# Patient Record
Sex: Male | Born: 1950 | Race: White | Hispanic: No | Smoking: Current some day smoker
Health system: Southern US, Community
[De-identification: ages and names within clinical notes are randomized; demographics above are authoritative.]

## PROBLEM LIST (undated history)

## (undated) DIAGNOSIS — I10 Essential (primary) hypertension: Secondary | ICD-10-CM

## (undated) DIAGNOSIS — R7303 Prediabetes: Secondary | ICD-10-CM

## (undated) DIAGNOSIS — M549 Dorsalgia, unspecified: Secondary | ICD-10-CM

## (undated) DIAGNOSIS — M2550 Pain in unspecified joint: Secondary | ICD-10-CM

## (undated) DIAGNOSIS — R0602 Shortness of breath: Secondary | ICD-10-CM

## (undated) DIAGNOSIS — R5383 Other fatigue: Secondary | ICD-10-CM

## (undated) DIAGNOSIS — R040 Epistaxis: Secondary | ICD-10-CM

## (undated) DIAGNOSIS — R9431 Abnormal electrocardiogram [ECG] [EKG]: Secondary | ICD-10-CM

## (undated) DIAGNOSIS — M1990 Unspecified osteoarthritis, unspecified site: Secondary | ICD-10-CM

## (undated) DIAGNOSIS — H3320 Serous retinal detachment, unspecified eye: Secondary | ICD-10-CM

## (undated) DIAGNOSIS — I517 Cardiomegaly: Secondary | ICD-10-CM

## (undated) HISTORY — DX: Dorsalgia, unspecified: M54.9

## (undated) HISTORY — DX: Epistaxis: R04.0

## (undated) HISTORY — DX: Essential (primary) hypertension: I10

## (undated) HISTORY — DX: Pain in unspecified joint: M25.50

## (undated) HISTORY — DX: Cardiomegaly: I51.7

## (undated) HISTORY — DX: Serous retinal detachment, unspecified eye: H33.20

## (undated) HISTORY — DX: Abnormal electrocardiogram (ECG) (EKG): R94.31

## (undated) HISTORY — DX: Other fatigue: R53.83

## (undated) HISTORY — DX: Unspecified osteoarthritis, unspecified site: M19.90

## (undated) HISTORY — DX: Shortness of breath: R06.02

## (undated) HISTORY — DX: Prediabetes: R73.03

---

## 2007-07-11 HISTORY — PX: CATARACT EXTRACTION: SUR2

## 2007-07-11 HISTORY — PX: EYE SURGERY: SHX253

## 2009-12-20 ENCOUNTER — Emergency Department (HOSPITAL_COMMUNITY): Admission: EM | Admit: 2009-12-20 | Discharge: 2009-12-20 | Payer: Self-pay | Admitting: Emergency Medicine

## 2009-12-21 ENCOUNTER — Ambulatory Visit: Payer: Self-pay | Admitting: Family Medicine

## 2009-12-21 DIAGNOSIS — R9431 Abnormal electrocardiogram [ECG] [EKG]: Secondary | ICD-10-CM

## 2009-12-21 DIAGNOSIS — I10 Essential (primary) hypertension: Secondary | ICD-10-CM

## 2009-12-21 DIAGNOSIS — R040 Epistaxis: Secondary | ICD-10-CM | POA: Insufficient documentation

## 2009-12-21 HISTORY — DX: Epistaxis: R04.0

## 2009-12-21 HISTORY — DX: Abnormal electrocardiogram (ECG) (EKG): R94.31

## 2009-12-21 HISTORY — DX: Essential (primary) hypertension: I10

## 2009-12-22 ENCOUNTER — Telehealth: Payer: Self-pay | Admitting: Family Medicine

## 2009-12-27 ENCOUNTER — Encounter: Payer: Self-pay | Admitting: Family Medicine

## 2009-12-27 ENCOUNTER — Ambulatory Visit (HOSPITAL_COMMUNITY): Admission: RE | Admit: 2009-12-27 | Discharge: 2009-12-27 | Payer: Self-pay | Admitting: Family Medicine

## 2009-12-27 ENCOUNTER — Ambulatory Visit: Payer: Self-pay | Admitting: Cardiology

## 2009-12-27 ENCOUNTER — Ambulatory Visit: Payer: Self-pay

## 2010-01-04 ENCOUNTER — Ambulatory Visit: Payer: Self-pay | Admitting: Family Medicine

## 2010-01-04 DIAGNOSIS — I517 Cardiomegaly: Secondary | ICD-10-CM | POA: Insufficient documentation

## 2010-01-04 HISTORY — DX: Cardiomegaly: I51.7

## 2010-01-06 ENCOUNTER — Encounter: Payer: Self-pay | Admitting: Family Medicine

## 2010-02-07 ENCOUNTER — Ambulatory Visit: Payer: Self-pay | Admitting: Family Medicine

## 2010-02-14 ENCOUNTER — Ambulatory Visit: Payer: Self-pay | Admitting: Family Medicine

## 2010-02-14 LAB — CONVERTED CEMR LAB
ALT: 29 units/L (ref 0–53)
AST: 33 units/L (ref 0–37)
Alkaline Phosphatase: 44 units/L (ref 39–117)
Basophils Relative: 0.5 % (ref 0.0–3.0)
Chloride: 105 meq/L (ref 96–112)
Eosinophils Relative: 1.3 % (ref 0.0–5.0)
HCT: 40.6 % (ref 39.0–52.0)
LDL Cholesterol: 109 mg/dL — ABNORMAL HIGH (ref 0–99)
Lymphs Abs: 2.1 10*3/uL (ref 0.7–4.0)
MCV: 90.5 fL (ref 78.0–100.0)
Monocytes Absolute: 0.7 10*3/uL (ref 0.1–1.0)
Monocytes Relative: 8.5 % (ref 3.0–12.0)
Neutrophils Relative %: 62.4 % (ref 43.0–77.0)
Nitrite: NEGATIVE
PSA: 1.05 ng/mL (ref 0.10–4.00)
Potassium: 4 meq/L (ref 3.5–5.1)
RBC: 4.49 M/uL (ref 4.22–5.81)
Total Bilirubin: 1 mg/dL (ref 0.3–1.2)
Total CHOL/HDL Ratio: 4
Urobilinogen, UA: 0.2
WBC Urine, dipstick: NEGATIVE
WBC: 7.7 10*3/uL (ref 4.5–10.5)

## 2010-02-18 ENCOUNTER — Ambulatory Visit: Payer: Self-pay | Admitting: Family Medicine

## 2010-08-06 ENCOUNTER — Encounter: Payer: Self-pay | Admitting: Family Medicine

## 2010-08-09 NOTE — Letter (Signed)
Summary: ENT-Dr. Narda Bonds  ENT-Dr. Narda Bonds   Imported By: Maryln Gottron 03/02/2010 15:18:03  _____________________________________________________________________  External Attachment:    Type:   Image     Comment:   External Document

## 2010-08-09 NOTE — Assessment & Plan Note (Signed)
Summary: cpx/njr   Vital Signs:  Patient profile:   60 year old male Height:      70.5 inches Weight:      250 pounds Temp:     98.3 degrees F oral Pulse rate:   68 / minute Pulse rhythm:   regular Resp:     16 per minute BP sitting:   130 / 88  (left arm) Cuff size:   regular  Vitals Entered By: Duard Brady LPN (February 18, 2010 11:23 AM) CC: cpx - doing well Is Patient Diabetic? No    History of Present Illness: Here for CPE.  No hx of colonoscopy. Last tetanus > 10 years ago. Exercise with walking since dx several weeks ago of severe hypertension.  Preventive Screening-Counseling & Management  Alcohol-Tobacco     Smoking Status: current  Clinical Review Panels:  Prevention   Last PSA:  1.05 (02/14/2010)  Lipid Management   Cholesterol:  169 (02/14/2010)   LDL (bad choesterol):  109 (02/14/2010)   HDL (good cholesterol):  38.30 (02/14/2010)  CBC   WBC:  7.7 (02/14/2010)   RBC:  4.49 (02/14/2010)   Hgb:  14.0 (02/14/2010)   Hct:  40.6 (02/14/2010)   Platelets:  304.0 (02/14/2010)   MCV  90.5 (02/14/2010)   MCHC  34.5 (02/14/2010)   RDW  13.4 (02/14/2010)   PMN:  62.4 (02/14/2010)   Lymphs:  27.3 (02/14/2010)   Monos:  8.5 (02/14/2010)   Eosinophils:  1.3 (02/14/2010)   Basophil:  0.5 (02/14/2010)  Complete Metabolic Panel   Glucose:  105 (02/14/2010)   Sodium:  143 (02/14/2010)   Potassium:  4.0 (02/14/2010)   Chloride:  105 (02/14/2010)   CO2:  29 (02/14/2010)   BUN:  23 (02/14/2010)   Creatinine:  1.1 (02/14/2010)   Albumin:  4.0 (02/14/2010)   Total Protein:  6.6 (02/14/2010)   Calcium:  9.1 (02/14/2010)   Total Bili:  1.0 (02/14/2010)   Alk Phos:  44 (02/14/2010)   SGPT (ALT):  29 (02/14/2010)   SGOT (AST):  33 (02/14/2010)   Allergies: 1)  Penicillin V Potassium (Penicillin V Potassium)  Past History:  Past Medical History: Last updated: 01/04/2010 Hay fever/ Allergies HBP LVH by ECHO 6/11  Past Surgical History: Last  updated: 12/21/2009 Cataract 2009  Family History: Last updated: 12/21/2009 Paternal grendmother, ovarian CA Mother, bladder CA, hypertension Father, hypertension, heart disease  36s Both grandfathers, heart disease, hypertension  Social History: Last updated: 12/21/2009 Occupation:  Systems developer Married Alcohol use-yes Regular exercise-yes Quit smoking  Risk Factors: Exercise: yes (12/21/2009)  Risk Factors: Smoking Status: current (02/18/2010)  Social History: Smoking Status:  current  Review of Systems  The patient denies anorexia, fever, weight gain, vision loss, decreased hearing, hoarseness, chest pain, syncope, dyspnea on exertion, peripheral edema, prolonged cough, headaches, hemoptysis, abdominal pain, melena, hematochezia, severe indigestion/heartburn, hematuria, incontinence, genital sores, muscle weakness, suspicious skin lesions, transient blindness, difficulty walking, depression, unusual weight change, abnormal bleeding, enlarged lymph nodes, and testicular masses.         some weight loss due to his efforts.  Physical Exam  General:  Well-developed,well-nourished,in no acute distress; alert,appropriate and cooperative throughout examination Head:  Normocephalic and atraumatic without obvious abnormalities. No apparent alopecia or balding. Eyes:  No corneal or conjunctival inflammation noted. EOMI. Perrla. Funduscopic exam benign, without hemorrhages, exudates or papilledema. Vision grossly normal. Ears:  External ear exam shows no significant lesions or deformities.  Otoscopic examination reveals clear canals, tympanic membranes are intact bilaterally  without bulging, retraction, inflammation or discharge. Hearing is grossly normal bilaterally. Mouth:  Oral mucosa and oropharynx without lesions or exudates.  Teeth in good repair. Neck:  No deformities, masses, or tenderness noted. Lungs:  Normal respiratory effort, chest expands symmetrically. Lungs are  clear to auscultation, no crackles or wheezes. Heart:  Normal rate and regular rhythm. S1 and S2 normal without gallop, murmur, click, rub or other extra sounds. Abdomen:  Bowel sounds positive,abdomen soft and non-tender without masses, organomegaly or hernias noted. Rectal:  No external abnormalities noted. Normal sphincter tone. No rectal masses or tenderness. Genitalia:  Testes bilaterally descended without nodularity, tenderness or masses. No scrotal masses or lesions. No penis lesions or urethral discharge. Prostate:  Prostate gland firm and smooth, no enlargement, nodularity, tenderness, mass, asymmetry or induration. Msk:  No deformity or scoliosis noted of thoracic or lumbar spine.   Extremities:  No clubbing, cyanosis, edema, or deformity noted with normal full range of motion of all joints.   Neurologic:  alert & oriented X3 and cranial nerves II-XII intact.   Skin:  Intact without suspicious lesions or rashes Cervical Nodes:  No lymphadenopathy noted Psych:  Oriented X3, good eye contact, not anxious appearing, and not depressed appearing.     Impression & Recommendations:  Problem # 1:  Preventive Health Care (ICD-V70.0) labs reviewed with pt.  Tdap given.  Discussed and recommended colonoscopy screening.  He is not sure at this time because of cost. He does agree to hemoccult cards and these are given.  Cont weight loss efforts. His BP has improved.  Complete Medication List: 1)  Metoprolol Tartrate 50 Mg Tabs (Metoprolol tartrate) .... Once daily 2)  Hydrochlorothiazide 25 Mg Tabs (Hydrochlorothiazide) .... Once daily 3)  Lisinopril 20 Mg Tabs (Lisinopril) .... One by mouth once daily  Other Orders: Tdap => 7yrs IM (04540) Admin 1st Vaccine (98119)  Patient Instructions: 1)  Please schedule a follow-up appointment in 6 months .  2)  It is important that you exercise reguarly at least 20 minutes 5 times a week. If you develop chest pain, have severe difficulty breathing,  or feel very tired, stop exercising immediately and seek medical attention.  3)  You need to lose weight. Consider a lower calorie diet and regular exercise.  4)  Check your  Blood Pressure regularly . If it is above:140/90   you should make an appointment.    Immunizations Administered:  Tetanus Vaccine:    Vaccine Type: Tdap    Site: right deltoid    Mfr: Gl[employer]oSmithK[employer]    Dose: 0.5 ml    Route: IM    Given by: Duard Brady LPN    Exp. Date: 01/07/2012    Lot #: [id number]    VIS given: 05/28/07 version given February 18, 2010.    Physician counseled: yes

## 2010-08-09 NOTE — Assessment & Plan Note (Signed)
Summary: 2 wks rov/mm   Vital Signs:  Patient profile:   60 year old male Temp:     98.0 degrees F oral BP sitting:   170 / 100  (left arm) Cuff size:   large  Vitals Entered By: Sid Falcon LPN (January 04, 2010 11:01 AM)  Serial Vital Signs/Assessments:  Time      Position  BP       Pulse  Resp  Temp     By                     162/94                         Evelena Peat MD  CC: 2 week F/U on new BP meds   History of Present Illness: Follow up hypertension. Refer to recent note.  Pt presented with severe hypertension, EKG changes with prob LVH and repolarization changes.  Also freq posterior nosebleeds.  Pt started on HCTZ and metoprolol prior to being seen here.  We added lisinopril last visit. ECHO revealed LVH changes but no signif  valvular problems or wall motion abnormalities.  Pt denies chest pain at this time.  compliant with meds with no side effects. No cough.  No recent nose bleeds.  holding ASA at this time.  Saw ENT and no site of nose bleed located. [place].  Has started some walking as has scaled back NSAID and ETOH use.  Allergies: 1)  Penicillin V Potassium (Penicillin V Potassium)  Past History:  Past Surgical History: Last updated: 12/21/2009 Cataract 2009  Family History: Last updated: 12/21/2009 Paternal grendmother, ovarian CA Mother, bladder CA, hypertension Father, hypertension, heart disease  42s Both grandfathers, heart disease, hypertension  Social History: Last updated: 12/21/2009 Occupation:  Systems developer Married Alcohol use-yes Regular exercise-yes Quit smoking  Risk Factors: Exercise: yes (12/21/2009)  Past Medical History: Hay fever/ Allergies HBP LVH by ECHO 6/11 PMH-FH-SH reviewed for relevance  Review of Systems  The patient denies weight loss, weight gain, chest pain, syncope, dyspnea on exertion, peripheral edema, prolonged cough, headaches, hemoptysis, abdominal  pain, melena, hematochezia, and severe indigestion/heartburn.    Physical Exam  General:  Well-developed,well-nourished,in no acute distress; alert,appropriate and cooperative throughout examination Nose:  External nasal examination shows no deformity or inflammation. Nasal mucosa are pink and moist without lesions or exudates. Neck:  No deformities, masses, or tenderness noted. Lungs:  Normal respiratory effort, chest expands symmetrically. Lungs are clear to auscultation, no crackles or wheezes. Heart:  Normal rate and regular rhythm. S1 and S2 normal without gallop, murmur, click, rub or other extra sounds. Extremities:  no edema.   Impression & Recommendations:  Problem # 1:  ESSENTIAL HYPERTENSION (ICD-401.9) improved but not to goal.  Titrate lisinopril to 20 mg once daily and reasess in 3 weeks and check BMP then. His updated medication list for this problem includes:    Metoprolol Tartrate 50 Mg Tabs (Metoprolol tartrate) ..... Once daily    Hydrochlorothiazide 25 Mg Tabs (Hydrochlorothiazide) ..... Once daily    Lisinopril 20 Mg Tabs (Lisinopril) ..... 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  Complete Medication List: 1)  Metoprolol Tartrate 50 Mg Tabs (Metoprolol tartrate) .... Once daily 2)  Hydrochlorothiazide 25 Mg Tabs (Hydrochlorothiazide) .Marland KitchenMarland KitchenMarland Kitchen  Once daily 3)  Lisinopril 20 Mg Tabs (Lisinopril) .... One by mouth once daily   Patient Instructions: 1)  Increase lisinopril to 20 mg once daily  2)  It is important that you exercise reguarly at least 20 minutes 5 times a week. If you develop chest pain, have severe difficulty breathing, or feel very tired, stop exercising immediately and seek medical attention.  3)  You need to lose weight. Consider a lower calorie diet and regular exercise.    4)  Consider getting back on Aspirin 81 mg daily when OKed by Dr Ezzard Standing. 5)  Please schedule a follow-up appointment in 1 month.  Prescriptions: LISINOPRIL 20 MG TABS (LISINOPRIL) one by mouth once daily  #30 x 11   Entered and Authorized by:   Evelena Peat MD   Signed by:   Evelena Peat MD on 01/04/2010   Method used:   Electronically to        [facility] [address] [phone]* (retail)       [address]       [place], [place]  [address]       Ph: [phone] or [phone]       Fax: [phone]   RxID:   [id number]

## 2010-08-09 NOTE — Progress Notes (Signed)
Summary: BP 230/118  Phone Note Call from Patient   Caller: Patient Call For: Evelena Peat MD Summary of Call: Pt. called to let Dr. Caryl Never know he has had 3 more nose bleeds, 2 were short lived, and one was the same as before.  He still feels fine...no dizziness, sob, etc........ [phone] Initial call taken by: Lynann Beaver CMA,  December 22, 2009 9:39 AM  Follow-up for Phone Call        Pt called back, and his nose has just started bleeding again. Asking if he should come to the office? Follow-up by: Lynann Beaver CMA,  December 22, 2009 11:30 AM  Additional Follow-up for Phone Call Additional follow up Details #1::        make sure he has STOPPED aspirin.  If nose bleed not stoppping with pressure I think we need to get him into ENT since this was a posterior bleed which tend to be much more difficult to manage. Additional Follow-up by: Evelena Peat MD,  December 22, 2009 11:45 AM    Additional Follow-up for Phone Call Additional follow up Details #2::    Yes, has stopped the ASA.  BP this am at nursing home with his mother, trying to decide about hospitalizing her & stress with that, 238/118.  Has had the one nosebleed today & wants tp gp ahead with ENT because he feels the blood towards back & draining into throat more the nose.  HCTZ & Metoprolol for BP.   Follow-up by: Rudy Jew, RN,  December 22, 2009 12:21 PM  Additional Follow-up for Phone Call Additional follow up Details #3:: Details for Additional Follow-up Action Taken: At this point, I would add lisinopril 10 mg by mouth once daily and proceed with ENT referral. Make sure pt has f/u in 2 weeks. Additional Follow-up by: Evelena Peat MD,  December 22, 2009 5:31 PM  New/Updated Medications: LISINOPRIL 10 MG TABS (LISINOPRIL) One daily Prescriptions: LISINOPRIL 10 MG TABS (LISINOPRIL) One daily  #30 x 0   Entered by:   Rudy Jew, RN   Authorized by:   Evelena Peat MD   Signed by:   Rudy Jew,  RN on 12/22/2009   Method used:   Electronically to        [facility] [address] [phone]* (retail)       [address]       [place], [place]  [address]       Ph: [phone] or [phone]       Fax: [phone]   RxID:   [id number]

## 2010-08-09 NOTE — Assessment & Plan Note (Signed)
Summary: BRAND NEW PT/TO EST/SEVERE NOSE BLEEDS/HIGH BP/CJR   Vital Signs:  Patient profile:   60 year old male Height:      70.50 inches Weight:      256 pounds BMI:     36.34 Temp:     98.6 degrees F oral Pulse rate:   72 / minute Pulse rhythm:   regular Resp:     12 per minute BP sitting:   162 / 98  (left arm) Cuff size:   large  Vitals Entered By: Sid Falcon LPN (December 21, 2009 11:20 AM)  Nutrition Counseling: Patient's BMI is greater than 25 and therefore counseled on weight management options.  Serial Vital Signs/Assessments:  Time      Position  BP       Pulse  Resp  Temp     By 11:38 AM            158/92                         Sid Falcon LPN  CC: New to establish,  nose bleeds, HBP   History of Present Illness: New pt to establish care.  Patient relates hx of high blood pressure off and on for years but never treated with meds until recently.  Recent trip to [place] and had signif L nose bleed.  Went to ER in [place] and had nose packed and BP noted to be up but pt not placed on any meds.  BP up in ER in 200/100 range and EKG done which showed some repolarization changes and possible LVH changes.   Pt returned home and over past weekend recurrent L nose bleed and pt had Bp 230/100.  Nosebleed was brief.  No recurrent packing.  Pt reportedly given Clonidine in ER (here in [place]) and started on HCTZ and Metoprolol.  Bp has improved since starting those meds.  No further bleeds since then.  Denies headaches, dizziness, chest pains, dypnea.  Taking 81 mg ASA daily.  Nonsmoker.  Strong FH of hypertension.  Drinks 1-2 alcoholic drinks per day.   Preventive Screening-Counseling & Management  Caffeine-Diet-Exercise     Does Patient Exercise: yes  Allergies (verified): 1)  Penicillin V Potassium (Penicillin V Potassium)  Past History:  Family History: Last updated: 12/21/2009 Paternal grendmother, ovarian CA Mother, bladder CA,  hypertension Father, hypertension, heart disease  51s Both grandfathers, heart disease, hypertension  Social History: Last updated: 12/21/2009 Occupation:  Systems developer Married Alcohol use-yes Regular exercise-yes Quit smoking  Risk Factors: Exercise: yes (12/21/2009)  Past Medical History: Hay fever/ Allergies HBP  Past Surgical History: Cataract 2009  Family History: Paternal grendmother, ovarian CA Mother, bladder CA, hypertension Father, hypertension, heart disease  28s Both grandfathers, heart disease, hypertension  Social History: Occupation:  Systems developer Married Alcohol use-yes Regular exercise-yes Quit smoking Occupation:  employed Does Patient Exercise:  yes  Review of Systems  The patient denies anorexia, fever, weight loss, weight gain, vision loss, chest pain, syncope, dyspnea on exertion, peripheral edema, prolonged cough, headaches, hemoptysis, abdominal pain, severe indigestion/heartburn, and muscle weakness.    Physical Exam  General:  pt is alert, cooperative and moderately obese. Eyes:  No corneal or conjunctival inflammation noted. EOMI. Perrla. Funduscopic exam benign, without hemorrhages, exudates or papilledema. Vision grossly normal. Ears:  External ear exam shows no significant lesions or deformities.  Otoscopic examination reveals clear canals, tympanic membranes are intact bilaterally without bulging, retraction, inflammation or discharge. Hearing  is grossly normal bilaterally. Nose:  no clotting or active bleeding. Mouth:  Oral mucosa and oropharynx without lesions or exudates.  Teeth in good repair. Neck:  No deformities, masses, or tenderness noted. Lungs:  Normal respiratory effort, chest expands symmetrically. Lungs are clear to auscultation, no crackles or wheezes. Heart:  Normal rate and regular rhythm. S1 and S2 normal without gallop, murmur, click, rub or other extra sounds. Extremities:  no edema. Neurologic:  alert &  oriented X3 and cranial nerves II-XII intact.   Psych:  normally interactive, good eye contact, not anxious appearing, and not depressed appearing.     Impression & Recommendations:  Problem # 1:  ESSENTIAL HYPERTENSION (ICD-401.9) Assessment New improved since starting med just 2 days ago but not to goal.  REassess in 2 weeks. His updated medication list for this problem includes:    Metoprolol Tartrate 50 Mg Tabs (Metoprolol tartrate) ..... Once daily    Hydrochlorothiazide 25 Mg Tabs (Hydrochlorothiazide) ..... Once daily  Problem # 2:  EPISTAXIS, RECURRENT (ICD-784.7) no bleeding at this time.  Hold ASA for one week.  Prob post nose bleed. Consider ENT referral if recurs.  Problem # 3:  ABNORMAL ELECTROCARDIOGRAM (ICD-794.31) ?LVH by EKG with repolarization changes.  Will check Echo. Orders: Echo Referral (Echo)  Complete Medication List: 1)  Metoprolol Tartrate 50 Mg Tabs (Metoprolol tartrate) .... Once daily 2)  Hydrochlorothiazide 25 Mg Tabs (Hydrochlorothiazide) .... Once daily  Patient Instructions: 1)  Hold aspirin for now. 2)  Schedule follow up in 2 weeks. 3)  Call for any recurrent nose bleeds. Prescriptions: HYDROCHLOROTHIAZIDE 25 MG TABS (HYDROCHLOROTHIAZIDE) once daily  #30 x 5   Entered and Authorized by:   Evelena Peat MD   Signed by:   Evelena Peat MD on 12/21/2009   Method used:   Electronically to        [facility] [address] [phone]* (retail)       [address]       [place], [place]  [address]       Ph: [phone] or [phone]       Fax: [phone]   RxID:   [id number] METOPROLOL TARTRATE 50 MG TABS (METOPROLOL TARTRATE) once daily  #30 x 5   Entered and Authorized by:   Evelena Peat MD   Signed by:   Evelena Peat MD on 12/21/2009   Method used:   Electronically to        [facility] [address] [phone]* (retail)       [address]       [place], [place]  [address]       Ph: [phone]  or [phone]       Fax: [phone]   RxID:   [id number]

## 2010-08-09 NOTE — Assessment & Plan Note (Signed)
Summary: Follow up/cb   Vital Signs:  Patient profile:   60 year old male Temp:     98.0 degrees F oral BP sitting:   120 / 84  (left arm) Cuff size:   large  Vitals Entered By: Sid Falcon LPN (February 07, 2010 11:06 AM) CC: one month follow-up   History of Present Illness: Patient here for followup hypertension and left ventricular hypertrophy. Overall feels much better. Sleeping is improved. Denies any dizziness or chest pains. Tolerating medications without side effects. Compliant with all medications.  No further nose bleeds since onset several years ago.  Nonsmoker.  Not exercising regularly.  Allergies: 1)  Penicillin V Potassium (Penicillin V Potassium)  Past History:  Past Medical History: Last updated: 01/04/2010 Hay fever/ Allergies HBP LVH by ECHO 6/11 PMH reviewed for relevance  Review of Systems  The patient denies chest pain, syncope, dyspnea on exertion, peripheral edema, prolonged cough, and headaches.    Physical Exam  General:  Well-developed,well-nourished,in no acute distress; alert,appropriate and cooperative throughout examination Neck:  No deformities, masses, or tenderness noted. Lungs:  Normal respiratory effort, chest expands symmetrically. Lungs are clear to auscultation, no crackles or wheezes. Heart:  Normal rate and regular rhythm. S1 and S2 normal without gallop, murmur, click, rub or other extra sounds. Extremities:  no edema.   Impression & Recommendations:  Problem # 1:  ESSENTIAL HYPERTENSION (ICD-401.9) Assessment Improved cont current meds and work on weight loss and establishing more regular exercise.  Check labs in 4 months including lipids. His updated medication list for this problem includes:    Metoprolol Tartrate 50 Mg Tabs (Metoprolol tartrate) ..... Once daily    Hydrochlorothiazide 25 Mg Tabs (Hydrochlorothiazide) ..... Once daily    Lisinopril 20 Mg Tabs (Lisinopril) ..... One by mouth once daily  Complete  Medication List: 1)  Metoprolol Tartrate 50 Mg Tabs (Metoprolol tartrate) .... Once daily 2)  Hydrochlorothiazide 25 Mg Tabs (Hydrochlorothiazide) .... Once daily 3)  Lisinopril 20 Mg Tabs (Lisinopril) .... One by mouth once daily  Patient Instructions: 1)  Please schedule a follow-up appointment in 4 months .  2)  BMP prior to visit, ICD-9: 401.9 3)  Hepatic Panel prior to visit ICD-9: 272.4 4)  Lipid panel prior to visit ICD-9 : 272.4 5)  It is important that you exercise reguarly at least 20 minutes 5 times a week. If you develop chest pain, have severe difficulty breathing, or feel very tired, stop exercising immediately and seek medical attention.  6)  You need to lose weight. Consider a lower calorie diet and regular exercise.  7)  Check your  Blood Pressure regularly . If it is above:140/90   you should make an appointment.

## 2010-08-22 ENCOUNTER — Encounter: Payer: Self-pay | Admitting: Family Medicine

## 2010-08-22 ENCOUNTER — Ambulatory Visit (INDEPENDENT_AMBULATORY_CARE_PROVIDER_SITE_OTHER): Payer: 59 | Admitting: Family Medicine

## 2010-08-22 DIAGNOSIS — R7309 Other abnormal glucose: Secondary | ICD-10-CM

## 2010-08-22 DIAGNOSIS — I10 Essential (primary) hypertension: Secondary | ICD-10-CM

## 2010-08-22 DIAGNOSIS — R5381 Other malaise: Secondary | ICD-10-CM

## 2010-08-22 DIAGNOSIS — R7303 Prediabetes: Secondary | ICD-10-CM

## 2010-08-22 DIAGNOSIS — R5383 Other fatigue: Secondary | ICD-10-CM

## 2010-08-22 DIAGNOSIS — I517 Cardiomegaly: Secondary | ICD-10-CM

## 2010-08-22 NOTE — Progress Notes (Signed)
  Subjective:    Patient ID: Samuel [name], male    DOB: Nov 16, 1950, 60 y.o.   MRN: [id number]  HPI  Patient seen for followup of hypertension and left ventricular hypertrophy. He presented last year with severe nosebleeds but none since then. Medications are reviewed. He remains on lisinopril, hydrochlorothiazide, and metoprolol. Blood pressures have been well controlled by home readings. Walks about 1 mile per day. Minimal weight loss since last year. Former smoker. Continues to drink about 2 occasionally more alcoholic beverages per day.     patient has concerns of possible hypogonadism. Low stamina and frequent fatigue. Occasional low libido.    patient has history of prediabetes by prior lab work at complete physical in August. He would like to have this reassessed. No symptoms of hyperglycemia. Modest weight loss since physical.   Review of Systems  patient denies any chest pains, dizziness, dyspnea with exertion, peripheral edema, headaches, palpitations , prolonged cough or any change in urine or stool habits.      Objective:   Physical Exam     Patient is alert and in no distress  oropharynx is clear Neck no mass  Chest good auscultation Heart regular rhythm and rate with no murmur Extremities no edema Neuro exam is nonfocal. Cranial nerves II through XII intact.    Assessment & Plan:   #1 hypertension adequately controlled  #2 obesity  #3 history of left ventricular hypertrophy  #4 history of  Pre- diabetes #5 fatigue , rule out hypogonadism

## 2010-08-29 ENCOUNTER — Other Ambulatory Visit: Payer: 59

## 2010-08-29 ENCOUNTER — Other Ambulatory Visit: Payer: Self-pay | Admitting: Family Medicine

## 2010-08-29 ENCOUNTER — Encounter (INDEPENDENT_AMBULATORY_CARE_PROVIDER_SITE_OTHER): Payer: Self-pay | Admitting: *Deleted

## 2010-08-29 DIAGNOSIS — Z0000 Encounter for general adult medical examination without abnormal findings: Secondary | ICD-10-CM

## 2010-08-31 NOTE — Progress Notes (Signed)
Quick Note:  Attempt to call tp - VM on cell# - left msg to call if questions - gave Dr. Mar Daring' instructions r/t testosterone level. ______

## 2011-01-02 DIAGNOSIS — D229 Melanocytic nevi, unspecified: Secondary | ICD-10-CM

## 2011-01-02 HISTORY — DX: Melanocytic nevi, unspecified: D22.9

## 2011-01-07 ENCOUNTER — Other Ambulatory Visit: Payer: Self-pay | Admitting: Family Medicine

## 2011-02-20 ENCOUNTER — Encounter: Payer: Self-pay | Admitting: Podiatry

## 2011-02-20 ENCOUNTER — Encounter: Payer: Self-pay | Admitting: Family Medicine

## 2011-02-20 ENCOUNTER — Ambulatory Visit (INDEPENDENT_AMBULATORY_CARE_PROVIDER_SITE_OTHER): Payer: 59 | Admitting: Family Medicine

## 2011-02-20 DIAGNOSIS — E785 Hyperlipidemia, unspecified: Secondary | ICD-10-CM

## 2011-02-20 DIAGNOSIS — I517 Cardiomegaly: Secondary | ICD-10-CM

## 2011-02-20 DIAGNOSIS — I10 Essential (primary) hypertension: Secondary | ICD-10-CM

## 2011-02-20 DIAGNOSIS — R7303 Prediabetes: Secondary | ICD-10-CM

## 2011-02-20 DIAGNOSIS — R7309 Other abnormal glucose: Secondary | ICD-10-CM

## 2011-02-20 LAB — LIPID PANEL
Cholesterol: 170 mg/dL (ref 0–200)
VLDL: 18.8 mg/dL (ref 0.0–40.0)

## 2011-02-20 LAB — BASIC METABOLIC PANEL
BUN: 15 mg/dL (ref 6–23)
CO2: 28 mEq/L (ref 19–32)
Chloride: 99 mEq/L (ref 96–112)
Glucose, Bld: 117 mg/dL — ABNORMAL HIGH (ref 70–99)
Potassium: 3.9 mEq/L (ref 3.5–5.1)
Sodium: 137 mEq/L (ref 135–145)

## 2011-02-20 NOTE — Progress Notes (Signed)
  Subjective:    Patient ID: Samuel [name], male    DOB: 07-07-1951, 60 y.o.   MRN: [id number]  HPI Medical followup. History of hypertension, left ventricular hypertrophy and prediabetes by labs last year. Excellent job with weight loss. Approximately 256 pounds one year ago now 230 pounds. Walking 1 hour most days of week. Overall feels much better. No dizziness. Denies any chest pains or dyspnea. Medications reviewed. Compliant with all. No side effects. No symptoms of hyperglycemia. Patient is nonsmoker. Did smoke previously and quit 1990.  Frequent epistaxis at dx of hypertension and none since controlled.  Past Medical History  Diagnosis Date  . ESSENTIAL HYPERTENSION 12/21/2009  . VENTRICULAR HYPERTROPHY, LEFT 01/04/2010  . EPISTAXIS, RECURRENT 12/21/2009  . ABNORMAL ELECTROCARDIOGRAM 12/21/2009   Past Surgical History  Procedure Date  . Eye surgery 2009    catarac    reports that he quit smoking about 22 years ago. He does not have any smokeless tobacco history on file. He reports that he drinks alcohol. His drug history not on file. family history includes Cancer in his mother and paternal grandmother; Heart disease in his father, maternal grandfather, and paternal grandfather; and Hypertension in his father, maternal grandfather, mother, and paternal grandfather. Allergies  Allergen Reactions  . Penicillins     REACTION: hives      Review of Systems  Constitutional: Negative for appetite change and fatigue.  Eyes: Negative for visual disturbance.  Respiratory: Negative for cough, chest tightness and shortness of breath.   Cardiovascular: Negative for chest pain, palpitations and leg swelling.  Neurological: Negative for dizziness, syncope, weakness, light-headedness and headaches.  Psychiatric/Behavioral: Negative for dysphoric mood.       Objective:   Physical Exam  Constitutional: He is oriented to person, place, and time. He appears well-developed and well-nourished.    HENT:  Mouth/Throat: Oropharynx is clear and moist.  Eyes: Pupils are equal, round, and reactive to light.  Neck: Normal range of motion. Neck supple. No thyromegaly present.  Cardiovascular: Normal rate, regular rhythm and normal heart sounds.   No murmur heard. Pulmonary/Chest: Effort normal and breath sounds normal. No respiratory distress. He has no wheezes. He has no rales.  Musculoskeletal: He exhibits no edema.  Lymphadenopathy:    He has no cervical adenopathy.  Neurological: He is alert and oriented to person, place, and time.  Psychiatric: He has a normal mood and affect. His behavior is normal.          Assessment & Plan:  #1 hypertension greatly improved. Continue current medications. Check basic metabolic panel #2 history of prediabetes by previous lab work. Hopefully improved with weight loss. Recheck fasting blood sugar today #3 history of left ventricular hypertrophy no dyspnea or other concerning symptoms. #4 hyperlipidemia. Recheck lipids

## 2011-02-21 NOTE — Progress Notes (Signed)
Quick Note:  Pt informed, copy mailed to pt home upon request ______

## 2011-05-07 ENCOUNTER — Other Ambulatory Visit: Payer: Self-pay | Admitting: Family Medicine

## 2011-05-21 ENCOUNTER — Other Ambulatory Visit: Payer: Self-pay | Admitting: Family Medicine

## 2011-05-22 ENCOUNTER — Other Ambulatory Visit: Payer: Self-pay | Admitting: *Deleted

## 2011-05-22 MED ORDER — HYDROCHLOROTHIAZIDE 25 MG PO TABS: 25.0000 mg | ORAL_TABLET | Freq: Every day | ORAL | Status: DC

## 2011-05-22 MED ORDER — METOPROLOL TARTRATE 50 MG PO TABS: 50.0000 mg | ORAL_TABLET | Freq: Every day | ORAL | Status: DC

## 2011-08-23 ENCOUNTER — Ambulatory Visit: Payer: 59 | Admitting: Family Medicine

## 2012-04-28 ENCOUNTER — Other Ambulatory Visit: Payer: Self-pay | Admitting: Family Medicine

## 2012-05-30 ENCOUNTER — Other Ambulatory Visit: Payer: Self-pay | Admitting: Family Medicine

## 2012-06-29 ENCOUNTER — Other Ambulatory Visit: Payer: Self-pay | Admitting: Family Medicine

## 2012-09-29 ENCOUNTER — Other Ambulatory Visit: Payer: Self-pay | Admitting: Family Medicine

## 2012-10-26 ENCOUNTER — Other Ambulatory Visit: Payer: Self-pay | Admitting: Family Medicine

## 2012-10-28 ENCOUNTER — Encounter: Payer: Self-pay | Admitting: *Deleted

## 2012-10-31 ENCOUNTER — Other Ambulatory Visit: Payer: Self-pay | Admitting: Family Medicine

## 2012-11-03 ENCOUNTER — Other Ambulatory Visit: Payer: Self-pay | Admitting: Family Medicine

## 2012-11-11 ENCOUNTER — Other Ambulatory Visit: Payer: Self-pay | Admitting: Family Medicine

## 2012-11-18 ENCOUNTER — Ambulatory Visit (INDEPENDENT_AMBULATORY_CARE_PROVIDER_SITE_OTHER): Payer: BC Managed Care – PPO | Admitting: Family Medicine

## 2012-11-18 ENCOUNTER — Encounter: Payer: Self-pay | Admitting: Family Medicine

## 2012-11-18 VITALS — BP 110/70 | Temp 99.4°F | Wt 246.0 lb

## 2012-11-18 DIAGNOSIS — Z125 Encounter for screening for malignant neoplasm of prostate: Secondary | ICD-10-CM

## 2012-11-18 DIAGNOSIS — E785 Hyperlipidemia, unspecified: Secondary | ICD-10-CM

## 2012-11-18 DIAGNOSIS — I10 Essential (primary) hypertension: Secondary | ICD-10-CM

## 2012-11-18 NOTE — Progress Notes (Signed)
  Subjective:    Patient ID: Samuel [name], male    DOB: January 28, 1951, 62 y.o.   MRN: [id number]  HPI  Followup hypertension Patient takes 3 drug regimen of lisinopril, HCTZ, and metoprolol. When first started several years ago he had some LVH. No chest pains. No dyspnea. Compliant with medications. No consistent exercise. He denies any medication side effects.  Past Medical History  Diagnosis Date  . ESSENTIAL HYPERTENSION 12/21/2009  . VENTRICULAR HYPERTROPHY, LEFT 01/04/2010  . EPISTAXIS, RECURRENT 12/21/2009  . ABNORMAL ELECTROCARDIOGRAM 12/21/2009   Past Surgical History  Procedure Laterality Date  . Eye surgery  2009    catarac    reports that he quit smoking about 24 years ago. He does not have any smokeless tobacco history on file. He reports that  drinks alcohol. His drug history is not on file. family history includes Cancer in his mother and paternal grandmother; Heart disease in his father, maternal grandfather, and paternal grandfather; and Hypertension in his father, maternal grandfather, mother, and paternal grandfather. Allergies  Allergen Reactions  . Penicillins     REACTION: hives     Review of Systems  Constitutional: Negative for fatigue.  Eyes: Negative for visual disturbance.  Respiratory: Negative for cough, chest tightness and shortness of breath.   Cardiovascular: Negative for chest pain, palpitations and leg swelling.  Neurological: Negative for dizziness, syncope, weakness, light-headedness and headaches.       Objective:   Physical Exam  Constitutional: He appears well-developed and well-nourished.  Cardiovascular: Normal rate and regular rhythm.   Pulmonary/Chest: Effort normal and breath sounds normal. No respiratory distress. He has no wheezes. He has no rales.  Musculoskeletal: He exhibits no edema.          Assessment & Plan:  Hypertension. Well controlled. Future labs ordered with basic metabolic panel and lipid panel. He is encouraged  to consider complete physical at some point this year

## 2012-11-22 ENCOUNTER — Other Ambulatory Visit (INDEPENDENT_AMBULATORY_CARE_PROVIDER_SITE_OTHER): Payer: BC Managed Care – PPO

## 2012-11-22 DIAGNOSIS — Z125 Encounter for screening for malignant neoplasm of prostate: Secondary | ICD-10-CM

## 2012-11-22 DIAGNOSIS — E785 Hyperlipidemia, unspecified: Secondary | ICD-10-CM

## 2012-11-22 DIAGNOSIS — I10 Essential (primary) hypertension: Secondary | ICD-10-CM

## 2012-11-22 LAB — PSA: PSA: 1.44 ng/mL (ref 0.10–4.00)

## 2012-11-22 LAB — LIPID PANEL
Cholesterol: 177 mg/dL (ref 0–200)
HDL: 41.9 mg/dL (ref >39.00)
LDL Cholesterol: 117 mg/dL — ABNORMAL HIGH (ref 0–99)
VLDL: 17.8 mg/dL (ref 0.0–40.0)

## 2012-11-22 LAB — HEPATIC FUNCTION PANEL
ALT: 34 U/L (ref 0–53)
Bilirubin, Direct: 0.2 mg/dL (ref 0.0–0.3)
Total Bilirubin: 1.2 mg/dL (ref 0.3–1.2)
Total Protein: 6.8 g/dL (ref 6.0–8.3)

## 2012-11-22 LAB — BASIC METABOLIC PANEL
BUN: 15 mg/dL (ref 6–23)
Chloride: 104 mEq/L (ref 96–112)
GFR: 79.22 mL/min (ref >60.00)
Potassium: 4.3 mEq/L (ref 3.5–5.1)
Sodium: 139 mEq/L (ref 135–145)

## 2012-11-25 NOTE — Progress Notes (Signed)
Quick Note:  Pt informed on personally identified VM ______ 

## 2012-12-08 ENCOUNTER — Other Ambulatory Visit: Payer: Self-pay | Admitting: Family Medicine

## 2013-01-06 ENCOUNTER — Other Ambulatory Visit: Payer: Self-pay | Admitting: Family Medicine

## 2013-01-27 ENCOUNTER — Other Ambulatory Visit: Payer: Self-pay | Admitting: Family Medicine

## 2013-02-08 ENCOUNTER — Other Ambulatory Visit: Payer: Self-pay | Admitting: Family Medicine

## 2013-06-10 ENCOUNTER — Other Ambulatory Visit: Payer: Self-pay | Admitting: Family Medicine

## 2013-07-14 ENCOUNTER — Telehealth: Payer: Self-pay | Admitting: Family Medicine

## 2013-07-14 NOTE — Telephone Encounter (Signed)
Patient Information:  Caller Name: [place]  Phone: [phone]  Patient: Samuel [name]  Gender: Male  DOB: September 12, 1950  Age: 63 Years  PCP: Carolann Littler [facility])  Office Follow Up:  Does the office need to follow up with this patient?: No  Instructions For The Office: N/A   Symptoms  Reason For Call & Symptoms: Pt has low back pain. Onset 2 days ago.  He has had to lift their 30lb family dog and carry him down the stairs due to a procedure. He does have along hx of "throwing his back out" every couple of months with extra activity. This am he had a severe pain in his low back, right hand side after he moved a certain way but with Motrin 600mg  it went to a mild discomfort..  Reviewed Health History In EMR: Yes  Reviewed Medications In EMR: Yes  Reviewed Allergies In EMR: Yes  Reviewed Surgeries / Procedures: Yes  Date of Onset of Symptoms: 07/12/2013  Treatments Tried: Motrin 600mg   Treatments Tried Worked: No  Guideline(s) Used:  Back Pain  Disposition Per Guideline:   Home Care  Reason For Disposition Reached:   Back pain  Advice Given:  Reassurance:  With treatment, the pain most often goes away in 1-2 weeks.  You can treat most back pain at home.  Cold or Heat:  Heat Pack: If pain lasts over 2 days, apply heat to the sore area. Use a heat pack, heating pad, or warm wet washcloth. Do this for 10 minutes, then as needed. For widespread stiffness, take a hot bath or hot shower instead. Move the sore area under the warm water.  Activity  Keep doing your day-to-day activities if it is not too painful. Staying active is better than resting.  Avoid anything that makes your pain worse. Avoid heavy lifting, twisting, and too much exercise until your back heals.  You do not need to stay in bed.  Sleep:  Sleep on your side with a pillow between your knees. If you sleep on your back, put a pillow under your knees.  Avoid sleeping on your stomach.  Pain Medicines:  For  pain relief, take acetaminophen, ibuprofen, or naproxen.  Prevention:  The only way to prevent future backaches is to keep your back muscles in excellent physical condition.  Call Back If:  You have more questions  You become worse.  Patient Will Follow Care Advice:  YES

## 2013-07-15 ENCOUNTER — Telehealth: Payer: Self-pay | Admitting: Family Medicine

## 2013-07-15 NOTE — Telephone Encounter (Signed)
Pt scheduled for thurs at 10:45 am

## 2013-07-15 NOTE — Telephone Encounter (Signed)
Let's see if we can get him in tomorrow.

## 2013-07-15 NOTE — Telephone Encounter (Signed)
Can you please the patient and see if he can come in on Thursday or Friday. Thank you

## 2013-07-15 NOTE — Telephone Encounter (Signed)
Patient Information:  Caller Name: [place]  Phone: [phone]  Patient: Samuel [name]  Gender: Male  DOB: 1950-12-17  Age: 63 Years  PCP: Carolann Littler [facility])  Office Follow Up:  Does the office need to follow up with this patient?: Yes  Instructions For The Office: Call if Dr. Elease Hashimoto can see.   Symptoms  Reason For Call & Symptoms: Lower back pain , waist high and SX started 07/11/12 and was really bad 07/14/12 and got diaphoretic.   Pt called 07/14/13 and note in EPIC.  This AM hurting with standing up and walking. He took Ibuprofen 200 mg x 2 and uses ice and it subsides.  He has strained his back before.  He has not carried dog downstairs in the past 2 days  Reviewed Health History In EMR: Yes  Reviewed Medications In EMR: Yes  Reviewed Allergies In EMR: Yes  Reviewed Surgeries / Procedures: Yes  Date of Onset of Symptoms: 07/14/2013  Treatments Tried: Motrin  Treatments Tried Worked: No  Guideline(s) Used:  Back Pain  Disposition Per Guideline:   Go to Office Now.  Offered pt an appt at [facility] but he wants to see Dr. Elease Hashimoto if he can be worked in.   Reason For Disposition Reached:   Severe back pain  Advice Given:  No lifting or straining.  Continue Ibuprofen   Patient Will Follow Care Advice:  YES

## 2013-07-17 ENCOUNTER — Ambulatory Visit: Payer: BC Managed Care – PPO | Admitting: Family Medicine

## 2013-07-27 ENCOUNTER — Other Ambulatory Visit: Payer: Self-pay | Admitting: Family Medicine

## 2013-08-17 ENCOUNTER — Other Ambulatory Visit: Payer: Self-pay | Admitting: Family Medicine

## 2013-12-02 ENCOUNTER — Other Ambulatory Visit: Payer: Self-pay | Admitting: Family Medicine

## 2013-12-27 ENCOUNTER — Other Ambulatory Visit: Payer: Self-pay | Admitting: Family Medicine

## 2014-01-23 ENCOUNTER — Other Ambulatory Visit: Payer: Self-pay | Admitting: Family Medicine

## 2014-01-24 ENCOUNTER — Other Ambulatory Visit: Payer: Self-pay | Admitting: Family Medicine

## 2014-02-24 ENCOUNTER — Other Ambulatory Visit: Payer: Self-pay | Admitting: Family Medicine

## 2014-03-22 ENCOUNTER — Other Ambulatory Visit: Payer: Self-pay | Admitting: Family Medicine

## 2014-04-01 ENCOUNTER — Encounter: Payer: Self-pay | Admitting: Family Medicine

## 2014-04-01 ENCOUNTER — Ambulatory Visit (INDEPENDENT_AMBULATORY_CARE_PROVIDER_SITE_OTHER): Payer: BC Managed Care – PPO | Admitting: Family Medicine

## 2014-04-01 VITALS — BP 120/70 | HR 58 | Wt 247.0 lb

## 2014-04-01 DIAGNOSIS — Z23 Encounter for immunization: Secondary | ICD-10-CM

## 2014-04-01 DIAGNOSIS — I10 Essential (primary) hypertension: Secondary | ICD-10-CM

## 2014-04-01 DIAGNOSIS — Z125 Encounter for screening for malignant neoplasm of prostate: Secondary | ICD-10-CM

## 2014-04-01 LAB — PSA: PSA: 1.74 ng/mL (ref 0.10–4.00)

## 2014-04-01 LAB — BASIC METABOLIC PANEL
BUN: 17 mg/dL (ref 6–23)
CO2: 29 meq/L (ref 19–32)
Calcium: 9.4 mg/dL (ref 8.4–10.5)
Chloride: 100 mEq/L (ref 96–112)
Creatinine, Ser: 1.2 mg/dL (ref 0.4–1.5)
GFR: 64.75 mL/min (ref >60.00)
Glucose, Bld: 110 mg/dL — ABNORMAL HIGH (ref 70–99)
POTASSIUM: 4.3 meq/L (ref 3.5–5.1)
Sodium: 135 mEq/L (ref 135–145)

## 2014-04-01 MED ORDER — METOPROLOL TARTRATE 50 MG PO TABS: ORAL_TABLET | ORAL | Status: DC

## 2014-04-01 MED ORDER — HYDROCHLOROTHIAZIDE 25 MG PO TABS: 25.0000 mg | ORAL_TABLET | Freq: Every day | ORAL | Status: DC

## 2014-04-01 MED ORDER — LISINOPRIL 20 MG PO TABS: ORAL_TABLET | ORAL | Status: DC

## 2014-04-01 NOTE — Progress Notes (Signed)
Pre visit review using our clinic review tool, if applicable. No additional management support is needed unless otherwise documented below in the visit note. 

## 2014-04-01 NOTE — Addendum Note (Signed)
Addended by: Colleen Can on: 04/01/2014 09:13 AM   Modules accepted: Orders

## 2014-04-01 NOTE — Progress Notes (Signed)
   Subjective:    Patient ID: Samuel [name], male    DOB: September 13, 1950, 63 y.o.   MRN: [id number]  HPI  Patient seen for medical followup. Hypertension treated with 3 drug regimen of lisinopril, HCTZ, and metoprolol. Blood pressure well controlled. No headaches. No dizziness. No chest pains. He had history of minimally elevated blood sugar just over 100. No symptoms of polyuria or polydipsia. Compliant with medications and no side effects. He has not had flu vaccine yet.  Past Medical History  Diagnosis Date  . ESSENTIAL HYPERTENSION 12/21/2009  . VENTRICULAR HYPERTROPHY, LEFT 01/04/2010  . EPISTAXIS, RECURRENT 12/21/2009  . ABNORMAL ELECTROCARDIOGRAM 12/21/2009   Past Surgical History  Procedure Laterality Date  . Eye surgery  2009    catarac    reports that he quit smoking about 25 years ago. He does not have any smokeless tobacco history on file. He reports that he drinks alcohol. His drug history is not on file. family history includes Cancer in his mother and paternal grandmother; Heart disease in his father, maternal grandfather, and paternal grandfather; Hypertension in his father, maternal grandfather, mother, and paternal grandfather. Allergies  Allergen Reactions  . Penicillins     REACTION: hives     Review of Systems  Constitutional: Negative for fatigue.  Eyes: Negative for visual disturbance.  Respiratory: Negative for cough, chest tightness and shortness of breath.   Cardiovascular: Negative for chest pain, palpitations and leg swelling.  Endocrine: Negative for polydipsia and polyuria.  Neurological: Negative for dizziness, syncope, weakness, light-headedness and headaches.       Objective:   Physical Exam  Constitutional: He is oriented to person, place, and time. He appears well-developed and well-nourished.  HENT:  Right Ear: External ear normal.  Left Ear: External ear normal.  Mouth/Throat: Oropharynx is clear and moist.  Eyes: Pupils are equal, round, and  reactive to light.  Neck: Neck supple. No thyromegaly present.  Cardiovascular: Normal rate and regular rhythm.   Pulmonary/Chest: Effort normal and breath sounds normal. No respiratory distress. He has no wheezes. He has no rales.  Musculoskeletal: He exhibits no edema.  Neurological: He is alert and oriented to person, place, and time.          Assessment & Plan:  Hypertension. Well controlled. He does have history of mild LVH several years ago but has had very well-controlled blood pressure now for several years. Check basic metabolic panel. Flu vaccine given. Consider complete physical this year

## 2014-09-12 ENCOUNTER — Other Ambulatory Visit: Payer: Self-pay | Admitting: Family Medicine

## 2015-04-03 ENCOUNTER — Other Ambulatory Visit: Payer: Self-pay | Admitting: Family Medicine

## 2015-04-04 ENCOUNTER — Other Ambulatory Visit: Payer: Self-pay | Admitting: Family Medicine

## 2015-04-16 ENCOUNTER — Other Ambulatory Visit: Payer: Self-pay | Admitting: Family Medicine

## 2015-04-28 ENCOUNTER — Other Ambulatory Visit: Payer: Self-pay | Admitting: Family Medicine

## 2015-05-16 ENCOUNTER — Other Ambulatory Visit: Payer: Self-pay | Admitting: Family Medicine

## 2015-05-25 ENCOUNTER — Other Ambulatory Visit: Payer: Self-pay | Admitting: Family Medicine

## 2015-09-24 ENCOUNTER — Other Ambulatory Visit: Payer: Self-pay | Admitting: Family Medicine

## 2015-10-04 ENCOUNTER — Encounter: Payer: Self-pay | Admitting: Family Medicine

## 2015-10-04 ENCOUNTER — Ambulatory Visit (INDEPENDENT_AMBULATORY_CARE_PROVIDER_SITE_OTHER): Payer: Managed Care, Other (non HMO) | Admitting: Family Medicine

## 2015-10-04 VITALS — BP 122/60 | HR 76 | Temp 100.0°F | Ht 71.0 in | Wt 247.9 lb

## 2015-10-04 DIAGNOSIS — R509 Fever, unspecified: Secondary | ICD-10-CM

## 2015-10-04 DIAGNOSIS — J111 Influenza due to unidentified influenza virus with other respiratory manifestations: Secondary | ICD-10-CM | POA: Diagnosis not present

## 2015-10-04 LAB — POCT INFLUENZA A/B: Influenza A, POC: POSITIVE — AB

## 2015-10-04 MED ORDER — OSELTAMIVIR PHOSPHATE 75 MG PO CAPS: 75.0000 mg | ORAL_CAPSULE | Freq: Two times a day (BID) | ORAL | Status: DC

## 2015-10-04 NOTE — Progress Notes (Signed)
Pre visit review using our clinic review tool, if applicable. No additional management support is needed unless otherwise documented below in the visit note. 

## 2015-10-04 NOTE — Patient Instructions (Signed)

## 2015-10-04 NOTE — Progress Notes (Signed)
   Subjective:    Patient ID: Samuel [name], male    DOB: 07-30-1950, 65 y.o.   MRN: [id number]  HPI  Patient seen for acute visit with onset late Saturday of cough, body aches, headaches, increased malaise. Minimal nasal congestion. No nausea or vomiting. Minimal sore throat. Cough has been dry. Patient had flu vaccine earlier this year. Keeping down fluids well.  Past Medical History  Diagnosis Date  . ESSENTIAL HYPERTENSION 12/21/2009  . VENTRICULAR HYPERTROPHY, LEFT 01/04/2010  . EPISTAXIS, RECURRENT 12/21/2009  . ABNORMAL ELECTROCARDIOGRAM 12/21/2009   Past Surgical History  Procedure Laterality Date  . Eye surgery  2009    catarac    reports that he quit smoking about 27 years ago. He does not have any smokeless tobacco history on file. He reports that he drinks alcohol. His drug history is not on file. family history includes Cancer in his mother and paternal grandmother; Heart disease in his father, maternal grandfather, and paternal grandfather; Hypertension in his father, maternal grandfather, mother, and paternal grandfather. Allergies  Allergen Reactions  . Penicillins     REACTION: hives      Review of Systems  Constitutional: Positive for fever, chills and fatigue.  HENT: Negative for sore throat.   Respiratory: Positive for cough.   Neurological: Positive for headaches.       Objective:   Physical Exam  Constitutional: He appears well-developed and well-nourished.  HENT:  Right Ear: External ear normal.  Left Ear: External ear normal.  Mouth/Throat: Oropharynx is clear and moist.  Neck: Neck supple.  Cardiovascular: Normal rate and regular rhythm.   Pulmonary/Chest: Effort normal and breath sounds normal. No respiratory distress. He has no wheezes. He has no rales.  Lymphadenopathy:    He has no cervical adenopathy.          Assessment & Plan:   Influenza. Screening test positive. Start Tamiflu 75 mg twice a day for 5 days. Continue ibuprofen as  necessary. Push fluids. Follow-up immediately for any worsening symptoms

## 2015-10-05 ENCOUNTER — Telehealth: Payer: Self-pay | Admitting: Family Medicine

## 2015-10-05 NOTE — Telephone Encounter (Signed)
Patient Name: Samuel [name]  DOB: [birth year]    Initial Comment Caller states diagnosed with flu yesterday and given tamiflu. Fever back up to 101-102. Still coughing and some phlegm.    Nurse Assessment  Nurse: Leilani Merl, RN, Heather Date/Time (Eastern Time): 10/05/2015 2:31:35 PM  Confirm and document reason for call. If symptomatic, describe symptoms. You must click the next button to save text entered. ---Caller states diagnosed with flu yesterday and given tamiflu. Fever back up to 101-102. Still coughing and some phlegm. He started with the symptoms on Saturday  Has the patient traveled out of the country within the last 30 days? ---Not Applicable  Does the patient have any new or worsening symptoms? ---Yes  Will a triage be completed? ---Yes  Related visit to physician within the last 2 weeks? ---Yes  Does the PT have any chronic conditions? (i.e. diabetes, asthma, etc.) ---Yes  List chronic conditions. ---HTN  Is this a behavioral health or substance abuse call? ---No     Guidelines    Guideline Title Affirmed Question Affirmed Notes  Influenza Follow-up Call [1] Influenza (diagnosed by HCP) AND [id number] no complications (all triage questions negative)    Final Disposition User   Crystal [place], RN, Water quality scientist    Disagree/Comply: Comply

## 2015-10-05 NOTE — Telephone Encounter (Signed)
Home care advice given - FYI

## 2015-10-06 ENCOUNTER — Telehealth: Payer: Self-pay | Admitting: Family Medicine

## 2015-10-06 NOTE — Telephone Encounter (Signed)
Can we do this without him being a patient here?

## 2015-10-06 NOTE — Telephone Encounter (Signed)
Spoke with patient.  He feels somewhat better.  Still some cough but no fever.

## 2015-10-06 NOTE — Telephone Encounter (Signed)
NO.  i have already talked to pt.  We cannot prescribe to anyone we have no medical relationship with. Father understands/

## 2015-10-06 NOTE — Telephone Encounter (Signed)
Pt states Dr Elease Hashimoto prescribed him tami flu yesterday. Now his son has the flu. Pt's son goes to [place] and they are not in today.  Pt would like to know if Dr Elease Hashimoto will prescribe his   son tami flu as well.       [facility] Nong  09/20/90  [facility]

## 2015-11-10 ENCOUNTER — Other Ambulatory Visit: Payer: Self-pay | Admitting: Family Medicine

## 2015-11-29 LAB — HEPATIC FUNCTION PANEL
ALT: 77 U/L — AB (ref 10–40)
AST: 55 U/L — AB (ref 14–40)
Alkaline Phosphatase: 50 U/L (ref 25–125)

## 2015-11-29 LAB — CBC AND DIFFERENTIAL
HCT: 44 % (ref 41–53)
HEMOGLOBIN: 15.2 g/dL (ref 13.5–17.5)
Neutrophils Absolute: 3534 /uL
PLATELETS: 299 10*3/uL (ref 150–399)
WBC: 6.2 10*3/mL

## 2015-11-29 LAB — BASIC METABOLIC PANEL
BUN: 20 mg/dL (ref 4–21)
Creatinine: 1.2 mg/dL (ref 0.6–1.3)
GLUCOSE: 115 mg/dL
Potassium: 4.4 mmol/L (ref 3.4–5.3)
Sodium: 142 mmol/L (ref 137–147)

## 2015-11-29 LAB — POCT ERYTHROCYTE SEDIMENTATION RATE, NON-AUTOMATED: Sed Rate: 4 mm

## 2015-12-07 ENCOUNTER — Other Ambulatory Visit: Payer: Self-pay | Admitting: Family Medicine

## 2016-09-05 ENCOUNTER — Other Ambulatory Visit: Payer: Self-pay | Admitting: Family Medicine

## 2016-09-05 NOTE — Telephone Encounter (Signed)
Patient needs an office visit for more refills

## 2016-10-31 ENCOUNTER — Other Ambulatory Visit: Payer: Self-pay | Admitting: Family Medicine

## 2016-11-24 ENCOUNTER — Other Ambulatory Visit: Payer: Self-pay | Admitting: Family Medicine

## 2016-12-24 ENCOUNTER — Other Ambulatory Visit: Payer: Self-pay | Admitting: Family Medicine

## 2017-01-09 ENCOUNTER — Ambulatory Visit (INDEPENDENT_AMBULATORY_CARE_PROVIDER_SITE_OTHER): Payer: Managed Care, Other (non HMO) | Admitting: Family Medicine

## 2017-01-09 ENCOUNTER — Encounter: Payer: Self-pay | Admitting: Family Medicine

## 2017-01-09 VITALS — BP 130/80 | HR 51 | Temp 97.5°F | Ht 70.5 in | Wt 261.6 lb

## 2017-01-09 DIAGNOSIS — Z0000 Encounter for general adult medical examination without abnormal findings: Secondary | ICD-10-CM | POA: Diagnosis not present

## 2017-01-09 DIAGNOSIS — Z23 Encounter for immunization: Secondary | ICD-10-CM | POA: Diagnosis not present

## 2017-01-09 LAB — LIPID PANEL
Cholesterol: 162 mg/dL (ref 0–200)
HDL: 42.7 mg/dL (ref >39.00)
LDL CALC: 97 mg/dL (ref 0–99)
NONHDL: 119.52
Total CHOL/HDL Ratio: 4
Triglycerides: 111 mg/dL (ref 0.0–149.0)
VLDL: 22.2 mg/dL (ref 0.0–40.0)

## 2017-01-09 LAB — CBC WITH DIFFERENTIAL/PLATELET
Basophils Absolute: 0.1 10*3/uL (ref 0.0–0.1)
Basophils Relative: 1.1 % (ref 0.0–3.0)
EOS ABS: 0.1 10*3/uL (ref 0.0–0.7)
Eosinophils Relative: 1.2 % (ref 0.0–5.0)
HCT: 45.2 % (ref 39.0–52.0)
HEMOGLOBIN: 15.7 g/dL (ref 13.0–17.0)
LYMPHS PCT: 22.2 % (ref 12.0–46.0)
Lymphs Abs: 1.8 10*3/uL (ref 0.7–4.0)
MCHC: 34.7 g/dL (ref 30.0–36.0)
MCV: 92.8 fl (ref 78.0–100.0)
MONO ABS: 0.8 10*3/uL (ref 0.1–1.0)
Monocytes Relative: 9.6 % (ref 3.0–12.0)
Neutro Abs: 5.5 10*3/uL (ref 1.4–7.7)
Neutrophils Relative %: 65.9 % (ref 43.0–77.0)
Platelets: 338 10*3/uL (ref 150.0–400.0)
RBC: 4.87 Mil/uL (ref 4.22–5.81)
RDW: 13.1 % (ref 11.5–15.5)
WBC: 8.3 10*3/uL (ref 4.0–10.5)

## 2017-01-09 LAB — HEPATIC FUNCTION PANEL
ALBUMIN: 4.2 g/dL (ref 3.5–5.2)
ALT: 31 U/L (ref 0–53)
AST: 25 U/L (ref 0–37)
Alkaline Phosphatase: 47 U/L (ref 39–117)
Bilirubin, Direct: 0.2 mg/dL (ref 0.0–0.3)
TOTAL PROTEIN: 6.9 g/dL (ref 6.0–8.3)
Total Bilirubin: 0.8 mg/dL (ref 0.2–1.2)

## 2017-01-09 LAB — BASIC METABOLIC PANEL
BUN: 23 mg/dL (ref 6–23)
CO2: 27 mEq/L (ref 19–32)
CREATININE: 1.25 mg/dL (ref 0.40–1.50)
Calcium: 9.4 mg/dL (ref 8.4–10.5)
Chloride: 102 mEq/L (ref 96–112)
GFR: 61.81 mL/min (ref >60.00)
GLUCOSE: 122 mg/dL — AB (ref 70–99)
POTASSIUM: 4.1 meq/L (ref 3.5–5.1)
Sodium: 138 mEq/L (ref 135–145)

## 2017-01-09 LAB — TSH: TSH: 1.89 u[IU]/mL (ref 0.35–4.50)

## 2017-01-09 LAB — PSA: PSA: 1.99 ng/mL (ref 0.10–4.00)

## 2017-01-09 LAB — HEPATITIS C ANTIBODY: HCV AB: NEGATIVE

## 2017-01-09 MED ORDER — METOPROLOL TARTRATE 50 MG PO TABS: 50.0000 mg | ORAL_TABLET | Freq: Every day | ORAL | 3 refills | Status: DC

## 2017-01-09 MED ORDER — LISINOPRIL 20 MG PO TABS
20.0000 mg | ORAL_TABLET | Freq: Every day | ORAL | 3 refills | Status: DC
Start: 2017-01-09 — End: 2017-12-23

## 2017-01-09 MED ORDER — HYDROCHLOROTHIAZIDE 25 MG PO TABS: 25.0000 mg | ORAL_TABLET | Freq: Every day | ORAL | 3 refills | Status: DC

## 2017-01-09 NOTE — Addendum Note (Signed)
Addended by: Westley Hummer B on: 01/09/2017 08:42 AM   Modules accepted: Orders

## 2017-01-09 NOTE — Patient Instructions (Signed)
Check on coverage for Cologuard and let us know if interested. Remember to get Shingrix booster in 2 to 6 months.

## 2017-01-09 NOTE — Progress Notes (Signed)
Subjective:     Patient ID: Samuel [name], male   DOB: 06/06/1951, 66 y.o.   MRN: [id number]  HPI  Patient here for physical exam. He has history of hypertension and has been controlled fairly well for several years now on 3 drug regimen. No history of screening colonoscopy. No prior history of shingles vaccine. We are not aware of any prior hepatitis C screening. He is low risk.  No specific complaints today other than had recent foot pain. Not sure if this was injury related. Some question of gout. He does not recall any erythema or warmth. No family history of gout.  Nonsmoker. Patient declines colonoscopy screening. He has not ruled out other methods of screening such as Hemoccult cards or DNA base stool testing  Past Medical History:  Diagnosis Date  . ABNORMAL ELECTROCARDIOGRAM 12/21/2009  . EPISTAXIS, RECURRENT 12/21/2009  . ESSENTIAL HYPERTENSION 12/21/2009  . VENTRICULAR HYPERTROPHY, LEFT 01/04/2010   Past Surgical History:  Procedure Laterality Date  . EYE SURGERY  2009   catarac    reports that he quit smoking about 28 years ago. He has a 18.00 pack-year smoking history. He has never used smokeless tobacco. He reports that he drinks alcohol. His drug history is not on file. family history includes Cancer in his mother and paternal grandmother; Heart disease in his father, maternal grandfather, and paternal grandfather; Hypertension in his father, maternal grandfather, mother, and paternal grandfather. Allergies  Allergen Reactions  . Penicillins     REACTION: hives    Review of Systems  Constitutional: Negative for activity change, appetite change, fatigue and fever.  HENT: Negative for congestion, ear pain and trouble swallowing.   Eyes: Negative for pain and visual disturbance.  Respiratory: Negative for cough, shortness of breath and wheezing.   Cardiovascular: Negative for chest pain and palpitations.  Gastrointestinal: Negative for abdominal distention, abdominal pain,  blood in stool, constipation, diarrhea, nausea, rectal pain and vomiting.  Genitourinary: Negative for dysuria, hematuria and testicular pain.  Musculoskeletal: Negative for arthralgias and joint swelling.  Skin: Negative for rash.  Neurological: Negative for dizziness, syncope and headaches.  Hematological: Negative for adenopathy.  Psychiatric/Behavioral: Negative for confusion and dysphoric mood.       Objective:   Physical Exam  Constitutional: He is oriented to person, place, and time. He appears well-developed and well-nourished. No distress.  HENT:  Head: Normocephalic and atraumatic.  Right Ear: External ear normal.  Left Ear: External ear normal.  Mouth/Throat: Oropharynx is clear and moist.  Eyes: Conjunctivae and EOM are normal. Pupils are equal, round, and reactive to light.  Neck: Normal range of motion. Neck supple. No thyromegaly present.  Cardiovascular: Normal rate, regular rhythm and normal heart sounds.   No murmur heard. Pulmonary/Chest: No respiratory distress. He has no wheezes. He has no rales.  Abdominal: Soft. Bowel sounds are normal. He exhibits no distension and no mass. There is no tenderness. There is no rebound and no guarding.  Genitourinary: Rectum normal and prostate normal.  Musculoskeletal: He exhibits no edema.  Lymphadenopathy:    He has no cervical adenopathy.  Neurological: He is alert and oriented to person, place, and time. He displays normal reflexes. No cranial nerve deficit.  Skin: No rash noted.  Psychiatric: He has a normal mood and affect.       Assessment:     Physical exam. Patient has not had previous colonoscopy or colon cancer screening in any manner. He declines colonoscopy but is willing to consider Cologuard.  Also, no history of hepatitis C screening or shingles vaccine    Plan:     -Obtain screening lab work-including hepatitis C antibody -The natural history of prostate cancer and ongoing controversy regarding  screening and potential treatment outcomes of prostate cancer has been discussed with the patient. The meaning of a false positive PSA and a false negative PSA has been discussed. He indicates understanding of the limitations of this screening test and wishes to proceed with screening PSA testing. -Patient requesting new shingles vaccine. He is reminded to get booster in 2-6 months -He was given information on Cologuard-and will check on insurance coverage first -Medications refilled for 1 year  Eulas Post MD [facility]

## 2017-07-11 ENCOUNTER — Ambulatory Visit: Payer: Self-pay | Admitting: Family Medicine

## 2017-07-17 ENCOUNTER — Encounter: Payer: Self-pay | Admitting: Family Medicine

## 2017-07-17 ENCOUNTER — Ambulatory Visit: Payer: 59 | Admitting: Family Medicine

## 2017-07-17 VITALS — BP 108/72 | HR 50 | Temp 98.3°F

## 2017-07-17 DIAGNOSIS — R7303 Prediabetes: Secondary | ICD-10-CM | POA: Diagnosis not present

## 2017-07-17 DIAGNOSIS — R7309 Other abnormal glucose: Secondary | ICD-10-CM

## 2017-07-17 DIAGNOSIS — Z23 Encounter for immunization: Secondary | ICD-10-CM | POA: Diagnosis not present

## 2017-07-17 LAB — POCT GLYCOSYLATED HEMOGLOBIN (HGB A1C): Hemoglobin A1C: 5.8

## 2017-07-17 NOTE — Progress Notes (Signed)
Subjective:     Patient ID: Samuel [name], male   DOB: 08/14/1950, 67 y.o.   MRN: [id number]  HPI Patient here to follow-up elevated blood sugar.  He has history of obesity and hypertension. medicatons reviewed. He remains on metoprolol, lisinopril, HCTZ. No headaches. No dizziness. No chest pains. No polyuria or polydipsia. Not monitoring blood sugars at home. Inconsistent with exercise. Compliant with medications.  We had recommended based on most recent fasting glucose 122 coming back for A1c and is here today for that.  No polyuria or polydipsia.  Past Medical History:  Diagnosis Date  . ABNORMAL ELECTROCARDIOGRAM 12/21/2009  . EPISTAXIS, RECURRENT 12/21/2009  . ESSENTIAL HYPERTENSION 12/21/2009  . VENTRICULAR HYPERTROPHY, LEFT 01/04/2010   Past Surgical History:  Procedure Laterality Date  . EYE SURGERY  2009   catarac    reports that he quit smoking about 28 years ago. He has a 18.00 pack-year smoking history. he has never used smokeless tobacco. He reports that he drinks alcohol. His drug history is not on file. family history includes Cancer in his mother and paternal grandmother; Heart disease in his father, maternal grandfather, and paternal grandfather; Hypertension in his father, maternal grandfather, mother, and paternal grandfather. Allergies  Allergen Reactions  . Penicillins     REACTION: hives     Review of Systems  Constitutional: Negative for fatigue.  Eyes: Negative for visual disturbance.  Respiratory: Negative for cough, chest tightness and shortness of breath.   Cardiovascular: Negative for chest pain, palpitations and leg swelling.  Endocrine: Negative for polydipsia and polyuria.  Neurological: Negative for dizziness, syncope, weakness, light-headedness and headaches.       Objective:   Physical Exam  Constitutional: He is oriented to person, place, and time. He appears well-developed and well-nourished.  HENT:  Right Ear: External ear normal.  Left Ear:  External ear normal.  Mouth/Throat: Oropharynx is clear and moist.  Eyes: Pupils are equal, round, and reactive to light.  Neck: Neck supple. No thyromegaly present.  Cardiovascular: Normal rate and regular rhythm.  Pulmonary/Chest: Effort normal and breath sounds normal. No respiratory distress. He has no wheezes. He has no rales.  Musculoskeletal: He exhibits no edema.  Neurological: He is alert and oriented to person, place, and time.       Assessment:     #1 prediabetes. A1c today 5.8%  #2 hypertension stable and at goal    Plan:     -we discussed prevention of type 2 diabetes through diet and exercise. Have recommended low glycemic diet and more consistent exercise with recommended combination of resistance and aerobic -Plan repeat A1c in 6 months at his physical -Patient also needs second shingles vaccine today  Eulas Post MD [facility]

## 2017-07-17 NOTE — Patient Instructions (Signed)
Hyperglycemia Hyperglycemia occurs when the level of sugar (glucose) in the blood is too high. Glucose is a type of sugar that provides the body's main source of energy. Certain hormones (insulin and glucagon) control the level of glucose in the blood. Insulin lowers blood glucose, and glucagon increases blood glucose. Hyperglycemia can result from having too little insulin in the bloodstream, or from the body not responding normally to insulin. Hyperglycemia occurs most often in people who have diabetes (diabetes mellitus), but it can happen in people who do not have diabetes. It can develop quickly, and it can be life-threatening if it causes you to become severely dehydrated (diabetic ketoacidosis or hyperglycemic hyperosmolar state). Severe hyperglycemia is a medical emergency. What are the causes? If you have diabetes, hyperglycemia may be caused by:  Diabetes medicine.  Medicines that increase blood glucose or affect your diabetes control.  Not eating enough, or not eating often enough.  Changes in physical activity level.  Being sick or having an infection.  If you have prediabetes or undiagnosed diabetes:  Hyperglycemia may be caused by those conditions.  If you do not have diabetes, hyperglycemia may be caused by:  Certain medicines, including steroid medicines, beta-blockers, epinephrine, and thiazide diuretics.  Stress.  Serious illness.  Surgery.  Diseases of the pancreas.  Infection.  What increases the risk? Hyperglycemia is more likely to develop in people who have risk factors for diabetes, such as:  Having a family member with diabetes.  Having a gene for type 1 diabetes that is passed from parent to child (inherited).  Living in an area with cold weather conditions.  Exposure to certain viruses.  Certain conditions in which the body's disease-fighting (immune) system attacks itself (autoimmune disorders).  Being overweight or obese.  Having an  inactive (sedentary) lifestyle.  Having been diagnosed with insulin resistance.  Having a history of prediabetes, gestational diabetes, or polycystic ovarian syndrome (PCOS).  Being of American-Indian, African-American, Hispanic/Latino, or Asian/Pacific Islander descent.  What are the signs or symptoms? Hyperglycemia may not cause any symptoms. If you do have symptoms, they may include early warning signs, such as:  Increased thirst.  Hunger.  Feeling very tired.  Needing to urinate more often than usual.  Blurry vision.  Other symptoms may develop if hyperglycemia gets worse, such as:  Dry mouth.  Loss of appetite.  Fruity-smelling breath.  Weakness.  Unexpected or rapid weight gain or weight loss.  Tingling or numbness in the hands or feet.  Headache.  Skin that does not quickly return to normal after being lightly pinched and released (poor skin turgor).  Abdominal pain.  Cuts or bruises that are slow to heal.  How is this diagnosed? Hyperglycemia is diagnosed with a blood test to measure your blood glucose level. This blood test is usually done while you are having symptoms. Your health care provider may also do a physical exam and review your medical history. You may have more tests to determine the cause of your hyperglycemia, such as:  A fasting blood glucose (FBG) test. You will not be allowed to eat (you will fast) for at least 8 hours before a blood sample is taken.  An A1c (hemoglobin A1c) blood test. This provides information about blood glucose control over the previous 2-3 months.  An oral glucose tolerance test (OGTT). This measures your blood glucose at two times: ? After fasting. This is your baseline blood glucose level. ? Two hours after drinking a beverage that contains glucose.  How is   this treated? Treatment depends on the cause of your hyperglycemia. Treatment may include:  Taking medicine to regulate your blood glucose levels. If you  take insulin or other diabetes medicines, your medicine or dosage may be adjusted.  Lifestyle changes, such as exercising more, eating healthier foods, or losing weight.  Treating an illness or infection, if this caused your hyperglycemia.  Checking your blood glucose more often.  Stopping or reducing steroid medicines, if these caused your hyperglycemia.  If your hyperglycemia becomes severe and it results in hyperglycemic hyperosmolar state, you must be hospitalized and given IV fluids. Follow these instructions at home: General instructions  Take over-the-counter and prescription medicines only as told by your health care provider.  Do not use any products that contain nicotine or tobacco, such as cigarettes and e-cigarettes. If you need help quitting, ask your health care provider.  Limit alcohol intake to no more than 1 drink per day for nonpregnant women and 2 drinks per day for men. One drink equals 12 oz of beer, 5 oz of wine, or 1 oz of hard liquor.  Learn to manage stress. If you need help with this, ask your health care provider.  Keep all follow-up visits as told by your health care provider. This is important. Eating and drinking  Maintain a healthy weight.  Exercise regularly, as directed by your health care provider.  Stay hydrated, especially when you exercise, get sick, or spend time in hot temperatures.  Eat healthy foods, such as: ? Lean proteins. ? Complex carbohydrates. ? Fresh fruits and vegetables. ? Low-fat dairy products. ? Healthy fats.  Drink enough fluid to keep your urine clear or pale yellow. If you have diabetes:   Make sure you know the symptoms of hyperglycemia.  Follow your diabetes management plan, as told by your health care provider. Make sure you: ? Take your insulin and medicines as directed. ? Follow your exercise plan. ? Follow your meal plan. Eat on time, and do not skip meals. ? Check your blood glucose as often as directed.  Make sure to check your blood glucose before and after exercise. If you exercise longer or in a different way than usual, check your blood glucose more often. ? Follow your sick day plan whenever you cannot eat or drink normally. Make this plan in advance with your health care provider.  Share your diabetes management plan with people in your workplace, school, and household.  Check your urine for ketones when you are ill and as told by your health care provider.  Carry a medical alert card or wear medical alert jewelry. Contact a health care provider if:  Your blood glucose is at or above 240 mg/dL (13.3 mmol/L) for 2 days in a row.  You have problems keeping your blood glucose in your target range.  You have frequent episodes of hyperglycemia. Get help right away if:  You have difficulty breathing.  You have a change in how you think, feel, or act (mental status).  You have nausea or vomiting that does not go away. These symptoms may represent a serious problem that is an emergency. Do not wait to see if the symptoms will go away. Get medical help right away. Call your local emergency services (911 in the [place] Do not drive yourself to the hospital. Summary  Hyperglycemia occurs when the level of sugar (glucose) in the blood is too high.  Hyperglycemia is diagnosed with a blood test to measure your blood glucose level. This blood   test is usually done while you are having symptoms. Your health care provider may also do a physical exam and review your medical history.  If you have diabetes, follow your diabetes management plan as told by your health care provider.  Contact your health care provider if you have problems keeping your blood glucose in your target range. This information is not intended to replace advice given to you by your health care provider. Make sure you discuss any questions you have with your health care provider. Document Released: 12/20/2000 Document Revised:  03/13/2016 Document Reviewed: 03/13/2016 Elsevier Interactive Patient Education  2018 [employer]  

## 2017-12-20 ENCOUNTER — Telehealth: Payer: Self-pay | Admitting: Family Medicine

## 2017-12-20 NOTE — Telephone Encounter (Signed)
Copied from [place] [phone]. Topic: Quick Communication - See Telephone Encounter >> Dec 20, 2017  9:21 AM Boyd Kerbs wrote: CRM for notification. See Telephone encounter for: 12/20/17.  Pt. Is saying he would like to see if Dr. Elease Hashimoto would put in order for labs.  His right foot behind big toe are swollen and hurts   He did not want to set appt unless Dr. Elease Hashimoto requested.  He said it may be gout,  he had the same thing last year.

## 2017-12-21 NOTE — Telephone Encounter (Signed)
Left message on machine for patient to return our call.  CRM created 

## 2017-12-21 NOTE — Telephone Encounter (Signed)
Noted  

## 2017-12-21 NOTE — Telephone Encounter (Signed)
Message given from Dr. Elease Hashimoto. Pt stated that he feels it may not be gout. He stated that he elevated and rested it. Denies redness. Area involved right foot behnd the great toe. Pt states if it does not improve, he will call and make an appointment.

## 2017-12-21 NOTE — Telephone Encounter (Signed)
If requesting uric acid would NOT get at this time.  Ironically, uric acid can actually be falsely low during an acute attack (ie uric acid levels are nondiagnostic during acute gout). Recommend follow-up to further assess. We can address further diagnostics at that point and also treatment options

## 2017-12-23 ENCOUNTER — Other Ambulatory Visit: Payer: Self-pay | Admitting: Family Medicine

## 2018-02-08 ENCOUNTER — Other Ambulatory Visit: Payer: Self-pay | Admitting: Family Medicine

## 2018-02-25 ENCOUNTER — Encounter: Payer: Self-pay | Admitting: Podiatry

## 2018-02-25 ENCOUNTER — Ambulatory Visit (INDEPENDENT_AMBULATORY_CARE_PROVIDER_SITE_OTHER): Payer: Medicare Other | Admitting: Podiatry

## 2018-02-25 ENCOUNTER — Ambulatory Visit (INDEPENDENT_AMBULATORY_CARE_PROVIDER_SITE_OTHER): Payer: Medicare Other

## 2018-02-25 DIAGNOSIS — S99921A Unspecified injury of right foot, initial encounter: Secondary | ICD-10-CM

## 2018-02-25 DIAGNOSIS — M779 Enthesopathy, unspecified: Secondary | ICD-10-CM | POA: Diagnosis not present

## 2018-02-25 DIAGNOSIS — M1000 Idiopathic gout, unspecified site: Secondary | ICD-10-CM

## 2018-02-25 MED ORDER — TRIAMCINOLONE ACETONIDE 10 MG/ML IJ SUSP
10.0000 mg | Freq: Once | INTRAMUSCULAR | Status: AC
Start: 2018-02-25 — End: 2018-02-25
  Administered 2018-02-25: 10 mg

## 2018-02-25 MED ORDER — METHYLPREDNISOLONE 4 MG PO TBPK: ORAL_TABLET | ORAL | 0 refills | Status: DC

## 2018-02-25 NOTE — Patient Instructions (Signed)

## 2018-02-26 DIAGNOSIS — M1000 Idiopathic gout, unspecified site: Secondary | ICD-10-CM | POA: Diagnosis not present

## 2018-02-27 NOTE — Progress Notes (Signed)
Subjective:   Patient ID: Samuel [name], male   DOB: 67 y.o.   MRN: [id number]   HPI Patient presents with significant inflammation of the second MPJ right foot with fluid buildup within the joint and inability to walk on this with no apparent injury.  States that he does not remember anything that may have specifically occurred.  Patient does not smoke likes to be active   Review of Systems  All other systems reviewed and are negative.       Objective:  Physical Exam  Constitutional: He appears well-developed and well-nourished.  Cardiovascular: Intact distal pulses.  Pulmonary/Chest: Effort normal.  Musculoskeletal: Normal range of motion.  Neurological: He is alert.  Skin: Skin is warm.  Nursing note and vitals reviewed.   Neurovascular status intact muscle strength found to be adequate range of motion within normal limits with patient found to have severe fluid buildup second MPJ right with pain around the joint and no history of injury to this area.  Patient has a relatively normal appearance of the second toe with mild elevation     Assessment:  Appears to be some form of inflammatory condition either local or systemic possibility of flexor plate injury     Plan:  H&P condition reviewed at great length and I did note there is fluid around the joint I did do a proximal block and then aspirated this getting out a small amount of blood indicating either trauma or possibility of a systemic inflammation and I did send this off for evaluation and ruling out inflammatory conditions.  I then injected the joint with quarter cc Dexasone Kenalog advised on rigid bottom shoes and reappoint 1 week to review and I also sent for blood work to rule out any systemic gout or other pathology  X-ray was negative for signs of fracture or arthritic condition

## 2018-02-28 LAB — ANA, IFA COMPREHENSIVE PANEL
Anti Nuclear Antibody(ANA): NEGATIVE
ENA SM Ab Ser-aCnc: <1 AI
SCLERODERMA (SCL-70) (ENA) ANTIBODY, IGG: NEGATIVE AI
SM/RNP: NEGATIVE AI
SSA (RO) (ENA) ANTIBODY, IGG: NEGATIVE AI
SSB (La) (ENA) Antibody, IgG: <1 AI
ds DNA Ab: <1 IU/mL

## 2018-02-28 LAB — RHEUMATOID FACTOR: Rhuematoid fact SerPl-aCnc: <14 IU/mL (ref <14)

## 2018-02-28 LAB — URIC ACID: Uric Acid, Serum: 8 mg/dL (ref 4.0–8.0)

## 2018-02-28 LAB — SEDIMENTATION RATE: SED RATE: 9 mm/h (ref 0–20)

## 2018-02-28 LAB — C-REACTIVE PROTEIN: CRP: 59.2 mg/L — ABNORMAL HIGH (ref <8.0)

## 2018-03-04 ENCOUNTER — Encounter: Payer: Self-pay | Admitting: Podiatry

## 2018-03-04 ENCOUNTER — Ambulatory Visit (INDEPENDENT_AMBULATORY_CARE_PROVIDER_SITE_OTHER): Payer: Medicare Other | Admitting: Podiatry

## 2018-03-04 DIAGNOSIS — M1000 Idiopathic gout, unspecified site: Secondary | ICD-10-CM | POA: Diagnosis not present

## 2018-03-04 DIAGNOSIS — M779 Enthesopathy, unspecified: Secondary | ICD-10-CM | POA: Diagnosis not present

## 2018-03-04 NOTE — Progress Notes (Signed)
Subjective:   Patient ID: Samuel [name], male   DOB: 67 y.o.   MRN: [id number]   HPI Patient presents stating his foot seems to be improved with pain still if he does too much on it   ROS      Objective:  Physical Exam  Neurovascular status intact with inflammation of the second MPJ right which seems to be quite a bit improved with mild discomfort with deep palpation     Assessment:  Overall improved with patient having had a significant inflammatory complex right     Plan:  Spent a great deal of time reviewing this with patient and at this point I have recommended utilizing padding therapy rigid bottom shoes and if symptoms should recur we will treat again and we will probably place him on allopurinol.  I did discuss the elevation of the C-reactive protein and his uric acid being at a high normal level and there is a strong possibility he may be dealing with gout and I have asked that his family physician repeat blood work when he sees him for his next physical.  He will be seen by me as needed

## 2018-03-21 ENCOUNTER — Other Ambulatory Visit: Payer: Self-pay | Admitting: Family Medicine

## 2018-05-06 ENCOUNTER — Other Ambulatory Visit: Payer: Self-pay | Admitting: Family Medicine

## 2018-06-17 ENCOUNTER — Other Ambulatory Visit: Payer: Self-pay | Admitting: Family Medicine

## 2018-06-18 ENCOUNTER — Other Ambulatory Visit: Payer: Self-pay | Admitting: Family Medicine

## 2018-07-03 ENCOUNTER — Other Ambulatory Visit: Payer: Self-pay | Admitting: Family Medicine

## 2018-08-03 ENCOUNTER — Other Ambulatory Visit: Payer: Self-pay | Admitting: Family Medicine

## 2018-08-07 ENCOUNTER — Ambulatory Visit: Payer: 59 | Admitting: Family Medicine

## 2018-08-14 ENCOUNTER — Other Ambulatory Visit: Payer: Self-pay

## 2018-08-14 ENCOUNTER — Ambulatory Visit (INDEPENDENT_AMBULATORY_CARE_PROVIDER_SITE_OTHER): Payer: Medicare Other | Admitting: Family Medicine

## 2018-08-14 ENCOUNTER — Encounter: Payer: Self-pay | Admitting: Family Medicine

## 2018-08-14 VITALS — BP 132/86 | HR 65 | Temp 98.3°F | Ht 70.0 in | Wt 270.2 lb

## 2018-08-14 DIAGNOSIS — Z23 Encounter for immunization: Secondary | ICD-10-CM

## 2018-08-14 DIAGNOSIS — R7303 Prediabetes: Secondary | ICD-10-CM

## 2018-08-14 DIAGNOSIS — N529 Male erectile dysfunction, unspecified: Secondary | ICD-10-CM | POA: Diagnosis not present

## 2018-08-14 DIAGNOSIS — Z125 Encounter for screening for malignant neoplasm of prostate: Secondary | ICD-10-CM | POA: Diagnosis not present

## 2018-08-14 DIAGNOSIS — Z0000 Encounter for general adult medical examination without abnormal findings: Secondary | ICD-10-CM

## 2018-08-14 DIAGNOSIS — I10 Essential (primary) hypertension: Secondary | ICD-10-CM

## 2018-08-14 LAB — BASIC METABOLIC PANEL
BUN: 15 mg/dL (ref 6–23)
CHLORIDE: 103 meq/L (ref 96–112)
CO2: 28 mEq/L (ref 19–32)
Calcium: 9.7 mg/dL (ref 8.4–10.5)
Creatinine, Ser: 1.04 mg/dL (ref 0.40–1.50)
GFR: 71.54 mL/min (ref >60.00)
Glucose, Bld: 108 mg/dL — ABNORMAL HIGH (ref 70–99)
Potassium: 4.4 mEq/L (ref 3.5–5.1)
Sodium: 142 mEq/L (ref 135–145)

## 2018-08-14 LAB — HEMOGLOBIN A1C: Hgb A1c MFr Bld: 6 % (ref 4.6–6.5)

## 2018-08-14 LAB — PSA, MEDICARE: PSA: 1.77 ng/ml (ref 0.10–4.00)

## 2018-08-14 MED ORDER — SILDENAFIL CITRATE 100 MG PO TABS: 50.0000 mg | ORAL_TABLET | Freq: Every day | ORAL | 6 refills | Status: DC | PRN

## 2018-08-14 NOTE — Patient Instructions (Signed)
Try to get back to more regular exercise  Try to lose some weight.

## 2018-08-14 NOTE — Progress Notes (Signed)
Subjective:     Patient ID: Samuel [name], male   DOB: August 18, 1950, 68 y.o.   MRN: [id number]  HPI Patient is seen for Welcome to Medicare visit and for medical follow-up.  He has history of hypertension which is currently stable on 3 drug regimen of lisinopril, metoprolol, and HCTZ.  He has severe elevated blood pressure when first diagnosed several years ago.  At that point he had EKG which suggested some left ventricular hypertrophy.  He has gained some weight and is not currently exercising and knows he needs to lose some weight.  He has past history of prediabetes range blood sugars.  Questionable history of gout.  Last tetanus 2011.  Declines flu vaccines.  Has had previous shingles vaccine.  Needs Prevnar 13.  No history of colonoscopy.  Medications reviewed.  Compliant with medications and denies any side effects.  He does complain of some erectile dysfunction and would like to look at medication options.  No nitroglycerin use.  No history of peripheral vascular disease.  Non-smoker.  Past Medical History:  Diagnosis Date  . ABNORMAL ELECTROCARDIOGRAM 12/21/2009  . EPISTAXIS, RECURRENT 12/21/2009  . ESSENTIAL HYPERTENSION 12/21/2009  . VENTRICULAR HYPERTROPHY, LEFT 01/04/2010   Past Surgical History:  Procedure Laterality Date  . EYE SURGERY  2009   catarac    reports that he has been smoking cigars. He has a 18.00 pack-year smoking history. He has never used smokeless tobacco. He reports current alcohol use. No history on file for drug. family history includes Cancer in his mother and paternal grandmother; Heart disease in his father, maternal grandfather, and paternal grandfather; Hypertension in his father, maternal grandfather, mother, and paternal grandfather. Allergies  Allergen Reactions  . Penicillins     REACTION: hives   1.  Risk factors based on Past Medical , Social, and Family history reviewed and as indicated above with no changes 2.  Limitations in physical activities  None.  No recent falls.  No regular exercise 3.  Depression/mood No active depression or anxiety issues.  PHQ 2 equals 0 4.  Hearing No defiits 5.  ADLs independent in all. 6.  Cognitive function (orientation to time and place, language, writing, speech,memory) no short or long term memory issues.  Language and judgement intact. 7.  Home Safety no issues 8.  Height, weight, and visual acuity.all stable.  Prior history of cataract surgery about 10 years ago. 9.  Counseling discussed need for weight loss.  Restrict overall calories and try to establish more consistent exercise 10. Recommendation of preventive services.  Flu vaccine recommended but declined.  Prevnar 13 given.  Recommend Pneumovax in 1 year.  Patient requesting Cologuard.  He declines colonoscopy.  Cologuard was ordered 11. Labs based on risk factors-basic metabolic panel, hemoglobin A1c, PSA 12. Care Plan recommend he consider other advanced directives such as medical power of attorney 13. Other Providers-none 14. Written schedule of screening/prevention services given to patient. 15.  Advanced directives: Patient has living will.  No medical Power of Attorney.   Review of Systems  Constitutional: Negative for activity change, appetite change, fatigue and fever.  HENT: Positive for congestion. Negative for ear pain and trouble swallowing.        Has frequent nighttime and early morning congestion but usually improves after getting out of bed  Eyes: Negative for pain and visual disturbance.  Respiratory: Negative for cough, shortness of breath and wheezing.   Cardiovascular: Negative for chest pain and palpitations.  Gastrointestinal: Negative for abdominal distention,  abdominal pain, blood in stool, constipation, diarrhea, nausea, rectal pain and vomiting.  Endocrine: Negative for polydipsia and polyuria.  Genitourinary: Negative for dysuria, hematuria and testicular pain.  Musculoskeletal: Positive for arthralgias. Negative  for joint swelling.  Skin: Negative for rash.  Neurological: Negative for dizziness, syncope and headaches.  Hematological: Negative for adenopathy.  Psychiatric/Behavioral: Negative for confusion and dysphoric mood.       Objective:   Physical Exam Constitutional:      General: He is not in acute distress.    Appearance: He is well-developed.  HENT:     Head: Normocephalic and atraumatic.     Right Ear: External ear normal.     Left Ear: External ear normal.     Mouth/Throat:     Pharynx: Oropharynx is clear. No oropharyngeal exudate or posterior oropharyngeal erythema.  Eyes:     Conjunctiva/sclera: Conjunctivae normal.     Pupils: Pupils are equal, round, and reactive to light.  Neck:     Musculoskeletal: Normal range of motion and neck supple.     Thyroid: No thyromegaly.  Cardiovascular:     Rate and Rhythm: Normal rate and regular rhythm.     Heart sounds: Normal heart sounds. No murmur.  Pulmonary:     Effort: No respiratory distress.     Breath sounds: No wheezing or rales.  Abdominal:     General: Bowel sounds are normal. There is no distension.     Palpations: Abdomen is soft. There is no mass.     Tenderness: There is no abdominal tenderness. There is no guarding or rebound.  Lymphadenopathy:     Cervical: No cervical adenopathy.  Skin:    Findings: No rash.     Comments: Has some scattered seborrheic keratoses.  No concerning lesions noted.  Neurological:     Mental Status: He is alert and oriented to person, place, and time.     Cranial Nerves: No cranial nerve deficit.     Deep Tendon Reflexes: Reflexes normal.  Psychiatric:        Mood and Affect: Mood normal.        Thought Content: Thought content normal.        Assessment:     #1 Welcome to Medicare visit.  We discussed several preventative issues as below  #2 hypertension stable  #3 erectile dysfunction.  Never treated with medications previously  #4 Obesity.  #5 history of  prediabetes      Plan:     -Obtain labs including PSA, basic metabolic panel, and hemoglobin A1c  -Prevnar 13 given and recommend Pneumovax in 1 year  -Cologuard ordered.  He declines colonoscopy  -Flu vaccine recommended and patient declines  -Viagra 100 mg 1/2 to 1 tablet daily as needed for erectile dysfunction  -Suggested that he complete advanced directives with medical power of attorney  -Strongly advised weight loss through reduction in calories and increased exercise  -EKG shows sinus rhythm with no acute changes  Eulas Post MD [facility]

## 2018-08-15 ENCOUNTER — Other Ambulatory Visit: Payer: Self-pay | Admitting: Family Medicine

## 2018-08-18 ENCOUNTER — Other Ambulatory Visit: Payer: Self-pay | Admitting: Family Medicine

## 2018-08-19 ENCOUNTER — Telehealth: Payer: Self-pay | Admitting: Family Medicine

## 2018-08-19 ENCOUNTER — Other Ambulatory Visit: Payer: Self-pay | Admitting: Family Medicine

## 2018-08-19 NOTE — Telephone Encounter (Signed)
Copied from [place] [id number]. Topic: Quick Communication - Rx Refill/Question >> Aug 19, 2018  9:58 AM Yvette Rack wrote: Medication: metoprolol tartrate (LOPRESSOR) 50 MG tablet  Has the patient contacted their pharmacy? yes   Preferred Pharmacy (with phone number or street name): [facility] [place], [place] [phone] (Phone) [phone] (Fax)  Agent: Please be advised that RX refills may take up to 3 business days. We ask that you follow-up with your pharmacy.

## 2018-08-19 NOTE — Telephone Encounter (Signed)
Pt. Given lab results and instructions.Verbalizes understanding. 

## 2018-08-23 ENCOUNTER — Other Ambulatory Visit: Payer: Self-pay | Admitting: Family Medicine

## 2018-09-18 DIAGNOSIS — Z1212 Encounter for screening for malignant neoplasm of rectum: Secondary | ICD-10-CM | POA: Diagnosis not present

## 2018-09-18 DIAGNOSIS — Z1211 Encounter for screening for malignant neoplasm of colon: Secondary | ICD-10-CM | POA: Diagnosis not present

## 2018-09-22 LAB — COLOGUARD

## 2018-09-24 ENCOUNTER — Other Ambulatory Visit: Payer: Self-pay | Admitting: Family Medicine

## 2018-09-25 DIAGNOSIS — H33051 Total retinal detachment, right eye: Secondary | ICD-10-CM | POA: Diagnosis not present

## 2018-09-25 DIAGNOSIS — H33001 Unspecified retinal detachment with retinal break, right eye: Secondary | ICD-10-CM | POA: Diagnosis not present

## 2018-09-25 DIAGNOSIS — Z961 Presence of intraocular lens: Secondary | ICD-10-CM | POA: Diagnosis not present

## 2018-09-26 ENCOUNTER — Other Ambulatory Visit: Payer: Self-pay

## 2018-09-26 ENCOUNTER — Encounter (HOSPITAL_COMMUNITY)
Admission: RE | Disposition: A | Discharge status: Home / Self Care | Payer: Self-pay | Source: Ambulatory Visit | Attending: Ophthalmology

## 2018-09-26 ENCOUNTER — Ambulatory Visit (HOSPITAL_COMMUNITY)
Admission: RE | Admit: 2018-09-26 | Discharge: 2018-09-26 | Disposition: A | Discharge status: Home / Self Care | Payer: Medicare Other | Source: Ambulatory Visit | Attending: Ophthalmology | Admitting: Ophthalmology

## 2018-09-26 ENCOUNTER — Ambulatory Visit (HOSPITAL_COMMUNITY): Payer: Medicare Other | Admitting: Certified Registered Nurse Anesthetist

## 2018-09-26 ENCOUNTER — Encounter (INDEPENDENT_AMBULATORY_CARE_PROVIDER_SITE_OTHER): Payer: Self-pay | Admitting: Ophthalmology

## 2018-09-26 ENCOUNTER — Ambulatory Visit (INDEPENDENT_AMBULATORY_CARE_PROVIDER_SITE_OTHER): Payer: Medicare Other | Admitting: Ophthalmology

## 2018-09-26 DIAGNOSIS — F17290 Nicotine dependence, other tobacco product, uncomplicated: Secondary | ICD-10-CM | POA: Diagnosis not present

## 2018-09-26 DIAGNOSIS — H3581 Retinal edema: Secondary | ICD-10-CM | POA: Diagnosis not present

## 2018-09-26 DIAGNOSIS — H35372 Puckering of macula, left eye: Secondary | ICD-10-CM

## 2018-09-26 DIAGNOSIS — H3321 Serous retinal detachment, right eye: Secondary | ICD-10-CM | POA: Diagnosis not present

## 2018-09-26 DIAGNOSIS — H43393 Other vitreous opacities, bilateral: Secondary | ICD-10-CM | POA: Insufficient documentation

## 2018-09-26 DIAGNOSIS — Z961 Presence of intraocular lens: Secondary | ICD-10-CM | POA: Diagnosis not present

## 2018-09-26 DIAGNOSIS — I10 Essential (primary) hypertension: Secondary | ICD-10-CM | POA: Insufficient documentation

## 2018-09-26 DIAGNOSIS — Z8249 Family history of ischemic heart disease and other diseases of the circulatory system: Secondary | ICD-10-CM | POA: Insufficient documentation

## 2018-09-26 DIAGNOSIS — H338 Other retinal detachments: Secondary | ICD-10-CM | POA: Diagnosis not present

## 2018-09-26 DIAGNOSIS — Z880 Allergy status to penicillin: Secondary | ICD-10-CM | POA: Diagnosis not present

## 2018-09-26 DIAGNOSIS — H33001 Unspecified retinal detachment with retinal break, right eye: Secondary | ICD-10-CM | POA: Insufficient documentation

## 2018-09-26 HISTORY — PX: PHOTOCOAGULATION WITH LASER: SHX6027

## 2018-09-26 HISTORY — PX: SCLERAL BUCKLE: SHX5340

## 2018-09-26 HISTORY — PX: GAS/FLUID EXCHANGE: SHX5334

## 2018-09-26 HISTORY — PX: VITRECTOMY 25 GAUGE WITH SCLERAL BUCKLE: SHX6183

## 2018-09-26 LAB — BASIC METABOLIC PANEL
Anion gap: 8 (ref 5–15)
BUN: 16 mg/dL (ref 8–23)
CO2: 24 mmol/L (ref 22–32)
Calcium: 9.7 mg/dL (ref 8.9–10.3)
Chloride: 106 mmol/L (ref 98–111)
Creatinine, Ser: 1.13 mg/dL (ref 0.61–1.24)
GFR calc Af Amer: >60 mL/min (ref >60)
GFR calc non Af Amer: >60 mL/min (ref >60)
Glucose, Bld: 108 mg/dL — ABNORMAL HIGH (ref 70–99)
Potassium: 4.3 mmol/L (ref 3.5–5.1)
SODIUM: 138 mmol/L (ref 135–145)

## 2018-09-26 LAB — CBC
HCT: 50.9 % (ref 39.0–52.0)
Hemoglobin: 16.2 g/dL (ref 13.0–17.0)
MCH: 30.6 pg (ref 26.0–34.0)
MCHC: 31.8 g/dL (ref 30.0–36.0)
MCV: 96 fL (ref 80.0–100.0)
Platelets: 274 10*3/uL (ref 150–400)
RBC: 5.3 MIL/uL (ref 4.22–5.81)
RDW: 12.6 % (ref 11.5–15.5)
WBC: 8.9 10*3/uL (ref 4.0–10.5)
nRBC: 0 % (ref 0.0–0.2)

## 2018-09-26 SURGERY — VITRECTOMY, USING 25-GAUGE INSTRUMENTS, WITH SCLERAL BUCKLING
Anesthesia: General | Site: Eye | Laterality: Right

## 2018-09-26 MED ORDER — ACETAZOLAMIDE SODIUM 500 MG IJ SOLR
INTRAMUSCULAR | Status: AC
  Filled 2018-09-26: qty 500

## 2018-09-26 MED ORDER — DORZOLAMIDE HCL-TIMOLOL MAL 2-0.5 % OP SOLN
OPHTHALMIC | Status: DC | PRN
  Administered 2018-09-26: 1 [drp] via OPHTHALMIC

## 2018-09-26 MED ORDER — EPINEPHRINE PF 1 MG/ML IJ SOLN
INTRAOCULAR | Status: DC | PRN
  Administered 2018-09-26: 500 mL

## 2018-09-26 MED ORDER — EPINEPHRINE PF 1 MG/ML IJ SOLN
INTRAMUSCULAR | Status: AC
  Filled 2018-09-26: qty 1

## 2018-09-26 MED ORDER — GATIFLOXACIN 0.5 % OP SOLN
OPHTHALMIC | Status: AC
  Filled 2018-09-26: qty 2.5

## 2018-09-26 MED ORDER — EPHEDRINE SULFATE 50 MG/ML IJ SOLN
INTRAMUSCULAR | Status: DC | PRN
  Administered 2018-09-26 (×4): 10 mg via INTRAVENOUS
  Administered 2018-09-26: 5 mg via INTRAVENOUS

## 2018-09-26 MED ORDER — INDOCYANINE GREEN 25 MG IV SOLR
INTRAVENOUS | Status: AC
  Filled 2018-09-26: qty 25

## 2018-09-26 MED ORDER — ATROPINE SULFATE 1 % OP SOLN
OPHTHALMIC | Status: DC | PRN
  Administered 2018-09-26: 1 [drp] via OPHTHALMIC

## 2018-09-26 MED ORDER — BSS IO SOLN
INTRAOCULAR | Status: AC
  Filled 2018-09-26: qty 15

## 2018-09-26 MED ORDER — FENTANYL CITRATE (PF) 100 MCG/2ML IJ SOLN: 25.0000 ug | INTRAMUSCULAR | Status: DC | PRN

## 2018-09-26 MED ORDER — SODIUM HYALURONATE 10 MG/ML IO SOLN
INTRAOCULAR | Status: AC
  Filled 2018-09-26: qty 1.7

## 2018-09-26 MED ORDER — MIDAZOLAM HCL 2 MG/2ML IJ SOLN
INTRAMUSCULAR | Status: DC | PRN
  Administered 2018-09-26: 2 mg via INTRAVENOUS

## 2018-09-26 MED ORDER — PHENYLEPHRINE HCL 10 MG/ML IJ SOLN
INTRAMUSCULAR | Status: DC | PRN
  Administered 2018-09-26: 120 ug via INTRAVENOUS
  Administered 2018-09-26: 80 ug via INTRAVENOUS
  Administered 2018-09-26: 120 ug via INTRAVENOUS

## 2018-09-26 MED ORDER — BACITRACIN-POLYMYXIN B 500-10000 UNIT/GM OP OINT
TOPICAL_OINTMENT | OPHTHALMIC | Status: AC
  Filled 2018-09-26: qty 3.5

## 2018-09-26 MED ORDER — SODIUM CHLORIDE (PF) 0.9 % IJ SOLN
INTRAMUSCULAR | Status: AC
  Filled 2018-09-26: qty 10

## 2018-09-26 MED ORDER — SUCCINYLCHOLINE CHLORIDE 200 MG/10ML IV SOSY
PREFILLED_SYRINGE | INTRAVENOUS | Status: AC
  Filled 2018-09-26: qty 10

## 2018-09-26 MED ORDER — TRIAMCINOLONE ACETONIDE 40 MG/ML IJ SUSP
INTRAMUSCULAR | Status: DC | PRN
  Administered 2018-09-26: 40 mg via INTRAMUSCULAR

## 2018-09-26 MED ORDER — PROMETHAZINE HCL 25 MG/ML IJ SOLN: 6.2500 mg | INTRAMUSCULAR | Status: DC | PRN

## 2018-09-26 MED ORDER — BRIMONIDINE TARTRATE 0.2 % OP SOLN
OPHTHALMIC | Status: DC | PRN
  Administered 2018-09-26: 1 [drp] via OPHTHALMIC

## 2018-09-26 MED ORDER — DEXAMETHASONE SODIUM PHOSPHATE 10 MG/ML IJ SOLN
INTRAMUSCULAR | Status: DC | PRN
  Administered 2018-09-26: 5 mg via INTRAVENOUS

## 2018-09-26 MED ORDER — SUGAMMADEX SODIUM 200 MG/2ML IV SOLN
INTRAVENOUS | Status: DC | PRN
  Administered 2018-09-26: 245 mg via INTRAVENOUS

## 2018-09-26 MED ORDER — SODIUM CHLORIDE 0.9 % IV SOLN
INTRAVENOUS | Status: DC | PRN
  Administered 2018-09-26: 25 ug/min via INTRAVENOUS

## 2018-09-26 MED ORDER — POLYMYXIN B SULFATE 500000 UNITS IJ SOLR
INTRAMUSCULAR | Status: AC
  Filled 2018-09-26: qty 500000

## 2018-09-26 MED ORDER — HYDROCODONE-ACETAMINOPHEN 5-325 MG PO TABS: 1.0000 | ORAL_TABLET | ORAL | 0 refills | Status: AC | PRN

## 2018-09-26 MED ORDER — SUCCINYLCHOLINE CHLORIDE 20 MG/ML IJ SOLN
INTRAMUSCULAR | Status: DC | PRN
  Administered 2018-09-26: 120 mg via INTRAVENOUS

## 2018-09-26 MED ORDER — ROCURONIUM BROMIDE 50 MG/5ML IV SOSY
PREFILLED_SYRINGE | INTRAVENOUS | Status: DC | PRN
  Administered 2018-09-26: 50 mg via INTRAVENOUS

## 2018-09-26 MED ORDER — SODIUM CHLORIDE 0.9 % IV SOLN
INTRAVENOUS | Status: DC
  Administered 2018-09-26: 10:00:00 via INTRAVENOUS

## 2018-09-26 MED ORDER — TROPICAMIDE 1 % OP SOLN
1.0000 [drp] | OPHTHALMIC | Status: AC | PRN
  Administered 2018-09-26 (×3): 1 [drp] via OPHTHALMIC
  Filled 2018-09-26 (×2): qty 15

## 2018-09-26 MED ORDER — 0.9 % SODIUM CHLORIDE (POUR BTL) OPTIME: TOPICAL | Status: DC | PRN

## 2018-09-26 MED ORDER — FENTANYL CITRATE (PF) 250 MCG/5ML IJ SOLN
INTRAMUSCULAR | Status: DC | PRN
  Administered 2018-09-26 (×2): 50 ug via INTRAVENOUS
  Administered 2018-09-26: 25 ug via INTRAVENOUS

## 2018-09-26 MED ORDER — BUPIVACAINE HCL (PF) 0.75 % IJ SOLN
INTRAMUSCULAR | Status: AC
  Filled 2018-09-26: qty 10

## 2018-09-26 MED ORDER — PROPOFOL 10 MG/ML IV BOLUS
INTRAVENOUS | Status: DC | PRN
  Administered 2018-09-26: 150 mg via INTRAVENOUS

## 2018-09-26 MED ORDER — CARBACHOL 0.01 % IO SOLN
INTRAOCULAR | Status: AC
  Filled 2018-09-26: qty 1.5

## 2018-09-26 MED ORDER — MIDAZOLAM HCL 2 MG/2ML IJ SOLN
INTRAMUSCULAR | Status: AC
  Filled 2018-09-26: qty 2

## 2018-09-26 MED ORDER — DEXAMETHASONE SODIUM PHOSPHATE 10 MG/ML IJ SOLN
INTRAMUSCULAR | Status: AC
  Filled 2018-09-26: qty 1

## 2018-09-26 MED ORDER — ATROPINE SULFATE 1 % OP SOLN
1.0000 [drp] | OPHTHALMIC | Status: AC | PRN
  Administered 2018-09-26 (×3): 1 [drp] via OPHTHALMIC
  Filled 2018-09-26: qty 2

## 2018-09-26 MED ORDER — LIDOCAINE HCL (CARDIAC) PF 100 MG/5ML IV SOSY
PREFILLED_SYRINGE | INTRAVENOUS | Status: DC | PRN
  Administered 2018-09-26: 100 mg via INTRATRACHEAL

## 2018-09-26 MED ORDER — STERILE WATER FOR INJECTION IJ SOLN
INTRAMUSCULAR | Status: AC
  Filled 2018-09-26: qty 10

## 2018-09-26 MED ORDER — NA CHONDROIT SULF-NA HYALURON 40-30 MG/ML IO SOLN
INTRAOCULAR | Status: DC | PRN
  Administered 2018-09-26: 0.5 mL via INTRAOCULAR

## 2018-09-26 MED ORDER — CEFTAZIDIME 1 G IJ SOLR
INTRAMUSCULAR | Status: AC
  Filled 2018-09-26: qty 1

## 2018-09-26 MED ORDER — PREDNISOLONE ACETATE 1 % OP SUSP
OPHTHALMIC | Status: AC
  Filled 2018-09-26: qty 5

## 2018-09-26 MED ORDER — LIDOCAINE HCL (PF) 1 % IJ SOLN
INTRAMUSCULAR | Status: AC
  Filled 2018-09-26: qty 5

## 2018-09-26 MED ORDER — ATROPINE SULFATE 1 % OP SOLN
OPHTHALMIC | Status: AC
  Filled 2018-09-26: qty 5

## 2018-09-26 MED ORDER — STERILE WATER FOR INJECTION IJ SOLN
INTRAMUSCULAR | Status: DC | PRN
  Administered 2018-09-26: 20 mL

## 2018-09-26 MED ORDER — FENTANYL CITRATE (PF) 250 MCG/5ML IJ SOLN
INTRAMUSCULAR | Status: AC
  Filled 2018-09-26: qty 5

## 2018-09-26 MED ORDER — NA CHONDROIT SULF-NA HYALURON 40-30 MG/ML IO SOLN
INTRAOCULAR | Status: AC
  Filled 2018-09-26: qty 1

## 2018-09-26 MED ORDER — LIDOCAINE HCL (PF) 2 % IJ SOLN
INTRAMUSCULAR | Status: AC
  Filled 2018-09-26: qty 10

## 2018-09-26 MED ORDER — DORZOLAMIDE HCL-TIMOLOL MAL 2-0.5 % OP SOLN
OPHTHALMIC | Status: AC
  Filled 2018-09-26: qty 10

## 2018-09-26 MED ORDER — PHENYLEPHRINE 40 MCG/ML (10ML) SYRINGE FOR IV PUSH (FOR BLOOD PRESSURE SUPPORT)
PREFILLED_SYRINGE | INTRAVENOUS | Status: AC
  Filled 2018-09-26: qty 10

## 2018-09-26 MED ORDER — BSS PLUS IO SOLN
INTRAOCULAR | Status: AC
  Filled 2018-09-26: qty 500

## 2018-09-26 MED ORDER — LIDOCAINE HCL 1 % IJ SOLN
INTRAMUSCULAR | Status: DC | PRN
  Administered 2018-09-26: 5 mL

## 2018-09-26 MED ORDER — ONDANSETRON HCL 4 MG/2ML IJ SOLN
INTRAMUSCULAR | Status: DC | PRN
  Administered 2018-09-26: 4 mg via INTRAVENOUS

## 2018-09-26 MED ORDER — LIDOCAINE HCL (PF) 4 % IJ SOLN
INTRAMUSCULAR | Status: AC
  Filled 2018-09-26: qty 5

## 2018-09-26 MED ORDER — EPHEDRINE 5 MG/ML INJ
INTRAVENOUS | Status: AC
  Filled 2018-09-26: qty 10

## 2018-09-26 MED ORDER — BSS IO SOLN
INTRAOCULAR | Status: AC
  Filled 2018-09-26: qty 500

## 2018-09-26 MED ORDER — GATIFLOXACIN 0.5 % OP SOLN
OPHTHALMIC | Status: DC | PRN
  Administered 2018-09-26: 1 [drp] via OPHTHALMIC

## 2018-09-26 MED ORDER — PREDNISOLONE ACETATE 1 % OP SUSP
OPHTHALMIC | Status: DC | PRN
  Administered 2018-09-26: 1 [drp] via OPHTHALMIC

## 2018-09-26 MED ORDER — MEPERIDINE HCL 50 MG/ML IJ SOLN: 6.2500 mg | INTRAMUSCULAR | Status: DC | PRN

## 2018-09-26 MED ORDER — ROCURONIUM BROMIDE 50 MG/5ML IV SOSY
PREFILLED_SYRINGE | INTRAVENOUS | Status: AC
  Filled 2018-09-26: qty 5

## 2018-09-26 MED ORDER — BACITRACIN-POLYMYXIN B 500-10000 UNIT/GM OP OINT
TOPICAL_OINTMENT | OPHTHALMIC | Status: DC | PRN
  Administered 2018-09-26: 1 via OPHTHALMIC

## 2018-09-26 MED ORDER — TRIAMCINOLONE ACETONIDE 40 MG/ML IJ SUSP
INTRAMUSCULAR | Status: AC
  Filled 2018-09-26: qty 5

## 2018-09-26 MED ORDER — TETRACAINE HCL 0.5 % OP SOLN
2.0000 [drp] | OPHTHALMIC | Status: AC
  Administered 2018-09-26: 2 [drp] via OPHTHALMIC
  Filled 2018-09-26: qty 2
  Filled 2018-09-26: qty 4
  Filled 2018-09-26: qty 2

## 2018-09-26 MED ORDER — PHENYLEPHRINE HCL 10 % OP SOLN
1.0000 [drp] | OPHTHALMIC | Status: AC | PRN
  Administered 2018-09-26 (×3): 1 [drp] via OPHTHALMIC
  Filled 2018-09-26: qty 5

## 2018-09-26 MED ORDER — ONDANSETRON HCL 4 MG/2ML IJ SOLN
INTRAMUSCULAR | Status: AC
  Filled 2018-09-26: qty 2

## 2018-09-26 MED ORDER — LIDOCAINE HCL 2 % IJ SOLN: INTRAMUSCULAR | Status: DC | PRN

## 2018-09-26 MED ORDER — LACTATED RINGERS IV SOLN
INTRAVENOUS | Status: DC | PRN
  Administered 2018-09-26 (×2): via INTRAVENOUS

## 2018-09-26 MED ORDER — BRIMONIDINE TARTRATE 0.2 % OP SOLN
OPHTHALMIC | Status: AC
  Filled 2018-09-26: qty 5

## 2018-09-26 MED ORDER — BUPIVACAINE HCL (PF) 0.75 % IJ SOLN
INTRAMUSCULAR | Status: DC | PRN
  Administered 2018-09-26: 5 mL

## 2018-09-26 SURGICAL SUPPLY — 71 items
APPLICATOR COTTON TIP 6 STRL (MISCELLANEOUS) ×3 IMPLANT
APPLICATOR COTTON TIP 6IN STRL (MISCELLANEOUS) ×6
BAND SCLERAL BUCKLING TYPE 41 (Ophthalmic Related) ×2 IMPLANT
BANDAGE EYE OVAL (MISCELLANEOUS) IMPLANT
BETADINE 5% OPHTHALMIC (OPHTHALMIC) IMPLANT
BLADE EYE CATARACT 19 1.4 BEAV (BLADE) IMPLANT
BLADE MVR KNIFE 19G (BLADE) IMPLANT
CABLE BIPOLOR RESECTION CORD (MISCELLANEOUS) ×2 IMPLANT
CANNULA DUAL BORE 23G (CANNULA) IMPLANT
CANNULA DUALBORE 25G (CANNULA) ×2 IMPLANT
CANNULA FLEX TIP 25G (CANNULA) IMPLANT
CANNULA VLV SOFT TIP 25GA (OPHTHALMIC) IMPLANT
COVER SURGICAL LIGHT HANDLE (MISCELLANEOUS) IMPLANT
COVER WAND RF STERILE (DRAPES) IMPLANT
DRAPE MICROSCOPE LEICA 46X105 (MISCELLANEOUS) IMPLANT
DRAPE OPHTHALMIC 77X100 STRL (CUSTOM PROCEDURE TRAY) ×2 IMPLANT
ERASER HMR WETFIELD 23G BP (MISCELLANEOUS) IMPLANT
FILTER BLUE MILLIPORE (MISCELLANEOUS) IMPLANT
FILTER STRAW FLUID ASPIR (MISCELLANEOUS) IMPLANT
FORCEPS GRIESHABER ILM 25G A (INSTRUMENTS) IMPLANT
GAS AUTO FILL CONSTEL (OPHTHALMIC) ×2
GAS AUTO FILL CONSTELLATION (OPHTHALMIC) ×1 IMPLANT
GLOVE BIO SURGEON STRL SZ7.5 (GLOVE) ×4 IMPLANT
GLOVE BIOGEL M 7.0 STRL (GLOVE) IMPLANT
GOWN STRL REUS W/ TWL LRG LVL3 (GOWN DISPOSABLE) IMPLANT
GOWN STRL REUS W/ TWL XL LVL3 (GOWN DISPOSABLE) IMPLANT
GOWN STRL REUS W/TWL LRG LVL3 (GOWN DISPOSABLE)
GOWN STRL REUS W/TWL XL LVL3 (GOWN DISPOSABLE)
ILLUMINATOR ENDO 25GA (MISCELLANEOUS) ×2 IMPLANT
KIT BASIN OR (CUSTOM PROCEDURE TRAY) IMPLANT
KIT PERFLUORON PROCEDURE 5ML (MISCELLANEOUS) ×2 IMPLANT
KNIFE CRESCENT 1.75 EDGEAHEAD (BLADE) IMPLANT
KNIFE GRIESHABER SHARP 2.5MM (MISCELLANEOUS) IMPLANT
LENS BIOM SUPER VIEW SET DISP (OPHTHALMIC RELATED) IMPLANT
LENS VITRECTOMY FLAT OCLR DISP (MISCELLANEOUS) IMPLANT
MICROPICK 25G (MISCELLANEOUS)
NEEDLE 18GX1X1/2 (RX/OR ONLY) (NEEDLE) IMPLANT
NEEDLE 25GX 5/8IN NON SAFETY (NEEDLE) ×4 IMPLANT
NEEDLE HYPO 30X.5 LL (NEEDLE) IMPLANT
NS IRRIG 1000ML POUR BTL (IV SOLUTION) IMPLANT
OPHTHALMIC BETADINE 5% (OPHTHALMIC)
PACK VITRECTOMY CUSTOM (CUSTOM PROCEDURE TRAY) IMPLANT
PAD ARMBOARD 7.5X6 YLW CONV (MISCELLANEOUS) IMPLANT
PAK PIK VITRECTOMY CVS 25GA (OPHTHALMIC) IMPLANT
PIC ILLUMINATED 25G (OPHTHALMIC)
PICK MICROPICK 25G (MISCELLANEOUS) IMPLANT
PIK ILLUMINATED 25G (OPHTHALMIC) IMPLANT
PROBE DIATHERMY DSP 27GA (MISCELLANEOUS) IMPLANT
PROBE ENDO DIATHERMY 25G (MISCELLANEOUS) ×2 IMPLANT
PROBE LASER ILLUM FLEX CVD 25G (OPHTHALMIC) IMPLANT
REPL STRA BRUSH NEEDLE (NEEDLE) IMPLANT
RESERVOIR BACK FLUSH (MISCELLANEOUS) IMPLANT
SCRAPER DIAMOND 25GA (OPHTHALMIC RELATED) IMPLANT
SHIELD EYE LENSE ONLY DISP (GAUZE/BANDAGES/DRESSINGS) ×2 IMPLANT
STRIP CLOSURE SKIN 1/2X4 (GAUZE/BANDAGES/DRESSINGS) ×2 IMPLANT
SUT ETHILON 5.0 S-24 (SUTURE) IMPLANT
SUT ETHILON 9 0 TG140 8 (SUTURE) IMPLANT
SUT SILK 2 0 (SUTURE) ×2
SUT SILK 2 0 TIES 17X18 (SUTURE)
SUT SILK 2-0 18XBRD TIE 12 (SUTURE) ×2 IMPLANT
SUT SILK 2-0 18XBRD TIE BLK (SUTURE) IMPLANT
SUT VICRYL 7 0 TG140 8 (SUTURE) ×2 IMPLANT
SYR 10ML LL (SYRINGE) IMPLANT
SYR 20CC LL (SYRINGE) IMPLANT
SYR 5ML LL (SYRINGE) IMPLANT
SYR BULB 3OZ (MISCELLANEOUS) IMPLANT
SYR TB 1ML LUER SLIP (SYRINGE) IMPLANT
TOWEL NATURAL 6PK STERILE (DISPOSABLE) IMPLANT
TRAY FOLEY CATH 14FR (SET/KITS/TRAYS/PACK) IMPLANT
TUBING HIGH PRESS EXTEN 6IN (TUBING) IMPLANT
WATER STERILE IRR 1000ML POUR (IV SOLUTION) IMPLANT

## 2018-09-26 NOTE — Transfer of Care (Signed)
Immediate Anesthesia Transfer of Care Note  Patient: Samuel [name]  Procedure(s) Performed: VITRECTOMY 25 GAUGE (Right Eye) Scleral Buckle (Right Eye) Gas/Fluid Exchange (Right Eye) Photocoagulation With Laser (Right Eye)  Patient Location: PACU  Anesthesia Type:General  Level of Consciousness: drowsy and patient cooperative  Airway & Oxygen Therapy: Patient Spontanous Breathing and Patient connected to face mask oxygen  Post-op Assessment: Report given to RN and Post -op Vital signs reviewed and stable  Post vital signs: Reviewed and stable  Last Vitals:  Vitals Value Taken Time  BP 145/72 09/26/2018  3:00 PM  Temp 36.3 C 09/26/2018  3:00 PM  Pulse 88 09/26/2018  3:02 PM  Resp 19 09/26/2018  3:02 PM  SpO2 94 % 09/26/2018  3:02 PM  Vitals shown include unvalidated device data.  Last Pain:  Vitals:   09/26/18 1019  TempSrc: Oral  PainSc: 0-No pain         Complications: No apparent anesthesia complications

## 2018-09-26 NOTE — Progress Notes (Addendum)
[facility] Note  09/26/2018     CHIEF COMPLAINT Patient presents for Retina Evaluation   HISTORY OF PRESENT ILLNESS: Samuel [name] is a 68 y.o. male who presents to the clinic today for:   HPI    Retina Evaluation    In right eye.  This started days ago.  Duration of days.  Associated Symptoms Floaters.  Context:  distance vision.  I, the attending physician,  performed the HPI with the patient and updated documentation appropriately.          Comments    NP retina eval (Referred by Dr. Marica Otter for RD OD).  Patient states he has a shade or curtain like film over his right eye.  He states he can see through it but vision is not nearly as good as it has been in the past OD.  Patient complains of an increase in floaters OD since yesterday.  Patient denies flashes of light.  Patient had cataract surgery 10+ years ago, performed by Dr. Dolores Lory.  Patient normally wears a single contact lens in his right eye for reading.       Last edited by Bernarda Caffey, MD on 09/26/2018  8:20 AM. (History)    pt states he saw Dr. Marica Otter yesterday, he states yesterday when he looked down things looked wavy like he was looking through water, he states he has had bad floaters for a while, he states over the past several weeks he has noticed his vision has not been as good and he's noticed a few flashes of light, pt states Dr. Dolores Lory did his cataract sx about 10 years ago  Referring physician: Marica Otter, OD [address], [place] [address]  HISTORICAL INFORMATION:   Selected notes from the MEDICAL RECORD NUMBER Referred by Dr. Marica Otter for concern of RD OD LEE: 3.18.19 Ammie Ferrier)  Ocular Hx- PMH-    CURRENT MEDICATIONS: No current outpatient medications on file. (Ophthalmic Drugs)   No current facility-administered medications for this visit.  (Ophthalmic Drugs)   Current Outpatient Medications (Other)  Medication Sig  . clobetasol  (OLUX) 0.05 % topical foam   . hydrochlorothiazide (HYDRODIURIL) 25 MG tablet TAKE 1 TABLET BY MOUTH DAILY  . HYDROcodone-acetaminophen (NORCO/VICODIN) 5-325 MG tablet Take 1 tablet by mouth every 4 (four) hours as needed for moderate pain.  Marland Kitchen lisinopril (PRINIVIL,ZESTRIL) 20 MG tablet TAKE 1 TABLET BY MOUTH DAILY  . metoprolol tartrate (LOPRESSOR) 50 MG tablet TAKE 1 TABLET BY MOUTH EVERY DAY  . sildenafil (VIAGRA) 100 MG tablet Take 0.5-1 tablets (50-100 mg total) by mouth daily as needed for erectile dysfunction.   No current facility-administered medications for this visit.  (Other)   Facility-Administered Medications Ordered in Other Visits (Other)  Medication Route  . 0.9 %  sodium chloride infusion Intravenous  . fentaNYL (SUBLIMAZE) injection 25-50 mcg Intravenous  . meperidine (DEMEROL) injection 6.25-12.5 mg Intravenous  . promethazine (PHENERGAN) injection 6.25-12.5 mg Intravenous      REVIEW OF SYSTEMS: ROS    Positive for: Eyes   Negative for: Constitutional, Gastrointestinal, Neurological, Skin, Genitourinary, Musculoskeletal, HENT, Endocrine, Cardiovascular, Respiratory, Psychiatric, Allergic/Imm, Heme/Lymph   Last edited by Doneen Poisson on 09/26/2018  7:54 AM. (History)       ALLERGIES Allergies  Allergen Reactions  . Penicillins     REACTION: hives    PAST MEDICAL HISTORY Past Medical History:  Diagnosis Date  . ABNORMAL ELECTROCARDIOGRAM 12/21/2009  .  EPISTAXIS, RECURRENT 12/21/2009  . ESSENTIAL HYPERTENSION 12/21/2009  . VENTRICULAR HYPERTROPHY, LEFT 01/04/2010   Past Surgical History:  Procedure Laterality Date  . EYE SURGERY  2009   catarac    FAMILY HISTORY Family History  Problem Relation Age of Onset  . Cancer Mother        bladder CA  . Hypertension Mother   . Hypertension Father   . Heart disease Father   . Heart disease Maternal Grandfather   . Hypertension Maternal Grandfather   . Cancer Paternal Grandmother        ovarian CA  .  Heart disease Paternal Grandfather   . Hypertension Paternal Grandfather     SOCIAL HISTORY Social History   Tobacco Use  . Smoking status: Light Tobacco Smoker    Packs/day: 1.00    Years: 18.00    Pack years: 18.00    Types: Cigars    Last attempt to quit: 08/22/1988    Years since quitting: 30.1  . Smokeless tobacco: Never Used  Substance Use Topics  . Alcohol use: Yes  . Drug use: Not on file         OPHTHALMIC EXAM:  Base Eye Exam    Visual Acuity ( Snellen - Linear)      Right Left   Dist [place] 20/70 -1 20/25 -3   Dist ph [place] 20/40 -2 NI       Tonometry (Tonopen, 8:03 AM)      Right Left   Pressure 16 18       Pupils      Pupils Dark Light Shape React APD   Right PERRL 3 2 Round Minimal 0   Left PERRL 3 2 Round Minimal 0       Visual Fields      Left Right    Full Full       Neuro/Psych    Oriented x3:  Yes   Mood/Affect:  Normal       Dilation    Both eyes:  1.0% Mydriacyl, 2.5% Phenylephrine @ 8:03 AM        Slit Lamp and Fundus Exam    Slit Lamp Exam      Right Left   Lids/Lashes Dermatochalasis - upper lid Dermatochalasis - upper lid   Conjunctiva/Sclera White and quiet White and quiet   Cornea mild Arcus, 1+ Punctate epithelial erosions mild Arcus, 1+ Punctate epithelial erosions   Anterior Chamber Deep and quiet Deep and quiet   Iris Round and dilated Round and dilated   Lens 3 piece IOL, open PC, ?sulcus 3 piece IOL, open PC, ?sulcus   Vitreous Vitreous syneresis, +tobacco dusting Vitreous syneresis, vitreous condensations       Fundus Exam      Right Left   Disc Pink and Sharp Pink and Sharp   C/D Ratio 0.5 0.5   Macula +SRF inferior macula, +corrugations Flat, Good foveal reflex, Retinal pigment epithelial mottling, trace Epiretinal membrane   Vessels Vascular attenuation, Tortuousity Vascular attenuation, Tortuous   Periphery bullous inferior detachment from 0230-0930, ?holes in IT quadrant Attached, peripheral cystoid  degeneration, no RT/RD on 360 scleral depression        Refraction    Wearing Rx      Sphere   Right None   Left None       Manifest Refraction      Sphere Cylinder Dist VA   Right -0.75 Sphere 20/40-2   Left -0.50 Sphere 20/20  IMAGING AND PROCEDURES  Imaging and Procedures for _0 @  Anesthesia Airway          Procedure Name: Intubation Date/Time: 09/26/2018 12:19 PM Performed by: Kathryne Hitch, CRNA Pre-anesthesia Checklist: Patient identified, Emergency Drugs available, Suction available, Patient being monitored and Timeout performed Patient Re-evaluated:Patient Re-evaluated prior to induction Oxygen Delivery Method: Circle system utilized Preoxygenation: Pre-oxygenation with 100% oxygen Induction Type: IV induction and Rapid sequence Laryngoscope Size: Glidescope Sabra Heck 2 Grade 3 view) Grade View: Grade I Tube type: Oral Tube size: 7.5 mm Number of attempts: 2 Airway Equipment and Method: Stylet Placement Confirmation: ETT inserted through vocal cords under direct vision,  positive ETCO2 and breath sounds checked- equal and bilateral Secured at: 22 cm Tube secured with: Tape Dental Injury: Teeth and Oropharynx as per pre-operative assessment                Basic metabolic panel     Component Value Flag Ref Range Units Status   Sodium 138    135 - 145 mmol/L Final   Potassium 4.3    3.5 - 5.1 mmol/L Final   Chloride 106    98 - 111 mmol/L Final   CO2 24    22 - 32 mmol/L Final   Glucose, Bld 108    70 - 99 mg/dL Final   BUN 16    8 - 23 mg/dL Final   Creatinine, Ser 1.13    0.61 - 1.24 mg/dL Final   Calcium 9.7    8.9 - 10.3 mg/dL Final   GFR calc non Af Amer >60    >60 mL/min Final   GFR calc Af Amer >60    >60 mL/min Final   Anion gap 8    5 - 15  Final   Comment:   Performed at [facility], [address] [place], [place] [address]        CBC     Component Value Flag Ref Range Units Status   WBC 8.9    4.0 -  10.5 K/uL Final   RBC 5.30    4.22 - 5.81 MIL/uL Final   Hemoglobin 16.2    13.0 - 17.0 g/dL Final   HCT 50.9    39.0 - 52.0 % Final   MCV 96.0    80.0 - 100.0 fL Final   MCH 30.6    26.0 - 34.0 pg Final   MCHC 31.8    30.0 - 36.0 g/dL Final   RDW 12.6    11.5 - 15.5 % Final   Platelets 274    150 - 400 K/uL Final   nRBC 0.0    0.0 - 0.2 % Final   Comment:   Performed at [facility], [place] [address], [place], [place] [address]        OCT, Retina - OU - Both Eyes       Right Eye Quality was good. Central Foveal Thickness: 643. Progression has no prior data. Findings include abnormal foveal contour, subretinal fluid, intraretinal fluid.   Left Eye Quality was good. Central Foveal Thickness: 311. Progression has no prior data. Findings include normal foveal contour, no SRF, no IRF (Trace ERM).   Notes *Images captured and stored on drive  Diagnosis / Impression:  OD: fovea involving inferior retinal detachment OS: NFP, no IRF/SRF   Clinical management:  See below  Abbreviations: NFP - Normal foveal profile. CME - cystoid macular edema. PED - pigment epithelial detachment. IRF -  intraretinal fluid. SRF - subretinal fluid. EZ - ellipsoid zone. ERM - epiretinal membrane. ORA - outer retinal atrophy. ORT - outer retinal tubulation. SRHM - subretinal hyper-reflective material                 ASSESSMENT/PLAN:    ICD-10-CM   1. 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 Retinal edema H35.81 OCT, Retina - OU - Both Eyes  3. Epiretinal membrane (ERM) of left eye H35.372   4. Pseudophakia of both eyes Z96.1     1,2. Rhegmatogenous retinal detachment, RIGHT EYE  - bullous inferior mac off detachment, [id number], fovea off  - onset of foveal involvement Wednesday, 03.18.20 by history  - The incidence, risk factors, and natural history of retinal detachment was discussed with patient.  Potential treatment options including delimiting laser, pneumatic retinopexy, scleral  buckle, and vitrectomy, cryotherapy and laser, and the use of air, gas, and oil discussed with patient.  The risks of blindness, loss of vision, infection, hemorrhage, cataract progression or lens displacement were discussed with patient.  - recommend emergent SBP + 25g PPV/EL/Gas OD under general anesthesia  - pt wishes to proceed with surgery  - RBA of procedure discussed, questions answered  - informed consent obtained and signed  - case scheduled for 03.19.20, 12 noon -- MC OR 8  - f/u POD1  3. Epiretinal membrane, OS   - The natural history, anatomy, potential for loss of vision, and treatment options including vitrectomy techniques and the complications of endophthalmitis, retinal detachment, vitreous hemorrhage, cataract progression and permanent vision loss discussed with the patient.  - mild ERM  - asymptomatic, no metamorphopsia  - no indication for surgery at this time  - monitor for now  4. Pseudophakia OU  - s/p CE/IOL - Dr. Dolores Lory (2010)  - ?sulcus IOLs -- stable  - monitor   Ophthalmic Meds Ordered this visit:  No orders of the defined types were placed in this encounter.      Return in about 1 day (around 09/27/2018) for POV.  There are no Patient Instructions on file for this visit.   Explained the diagnoses, plan, and follow up with the patient and they expressed understanding.  Patient expressed understanding of the importance of proper follow up care.   This document serves as a record of services personally performed by Gardiner Sleeper, MD, PhD. It was created on their behalf by Ernest Mallick, OA, an ophthalmic assistant. The creation of this record is the provider's dictation and/or activities during the visit.    Electronically signed by: Ernest Mallick, OA  03.19.2020 11:06 PM    Gardiner Sleeper, M.D., Ph.D. Diseases & Surgery of the Retina and Vitreous [facility]  I have reviewed the above documentation for accuracy and  completeness, and I agree with the above. Gardiner Sleeper, M.D., Ph.D. 09/26/18 11:06 PM   Abbreviations: M myopia (nearsighted); A astigmatism; H hyperopia (farsighted); P presbyopia; Mrx spectacle prescription;  CTL contact lenses; OD right eye; OS left eye; OU both eyes  XT exotropia; ET esotropia; PEK punctate epithelial keratitis; PEE punctate epithelial erosions; DES dry eye syndrome; MGD meibomian gland dysfunction; ATs artificial tears; PFAT's preservative free artificial tears; [place] nuclear sclerotic cataract; PSC posterior subcapsular cataract; ERM epi-retinal membrane; PVD posterior vitreous detachment; RD retinal detachment; DM diabetes mellitus; DR diabetic retinopathy; NPDR non-proliferative diabetic retinopathy; PDR proliferative diabetic retinopathy; CSME clinically significant macular edema; DME diabetic macular edema; dbh dot blot hemorrhages; CWS cotton  wool spot; POAG primary open angle glaucoma; C/D cup-to-disc ratio; HVF humphrey visual field; GVF goldmann visual field; OCT optical coherence tomography; IOP intraocular pressure; BRVO Branch retinal vein occlusion; CRVO central retinal vein occlusion; CRAO central retinal artery occlusion; BRAO branch retinal artery occlusion; RT retinal tear; SB scleral buckle; PPV pars plana vitrectomy; VH Vitreous hemorrhage; PRP panretinal laser photocoagulation; IVK intravitreal kenalog; VMT vitreomacular traction; MH Macular hole;  NVD neovascularization of the disc; NVE neovascularization elsewhere; AREDS age related eye disease study; ARMD age related macular degeneration; POAG primary open angle glaucoma; EBMD epithelial/anterior basement membrane dystrophy; ACIOL anterior chamber intraocular lens; IOL intraocular lens; PCIOL posterior chamber intraocular lens; Phaco/IOL phacoemulsification with intraocular lens placement; [place] photorefractive keratectomy; LASIK laser assisted in situ keratomileusis; HTN hypertension; DM diabetes mellitus; COPD  chronic obstructive pulmonary disease

## 2018-09-26 NOTE — H&P (Signed)
Samuel [name] is an 68 y.o. male.    Chief Complaint: retinal detachment, right eye  HPI: 1 day history of decreased central vision OD. Weeks long history of floaters OU. On dilated exam, found to have fovea and macula involving retinal detachment, right eye. After discussion of treatment options, pt elects to proceed with urgent surgical repair of retinal detachment, right eye  Past Medical History:  Diagnosis Date  . ABNORMAL ELECTROCARDIOGRAM 12/21/2009  . EPISTAXIS, RECURRENT 12/21/2009  . ESSENTIAL HYPERTENSION 12/21/2009  . VENTRICULAR HYPERTROPHY, LEFT 01/04/2010    Past Surgical History:  Procedure Laterality Date  . EYE SURGERY  2009   catarac    Family History  Problem Relation Age of Onset  . Cancer Mother        bladder CA  . Hypertension Mother   . Hypertension Father   . Heart disease Father   . Heart disease Maternal Grandfather   . Hypertension Maternal Grandfather   . Cancer Paternal Grandmother        ovarian CA  . Heart disease Paternal Grandfather   . Hypertension Paternal Grandfather    Social History:  reports that he has been smoking cigars. He has a 18.00 pack-year smoking history. He has never used smokeless tobacco. He reports current alcohol use. No history on file for drug.  Allergies:  Allergies  Allergen Reactions  . Penicillins     REACTION: hives    No medications prior to admission.    Review of systems otherwise negative  There were no vitals taken for this visit.  Physical exam: Mental status: oriented x3. Eyes: See eye exam associated with this date of surgery Ears, Nose, Throat: within normal limits Neck: Within Normal limits General: within normal limits Chest: Within normal limits Breast: deferred Heart: Within normal limits Abdomen: Within normal limits GU: deferred Extremities: within normal limits Skin: within normal limits  Assessment/Plan 1. Retinal detachment, right eye  Plan: To [facility] for SBP + 25g  PPV w/ endolaser and gas, right eye, under general anesthesia - case scheduled for 12 noon today, OR 8  Gardiner Sleeper, M.D., Ph.D. Vitreoretinal Surgeon [facility]

## 2018-09-26 NOTE — Anesthesia Procedure Notes (Signed)
Procedure Name: Intubation Date/Time: 09/26/2018 12:19 PM Performed by: Kathryne Hitch, CRNA Pre-anesthesia Checklist: Patient identified, Emergency Drugs available, Suction available, Patient being monitored and Timeout performed Patient Re-evaluated:Patient Re-evaluated prior to induction Oxygen Delivery Method: Circle system utilized Preoxygenation: Pre-oxygenation with 100% oxygen Induction Type: IV induction and Rapid sequence Laryngoscope Size: Glidescope Sabra Heck 2 Grade 3 view) Grade View: Grade I Tube type: Oral Tube size: 7.5 mm Number of attempts: 2 Airway Equipment and Method: Stylet Placement Confirmation: ETT inserted through vocal cords under direct vision,  positive ETCO2 and breath sounds checked- equal and bilateral Secured at: 22 cm Tube secured with: Tape Dental Injury: Teeth and Oropharynx as per pre-operative assessment

## 2018-09-26 NOTE — Op Note (Signed)
Date of procedure: 3.19.2020   Surgeon: Bernarda Caffey, MD, PhD   Assistant: Ernest Mallick, Ophthalmic Assistant   Pre-operative Diagnosis: Macula involving Rhegmatogenous Retinal Detachment, Right Eye   Post-operative diagnosis: Macula involving Rhegmatogenous Retinal Detachment, Right Eye   Anesthesia: GETA   Procedures: 1)     Scleral Buckle, Right Eye 2)     25 gauge pars plana vitrectomy, Right Eye CPT [id number] 3)     Perfluorocarbon injection 4)     Fluid-air exchange, Right Eye 5)     Endolaser, Right Eye 5)     Injection of 66% Z9D3 gas   Complications: none Estimated blood loss: minimal Specimens: none   Brief history:   The patient has a history of decreased vision in the affected right eye, and on examination, was noted to have a macula-involving retinal detachment, affecting activities of daily living.  The risks, benefits, and alternatives were explained to the patient, including pain, bleeding, infection, loss of vision, double vision, droopy eyelids, and need for more surgeries.  Informed consent was obtained from the patient and placed in the chart.     Procedure:             The patient was brought to the preoperative holding area where the correct eye was confirmed and marked.  The patient was then brought to the operating room where general endotracheal anesthesia was induced. A secondary time-out was performed to identify the correct patient, eye, procedures, and any allergies. The right eye was prepped and draped in the usual sterile ophthalmic fashion followed by placement of a lid speculum.             A 360 conjunctival peritomy was created using Westcott scissors and 0.12 forceps. Each of the four quadrants between the rectus muscles was dissected using Stevens scissors to detach Tenon's attachments from the globe. Each of the four rectus muscles was isolated on a muscle hook and slung using 2-0 Silk suture in the usual standard fashion. Each of the four quadrants  between the rectus muscles was inspected. The superotemporal quadrant had some areas of mild scleral thinning. A #57 silicone band was then brought onto the field and was threaded under each rectus muscle. The band was then loosely secured using a #70 Watzke sleeve in the inferotemporal quadrant. The band was then sutured to the sclera in each quadrant using 5-0 nylon sutures passed partial thickness through the sclera in a horizontal mattress fashion. The scleral buckle was then tightened to the appropriate height with two locking needle drivers. Attention was then turned to the vitrectomy portion of the procedure.              A 25 gauge trocar was placed in the inferotemporal quadrant in a beveled fashion. A 4 mm infusion cannula was placed through this trocar, and the infusion cannula was confirmed in the vitreous cavity with no incarceration of retina or choroid prior to turning it on. Two additional 25 gauge trocars were placed in the superonasal and superotemporal quadrants (2 and 10 oclock, respectively) in a similar beveled fashion. At this time, a standard three-port pars plana vitrectomy was performed using the light pipe, the cutter, and the BIOM viewing system. A thorough anterior, core and peripheral vitreous dissection was performed. A posterior vitreous detachment was confirmed over the optic nerve. There was a bullous inferior retinal detachment extending from 0300 to 0930 w/ macular involvement. The fovea was detached. There was a very peripheral retinal hole within the  detached retina at 0900.             Traction was removed from all retinal breaks. The breaks were trimmed using the cutter to smooth the edges. Perfluoron was injected to push the subretinal fluid anterior to the scleral buckle.  A complete fluid-air exchange was performed with a soft tip extrusion cannula over the 0900 break, then posteriorly to remove the perfluoron. After completion of the fluid-air exchange, the retina was  flat over the macula and over the scleral buckle. Under air, endolaser was applied to the breaks and over and posterior to the scleral buckle 360.             At this time, the buckle height was confirmed and the buckle was finalized by trimming the band ends. The superotemporal trocar was removed and sutured with 7-0 vicryl in an interrupted fashion.  A complete air to 14% C3F8 gas exchange was performed through the infusion cannula and vented through the superonasal trocar using the extrusion cannula. The superonasal trocar and infusion cannula and associated trocar were then removed and sutured with 7-0 vicryl in an interrupted fashion. Kefzol + polymixin irrigation was then used over the buckle. A subtenon's block containing 0.75% marcaine and 2% lidocaine was administered.              The conjunctiva was closed with 7-0 vicryl sutures. The eye's intraocular pressure was confirmed to be at a physiologic level by digital palpation. Subconjunctival injections of Antibiotic and kenalog were administered. The lid speculum and drapes were removed. Drops of an antibiotic, antihypertensives, and steroid were given. Copious antibiotic ointment was instilled into the eye. The eye was patched and shielded. The patient tolerated the procedure well without any intraoperative or immediate postoperative complications. The patient was taken to the recovery room in good condition. The patient was instructed to maintain a strict face-down position and will be seen by Dr. Coralyn Pear tomorrow morning in clinic.

## 2018-09-26 NOTE — Anesthesia Preprocedure Evaluation (Addendum)
Anesthesia Evaluation  Patient identified by MRN, date of birth, ID band Patient awake    Reviewed: Allergy & Precautions, NPO status , Patient's Chart, lab work & pertinent test results, reviewed documented beta blocker date and time   Airway Mallampati: II  TM Distance: >3 FB Neck ROM: Full    Dental  (+) Dental Advisory Given, Teeth Intact   Pulmonary neg pulmonary ROS, Current Smoker,    Pulmonary exam normal breath sounds clear to auscultation       Cardiovascular hypertension, Pt. on medications and Pt. on home beta blockers Normal cardiovascular exam Rhythm:Regular Rate:Normal     Neuro/Psych negative neurological ROS  negative psych ROS   GI/Hepatic negative GI ROS, Neg liver ROS,   Endo/Other  negative endocrine ROS  Renal/GU negative Renal ROS     Musculoskeletal negative musculoskeletal ROS (+)   Abdominal (+) + obese,   Peds  Hematology negative hematology ROS (+)   Anesthesia Other Findings   Reproductive/Obstetrics                            Anesthesia Physical Anesthesia Plan  ASA: III  Anesthesia Plan: General   Post-op Pain Management:    Induction: Intravenous  PONV Risk Score and Plan: Treatment may vary due to age or medical condition, Ondansetron and Dexamethasone  Airway Management Planned: Oral ETT  Additional Equipment: None  Intra-op Plan:   Post-operative Plan: Extubation in OR  Informed Consent: I have reviewed the patients History and Physical, chart, labs and discussed the procedure including the risks, benefits and alternatives for the proposed anesthesia with the patient or authorized representative who has indicated his/her understanding and acceptance.     Dental advisory given  Plan Discussed with: CRNA  Anesthesia Plan Comments:         Anesthesia Quick Evaluation

## 2018-09-26 NOTE — Brief Op Note (Signed)
09/26/2018  3:16 PM  PATIENT:  Samuel [name]  68 y.o. male  PRE-OPERATIVE DIAGNOSIS:  right retinal detachment  POST-OPERATIVE DIAGNOSIS:  right retinal detachment  PROCEDURE:  Procedure(s): VITRECTOMY 25 GAUGE (Right) Scleral Buckle (Right) Gas/Fluid Exchange (Right) Photocoagulation With Laser (Right)  SURGEON:  Surgeon(s) and Role:    Bernarda Caffey, MD - Primary  ASSISTANTS: Ernest Mallick, Ophthalmic Assistant  ANESTHESIA:   local and general  EBL:  20 mL   BLOOD ADMINISTERED:none  DRAINS: none   LOCAL MEDICATIONS USED:  BUPIVICAINE , LIDOCAINE  and Amount: 10 ml  SPECIMEN:  No Specimen  DISPOSITION OF SPECIMEN:  N/A  COUNTS:  YES  TOURNIQUET:  * No tourniquets in log *  DICTATION: .Note written in EPIC  PLAN OF CARE: Discharge to home after PACU  PATIENT DISPOSITION:  PACU - hemodynamically stable.   Delay start of Pharmacological VTE agent (>24hrs) due to surgical blood loss or risk of bleeding: not applicable

## 2018-09-26 NOTE — Discharge Instructions (Signed)
POSTOPERATIVE INSTRUCTIONS  Your doctor has performed vitreoretinal surgery on you at [facility]. [place] eye patched and shielded until seen by Dr. Coralyn Pear 9 AM tomorrow in clinic - Do not use drops until return - [place] - Sleep with belly down or on left side, avoid laying flat on back.    - No strenuous bending, stooping or lifting.  - You may not drive until further notice.  - If your doctor used a gas bubble in your eye during the procedure he will advise you on postoperative positioning. If you have a gas bubble you will be wearing a green bracelet that was applied in the operating room. The green bracelet should stay on as long as the gas bubble is in your eye. While the gas bubble is present you should not fly in an airplane. If you require general anesthesia while the gas bubble is present you must notify your anesthesiologist that an intraocular gas bubble is present so he can take the appropriate precautions.  - Tylenol or any other over-the-counter pain reliever can be used according to your doctor. If more pain medicine is required, your doctor will have a prescription for you.  - You may read, go up and down stairs, and watch television.     Bernarda Caffey, M.D., Ph.D.

## 2018-09-27 ENCOUNTER — Encounter (INDEPENDENT_AMBULATORY_CARE_PROVIDER_SITE_OTHER): Payer: Self-pay | Admitting: Ophthalmology

## 2018-09-27 ENCOUNTER — Ambulatory Visit (INDEPENDENT_AMBULATORY_CARE_PROVIDER_SITE_OTHER): Payer: Medicare Other | Admitting: Ophthalmology

## 2018-09-27 DIAGNOSIS — H3581 Retinal edema: Secondary | ICD-10-CM

## 2018-09-27 DIAGNOSIS — H35372 Puckering of macula, left eye: Secondary | ICD-10-CM

## 2018-09-27 DIAGNOSIS — Z961 Presence of intraocular lens: Secondary | ICD-10-CM

## 2018-09-27 DIAGNOSIS — H3321 Serous retinal detachment, right eye: Secondary | ICD-10-CM

## 2018-09-27 NOTE — Progress Notes (Signed)
[facility] Note  09/27/2018     CHIEF COMPLAINT Patient presents for Post-op Follow-up   HISTORY OF PRESENT ILLNESS: Samuel [name] is a 68 y.o. male who presents to the clinic today for:   HPI    Post-op Follow-up    In right eye.  Discomfort includes Negative for pain, itching, foreign body sensation, tearing, discharge, floaters and none.  Vision is stable.  I, the attending physician,  performed the HPI with the patient and updated documentation appropriately.          Comments    1 day PO s/p Rd od. Patient states no pain, little sensitive.         Last edited by Bernarda Caffey, MD on 09/27/2018  9:14 AM. (History)    pt states he saw Dr. Marica Otter yesterday, he states yesterday when he looked down things looked wavy like he was looking through water, he states he has had bad floaters for a while, he states over the past several weeks he has noticed his vision has not been as good and he's noticed a few flashes of light, pt states Dr. Dolores Lory did his cataract sx about 10 years ago  Referring physician: Eulas Post, MD [place], [place] [address]  HISTORICAL INFORMATION:   Selected notes from the [facility] Referred by Dr. Marica Otter for concern of RD OD LEE: 3.18.19 Ammie Ferrier)  Ocular Hx- PMH-    CURRENT MEDICATIONS: No current outpatient medications on file. (Ophthalmic Drugs)   No current facility-administered medications for this visit.  (Ophthalmic Drugs)   Current Outpatient Medications (Other)  Medication Sig  . clobetasol (OLUX) 0.05 % topical foam   . hydrochlorothiazide (HYDRODIURIL) 25 MG tablet TAKE 1 TABLET BY MOUTH DAILY  . HYDROcodone-acetaminophen (NORCO/VICODIN) 5-325 MG tablet Take 1 tablet by mouth every 4 (four) hours as needed for moderate pain.  Marland Kitchen lisinopril (PRINIVIL,ZESTRIL) 20 MG tablet TAKE 1 TABLET BY MOUTH DAILY  . metoprolol tartrate (LOPRESSOR) 50 MG tablet TAKE 1  TABLET BY MOUTH EVERY DAY  . sildenafil (VIAGRA) 100 MG tablet Take 0.5-1 tablets (50-100 mg total) by mouth daily as needed for erectile dysfunction.   No current facility-administered medications for this visit.  (Other)      REVIEW OF SYSTEMS: ROS    Positive for: Eyes   Negative for: Constitutional, Gastrointestinal, Neurological, Skin, Genitourinary, Musculoskeletal, HENT, Endocrine, Cardiovascular, Respiratory, Psychiatric, Allergic/Imm, Heme/Lymph   Last edited by Elmore Guise on 09/27/2018  9:04 AM. (History)       ALLERGIES Allergies  Allergen Reactions  . Penicillins     REACTION: hives    PAST MEDICAL HISTORY Past Medical History:  Diagnosis Date  . ABNORMAL ELECTROCARDIOGRAM 12/21/2009  . EPISTAXIS, RECURRENT 12/21/2009  . ESSENTIAL HYPERTENSION 12/21/2009  . VENTRICULAR HYPERTROPHY, LEFT 01/04/2010   Past Surgical History:  Procedure Laterality Date  . EYE SURGERY  2009   catarac  . GAS/FLUID EXCHANGE Right 09/26/2018   Procedure: Gas/Fluid Exchange;  Surgeon: Bernarda Caffey, MD;  Location: [facility];  Service: Ophthalmology;  Laterality: Right;  . PHOTOCOAGULATION WITH LASER Right 09/26/2018   Procedure: Photocoagulation With Laser;  Surgeon: Bernarda Caffey, MD;  Location: [facility];  Service: Ophthalmology;  Laterality: Right;  . SCLERAL BUCKLE Right 09/26/2018   Procedure: Scleral Buckle;  Surgeon: Bernarda Caffey, MD;  Location: [facility];  Service: Ophthalmology;  Laterality: Right;  . VITRECTOMY 25 GAUGE WITH SCLERAL BUCKLE Right 09/26/2018  Procedure: VITRECTOMY 25 GAUGE;  Surgeon: Bernarda Caffey, MD;  Location: [facility];  Service: Ophthalmology;  Laterality: Right;    FAMILY HISTORY Family History  Problem Relation Age of Onset  . Cancer Mother        bladder CA  . Hypertension Mother   . Hypertension Father   . Heart disease Father   . Heart disease Maternal Grandfather   . Hypertension Maternal Grandfather   . Cancer Paternal Grandmother        ovarian CA  .  Heart disease Paternal Grandfather   . Hypertension Paternal Grandfather     SOCIAL HISTORY Social History   Tobacco Use  . Smoking status: Light Tobacco Smoker    Packs/day: 1.00    Years: 18.00    Pack years: 18.00    Types: Cigars    Last attempt to quit: 08/22/1988    Years since quitting: 30.1  . Smokeless tobacco: Never Used  Substance Use Topics  . Alcohol use: Yes  . Drug use: Not on file         OPHTHALMIC EXAM:  Base Eye Exam    Visual Acuity (Snellen - Linear)      Right Left   Dist [place] 20/CF 20/20-2       Pachymetry (09/27/2018)      Right Left   Thickness 20        Extraocular Movement      Right Left    Full Full       Neuro/Psych    Oriented x3:  Yes   Mood/Affect:  Normal        Slit Lamp and Fundus Exam    Slit Lamp Exam      Right Left   Lids/Lashes Dermatochalasis - upper lid Dermatochalasis - upper lid   Conjunctiva/Sclera 360 Subconjunctival hemorrhage, sutures intact White and quiet   Cornea mild Arcus, 1+ Punctate epithelial erosions mild Arcus, 1+ Punctate epithelial erosions   Anterior Chamber temporal AC is shallow with narrow temporal angle, otherwise deep; inferior IOL optic is anterior to iris Deep and quiet   Iris Round and dilated Round and dilated   Lens 3 piece sulcus IOL, open PC, inferior IOL optic is anterior to iris, central condensation on posterior capsule 3 piece IOL, open PC, ?sulcus   Vitreous post vitrectomy, good gas fill Vitreous syneresis, vitreous condensations       Fundus Exam      Right Left   Disc Pink and Sharp Pink and Sharp   C/D Ratio 0.5 0.5   Macula Flat and attached under gas Flat, Good foveal reflex, Retinal pigment epithelial mottling, trace Epiretinal membrane   Vessels Vascular attenuation, Tortuousity Vascular attenuation, Tortuous   Periphery Original detachment: bullous inferior detachment from 0230-0930; today: retina attached over buckle, good buckle height, good laser 360 Attached,  peripheral cystoid degeneration, no RT/RD on 360 scleral depression          IMAGING AND PROCEDURES  Imaging and Procedures for _0 @           ASSESSMENT/PLAN:    ICD-10-CM   1. 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 Retinal edema H35.81   3. Epiretinal membrane (ERM) of left eye H35.372   4. Pseudophakia of both eyes Z96.1     1,2. Rhegmatogenous retinal detachment, RIGHT EYE  - bullous inferior mac off detachment, [id number], fovea off  - onset of foveal involvement Wednesday, 03.18.20 by history  - POD1 s/p SBP + PPV/PFC/EL/FAX/14% C3F8 OD, 03.19.2020             -  doing well this morning             - retina attached and in good position -- good buckle height and laser around breaks  - inferior optic displaced anterior to iris -- discussed importance of face down positioning             - IOP good at 16             - start   PF 6x/day OD                         zymaxid QID OD                         Atropine BID OD                         Cosopt P BID OD                         PSO ung QID OD             - cont face down positioning x3 days; avoid laying flat on back             - eye shield when sleeping             - post op drop and positioning instructions reviewed             - tylenol/ibuprofen for pain             - Rx given for breakthrough pain  - f/u 1 week   3. Epiretinal membrane, OS   - The natural history, anatomy, potential for loss of vision, and treatment options including vitrectomy techniques and the complications of endophthalmitis, retinal detachment, vitreous hemorrhage, cataract progression and permanent vision loss discussed with the patient.  - mild ERM  - asymptomatic, no metamorphopsia  - no indication for surgery at this time  - monitor for now  4. Pseudophakia OU  - s/p CE/IOL - Dr. Dolores Lory (2010)  - ?sulcus IOLs -- OD displaced as discussed above  - monitor   Ophthalmic Meds Ordered this visit:  No orders of the  defined types were placed in this encounter.      Return in about 6 days (around 10/03/2018) for POV RD OD, DFE.  There are no Patient Instructions on file for this visit.   Explained the diagnoses, plan, and follow up with the patient and they expressed understanding.  Patient expressed understanding of the importance of proper follow up care.   This document serves as a record of services personally performed by Gardiner Sleeper, MD, PhD. It was created on their behalf by Ernest Mallick, OA, an ophthalmic assistant. The creation of this record is the provider's dictation and/or activities during the visit.    Electronically signed by: Ernest Mallick, OA  03.20.2020 12:17 PM     Gardiner Sleeper, M.D., Ph.D. Diseases & Surgery of the Retina and Vitreous [facility]  I have reviewed the above documentation for accuracy and completeness, and I agree with the above. Gardiner Sleeper, M.D., Ph.D. 09/27/18 12:17 PM    Abbreviations: M myopia (nearsighted); A astigmatism; H hyperopia (farsighted); P presbyopia; Mrx spectacle prescription;  CTL contact lenses; OD right eye; OS left eye; OU both eyes  XT exotropia; ET esotropia;  PEK punctate epithelial keratitis; PEE punctate epithelial erosions; DES dry eye syndrome; MGD meibomian gland dysfunction; ATs artificial tears; PFAT's preservative free artificial tears; [place] nuclear sclerotic cataract; PSC posterior subcapsular cataract; ERM epi-retinal membrane; PVD posterior vitreous detachment; RD retinal detachment; DM diabetes mellitus; DR diabetic retinopathy; NPDR non-proliferative diabetic retinopathy; PDR proliferative diabetic retinopathy; CSME clinically significant macular edema; DME diabetic macular edema; dbh dot blot hemorrhages; CWS cotton wool spot; POAG primary open angle glaucoma; C/D cup-to-disc ratio; HVF humphrey visual field; GVF goldmann visual field; OCT optical coherence tomography; IOP intraocular pressure; BRVO  Branch retinal vein occlusion; CRVO central retinal vein occlusion; CRAO central retinal artery occlusion; BRAO branch retinal artery occlusion; RT retinal tear; SB scleral buckle; PPV pars plana vitrectomy; VH Vitreous hemorrhage; PRP panretinal laser photocoagulation; IVK intravitreal kenalog; VMT vitreomacular traction; MH Macular hole;  NVD neovascularization of the disc; NVE neovascularization elsewhere; AREDS age related eye disease study; ARMD age related macular degeneration; POAG primary open angle glaucoma; EBMD epithelial/anterior basement membrane dystrophy; ACIOL anterior chamber intraocular lens; IOL intraocular lens; PCIOL posterior chamber intraocular lens; Phaco/IOL phacoemulsification with intraocular lens placement; [place] photorefractive keratectomy; LASIK laser assisted in situ keratomileusis; HTN hypertension; DM diabetes mellitus; COPD chronic obstructive pulmonary disease

## 2018-09-30 NOTE — Anesthesia Postprocedure Evaluation (Signed)
Anesthesia Post Note  Patient: Samuel [name]  Procedure(s) Performed: VITRECTOMY 25 GAUGE (Right Eye) Scleral Buckle (Right Eye) Gas/Fluid Exchange (Right Eye) Photocoagulation With Laser (Right Eye)     Patient location during evaluation: PACU Anesthesia Type: General Level of consciousness: sedated and patient cooperative Pain management: pain level controlled Vital Signs Assessment: post-procedure vital signs reviewed and stable Respiratory status: spontaneous breathing Cardiovascular status: stable Anesthetic complications: no    Last Vitals:  Vitals:   09/26/18 1604 09/26/18 1607  BP:  115/86  Pulse:  61  Resp:  (!) 22  Temp: (!) 36.3 C   SpO2:  92%    Last Pain:  Vitals:   09/26/18 1615  TempSrc:   PainSc: 0-No pain                 Nolon Nations

## 2018-10-01 NOTE — Progress Notes (Signed)
[facility] Note  10/03/2018     CHIEF COMPLAINT Patient presents for Post-op Follow-up   HISTORY OF PRESENT ILLNESS: Samuel [name] is a 68 y.o. male who presents to the clinic today for:   HPI    Post-op Follow-up    In right eye.  Discomfort includes Negative for pain, itching, foreign body sensation, tearing, discharge, floaters and none.  Vision is blurred at distance and is blurred at near.  I, the attending physician,  performed the HPI with the patient and updated documentation appropriately.          Comments    Patient states vision still blurred OD.        Last edited by Bernarda Caffey, MD on 10/03/2018  8:34 AM. (History)    pt states he has been maintaining face down at least 30 mins/hr  Referring physician: Eulas Post, MD [employer], [place] [address]  HISTORICAL INFORMATION:   Selected notes from the MEDICAL RECORD NUMBER Referred by Dr. Marica Otter for concern of RD OD LEE: 3.18.19 Ammie Ferrier)  Ocular Hx- PMH-    CURRENT MEDICATIONS: Current Outpatient Medications (Ophthalmic Drugs)  Medication Sig  . atropine 1 % ophthalmic solution Place 1 drop into the right eye 2 (two) times daily.  . bacitracin-polymyxin b (POLYSPORIN) ophthalmic ointment Place 1 application into the right eye 4 (four) times daily. apply to eye every 12 hours while awake  . dorzolamide-timolol (COSOPT) 22.3-6.8 MG/ML ophthalmic solution Place 1 drop into the right eye 2 (two) times daily.  . prednisoLONE acetate (PRED FORTE) 1 % ophthalmic suspension Place 1 drop into the right eye 6 (six) times daily.  Marland Kitchen ofloxacin (OCUFLOX) 0.3 % ophthalmic solution Place 1 drop into the right eye 4 (four) times daily for 11 days.   No current facility-administered medications for this visit.  (Ophthalmic Drugs)   Current Outpatient Medications (Other)  Medication Sig  . clobetasol (OLUX) 0.05 % topical foam   . hydrochlorothiazide  (HYDRODIURIL) 25 MG tablet TAKE 1 TABLET BY MOUTH DAILY  . HYDROcodone-acetaminophen (NORCO/VICODIN) 5-325 MG tablet Take 1 tablet by mouth every 4 (four) hours as needed for moderate pain.  Marland Kitchen lisinopril (PRINIVIL,ZESTRIL) 20 MG tablet TAKE 1 TABLET BY MOUTH DAILY  . metoprolol tartrate (LOPRESSOR) 50 MG tablet TAKE 1 TABLET BY MOUTH EVERY DAY  . sildenafil (VIAGRA) 100 MG tablet Take 0.5-1 tablets (50-100 mg total) by mouth daily as needed for erectile dysfunction.   No current facility-administered medications for this visit.  (Other)      REVIEW OF SYSTEMS: ROS    Positive for: Eyes   Negative for: Constitutional, Gastrointestinal, Neurological, Skin, Genitourinary, Musculoskeletal, HENT, Endocrine, Cardiovascular, Respiratory, Psychiatric, Allergic/Imm, Heme/Lymph   Last edited by Roselee Nova D on 10/03/2018  8:18 AM. (History)       ALLERGIES Allergies  Allergen Reactions  . Penicillins     REACTION: hives    PAST MEDICAL HISTORY Past Medical History:  Diagnosis Date  . ABNORMAL ELECTROCARDIOGRAM 12/21/2009  . EPISTAXIS, RECURRENT 12/21/2009  . ESSENTIAL HYPERTENSION 12/21/2009  . VENTRICULAR HYPERTROPHY, LEFT 01/04/2010   Past Surgical History:  Procedure Laterality Date  . EYE SURGERY  2009   catarac  . GAS/FLUID EXCHANGE Right 09/26/2018   Procedure: Gas/Fluid Exchange;  Surgeon: Bernarda Caffey, MD;  Location: [facility];  Service: Ophthalmology;  Laterality: Right;  . PHOTOCOAGULATION WITH LASER Right 09/26/2018   Procedure: Photocoagulation With Laser;  Surgeon: Bernarda Caffey,  MD;  Location: [facility];  Service: Ophthalmology;  Laterality: Right;  . SCLERAL BUCKLE Right 09/26/2018   Procedure: Scleral Buckle;  Surgeon: Bernarda Caffey, MD;  Location: [facility];  Service: Ophthalmology;  Laterality: Right;  . VITRECTOMY 25 GAUGE WITH SCLERAL BUCKLE Right 09/26/2018   Procedure: VITRECTOMY 25 GAUGE;  Surgeon: Bernarda Caffey, MD;  Location: [facility];  Service: Ophthalmology;  Laterality:  Right;    FAMILY HISTORY Family History  Problem Relation Age of Onset  . Cancer Mother        bladder CA  . Hypertension Mother   . Hypertension Father   . Heart disease Father   . Heart disease Maternal Grandfather   . Hypertension Maternal Grandfather   . Cancer Paternal Grandmother        ovarian CA  . Heart disease Paternal Grandfather   . Hypertension Paternal Grandfather     SOCIAL HISTORY Social History   Tobacco Use  . Smoking status: Light Tobacco Smoker    Packs/day: 1.00    Years: 18.00    Pack years: 18.00    Types: Cigars    Last attempt to quit: 08/22/1988    Years since quitting: 30.1  . Smokeless tobacco: Never Used  Substance Use Topics  . Alcohol use: Yes  . Drug use: Not on file         OPHTHALMIC EXAM:  Base Eye Exam    Visual Acuity (Snellen - Linear)      Right Left   Dist [place] CF@face  20/30 -2   Dist ph [place] NI 20/25 -2       Tonometry (Tonopen, 8:29 AM)      Right Left   Pressure 14        Pachymetry    09/27/2018       Pupils      Dark Light Shape React APD   Right 7 7 Round Minimal None   Left 4 3 Round Brisk None       Visual Fields (Counting fingers)      Left Right    Full   OD difficult to evaluate       Extraocular Movement      Right Left    Full, Ortho Full, Ortho       Neuro/Psych    Oriented x3:  Yes   Mood/Affect:  Normal       Dilation    Right eye:  1.0% Mydriacyl, 2.5% Phenylephrine @ 8:29 AM        Slit Lamp and Fundus Exam    Slit Lamp Exam      Right Left   Lids/Lashes Dermatochalasis - upper lid, Meibomian gland dysfunction Dermatochalasis - upper lid   Conjunctiva/Sclera sutures intact White and quiet   Cornea mild Arcus, 1+ Punctate epithelial erosions, 4x4DD inferior central epi defect, 3+ Descemet's folds mild Arcus, 1+ Punctate epithelial erosions   Anterior Chamber temporal AC is shallow with narrow temporal angle, otherwise deep; inferior IOL optic is posterior to dilated iris plane  Deep and quiet   Iris Round and dilated Round and dilated   Lens 3 piece sulcus IOL, open PC with condensations on posterior capsule; IOL optic back behind the iris plane, but pushed forward from gas,  3 piece IOL, open PC, ?sulcus   Vitreous post vitrectomy, good gas fill Vitreous syneresis, vitreous condensations       Fundus Exam      Right Left   Disc Pink and Sharp Pink and Sharp  C/D Ratio 0.5 0.5   Macula Flat and attached under gas Flat, Good foveal reflex, Retinal pigment epithelial mottling, trace Epiretinal membrane   Vessels Vascular attenuation, Tortuousity Vascular attenuation, Tortuous   Periphery retina attached over buckle, good buckle height, good laser 360; Original detachment: bullous inferior detachment from 0230-0930;  Attached, peripheral cystoid degeneration, no RT/RD on 360 scleral depression          IMAGING AND PROCEDURES  Imaging and Procedures for @TODAY @           ASSESSMENT/PLAN:    ICD-10-CM   1. 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 Retinal edema H35.81   3. Epiretinal membrane (ERM) of left eye H35.372   4. Pseudophakia of both eyes Z96.1     1,2. 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 [employer]: PWR: -0.50 BC: 8.4 DIA: 13.8) to remain until next appt             - IOP good at 14             - cont   PF 4x/day OD                         zymaxid QID OD                         Atropine BID OD-- stop when bottle runs out                         Cosopt BID  OD                         PSO ung QID OD             - cont face down positioning 30-50 min / hr; avoid laying flat on back             - eye shield when sleeping             - post op drop and positioning instructions reviewed             - tylenol/ibuprofen for pain             - Rx given for breakthrough pain  - f/u week of April 6  3. Epiretinal membrane, OS   - The natural history, anatomy, potential for loss of vision, and treatment options including vitrectomy techniques and the complications of endophthalmitis, retinal detachment, vitreous hemorrhage, cataract progression and permanent vision loss discussed with the patient.  - mild ERM  - asymptomatic, no metamorphopsia  - no indication for surgery at this time  - monitor for now  4. Pseudophakia OU  - s/p CE/IOL - Dr. Dolores Lory (2010)  - sulcus IOLs  - monitor   Ophthalmic Meds Ordered this visit:  Meds ordered this encounter  Medications  . ofloxacin (OCUFLOX) 0.3 %  ophthalmic solution    Sig: Place 1 drop into the right eye 4 (four) times daily for 11 days.    Dispense:  10 mL    Refill:  0       Return for f/u April 6 or 7 - overbook okay.  There are no Patient Instructions on file for this visit.   Explained the diagnoses, plan, and follow up with the patient and they expressed understanding.  Patient expressed understanding of the importance of proper follow up care.   This document serves as a record of services personally performed by Gardiner Sleeper, MD, PhD. It was created on their behalf by Ernest Mallick, OA, an ophthalmic assistant. The creation of this record is the provider's dictation and/or activities during the visit.    Electronically signed by: Ernest Mallick, OA  03.24.2020 10:42 AM   Gardiner Sleeper, M.D., Ph.D. Diseases & Surgery of the Retina and Vitreous [facility]  I have reviewed the above documentation for accuracy and completeness, and I agree with the above.  Gardiner Sleeper, M.D., Ph.D. 10/03/18 10:45 AM     Abbreviations: M myopia (nearsighted); A astigmatism; H hyperopia (farsighted); P presbyopia; Mrx spectacle prescription;  CTL contact lenses; OD right eye; OS left eye; OU both eyes  XT exotropia; ET esotropia; PEK punctate epithelial keratitis; PEE punctate epithelial erosions; DES dry eye syndrome; MGD meibomian gland dysfunction; ATs artificial tears; PFAT's preservative free artificial tears; [place] nuclear sclerotic cataract; PSC posterior subcapsular cataract; ERM epi-retinal membrane; PVD posterior vitreous detachment; RD retinal detachment; DM diabetes mellitus; DR diabetic retinopathy; NPDR non-proliferative diabetic retinopathy; PDR proliferative diabetic retinopathy; CSME clinically significant macular edema; DME diabetic macular edema; dbh dot blot hemorrhages; CWS cotton wool spot; POAG primary open angle glaucoma; C/D cup-to-disc ratio; HVF humphrey visual field; GVF goldmann visual field; OCT optical coherence tomography; IOP intraocular pressure; BRVO Branch retinal vein occlusion; CRVO central retinal vein occlusion; CRAO central retinal artery occlusion; BRAO branch retinal artery occlusion; RT retinal tear; SB scleral buckle; PPV pars plana vitrectomy; VH Vitreous hemorrhage; PRP panretinal laser photocoagulation; IVK intravitreal kenalog; VMT vitreomacular traction; MH Macular hole;  NVD neovascularization of the disc; NVE neovascularization elsewhere; AREDS age related eye disease study; ARMD age related macular degeneration; POAG primary open angle glaucoma; EBMD epithelial/anterior basement membrane dystrophy; ACIOL anterior chamber intraocular lens; IOL intraocular lens; PCIOL posterior chamber intraocular lens; Phaco/IOL phacoemulsification with intraocular lens placement; [place] photorefractive keratectomy; LASIK laser assisted in situ keratomileusis; HTN hypertension; DM diabetes mellitus; COPD chronic obstructive pulmonary disease

## 2018-10-02 ENCOUNTER — Telehealth: Payer: Self-pay

## 2018-10-02 ENCOUNTER — Telehealth: Payer: Self-pay | Admitting: Family Medicine

## 2018-10-02 NOTE — Telephone Encounter (Signed)
FYI

## 2018-10-02 NOTE — Telephone Encounter (Signed)
Pt returned call and lab message given to him regarding the results of the Cologuard test. Pt voiced understanding. He also wanted Dr. Elease Hashimoto to know that he had to have surgery for a detached retina last Thursday at the [facility]. He is doing well with that.

## 2018-10-02 NOTE — Telephone Encounter (Signed)
Called patient to give him Cologuard results per Dr. Elease Hashimoto that is Negative. Left a detailed voice message to call us back for test results.  OK for PEC to discuss results with patient.  CRM Created.

## 2018-10-03 ENCOUNTER — Ambulatory Visit (INDEPENDENT_AMBULATORY_CARE_PROVIDER_SITE_OTHER): Payer: Medicare Other | Admitting: Ophthalmology

## 2018-10-03 ENCOUNTER — Encounter (INDEPENDENT_AMBULATORY_CARE_PROVIDER_SITE_OTHER): Payer: Self-pay | Admitting: Ophthalmology

## 2018-10-03 ENCOUNTER — Other Ambulatory Visit: Payer: Self-pay

## 2018-10-03 DIAGNOSIS — H35372 Puckering of macula, left eye: Secondary | ICD-10-CM

## 2018-10-03 DIAGNOSIS — H3321 Serous retinal detachment, right eye: Secondary | ICD-10-CM

## 2018-10-03 DIAGNOSIS — Z961 Presence of intraocular lens: Secondary | ICD-10-CM

## 2018-10-03 DIAGNOSIS — H3581 Retinal edema: Secondary | ICD-10-CM

## 2018-10-03 MED ORDER — OFLOXACIN 0.3 % OP SOLN: 1.0000 [drp] | Freq: Four times a day (QID) | OPHTHALMIC | 0 refills | Status: AC

## 2018-10-08 NOTE — Progress Notes (Signed)
[facility] Note  10/14/2018     CHIEF COMPLAINT Patient presents for Retina Evaluation and Post-op Follow-up   HISTORY OF PRESENT ILLNESS: Samuel [name] is a 68 y.o. male who presents to the clinic today for:   HPI    Retina Evaluation    In right eye.  This started weeks ago.  Duration of weeks.  Context:  distance vision.  I, the attending physician,  performed the HPI with the patient and updated documentation appropriately.          Post-op Follow-up    In right eye.  Vision is stable.  I, the attending physician,  performed the HPI with the patient and updated documentation appropriately.          Comments    Patient states his vision is unchanged.  He denies eye pain.  He occasionally sees an "arc" in his vision OD.  Patient believes he is seeing part of gas bubble.  Patient denies any new or worsening floaters or flashes of light.       Last edited by Bernarda Caffey, MD on 10/14/2018 10:07 PM. (History)    pt states his hand and arms have been numb/tingling since the sx, he states his PCP dr thinks it's bc of the pressure he's been putting on his elbows due to the way he's having to position himself, pt states he was just recently told that his grandmother and uncle both had a RD, pt states before sx he had a sneezing episode where he sneezed 10-15 times in a row, pt state he is not feeling any pain,   Referring physician: Eulas Post, MD [place], [place] [address]  HISTORICAL INFORMATION:   Selected notes from the MEDICAL RECORD NUMBER Referred by Dr. Marica Otter for concern of RD OD LEE: 3.18.19 Ammie Ferrier)  Ocular Hx- PMH-    CURRENT MEDICATIONS: Current Outpatient Medications (Ophthalmic Drugs)  Medication Sig  . atropine 1 % ophthalmic solution Place 1 drop into the right eye 2 (two) times daily.  . bacitracin-polymyxin b (POLYSPORIN) ophthalmic ointment Place 1 application into the right eye 4 (four)  times daily. apply to eye every 12 hours while awake  . dorzolamide-timolol (COSOPT) 22.3-6.8 MG/ML ophthalmic solution Place 1 drop into the right eye 2 (two) times daily.  Marland Kitchen ofloxacin (OCUFLOX) 0.3 % ophthalmic solution Place 1 drop into the right eye 4 (four) times daily for 11 days.  . prednisoLONE acetate (PRED FORTE) 1 % ophthalmic suspension Place 1 drop into the right eye 6 (six) times daily.   No current facility-administered medications for this visit.  (Ophthalmic Drugs)   Current Outpatient Medications (Other)  Medication Sig  . clobetasol (OLUX) 0.05 % topical foam   . hydrochlorothiazide (HYDRODIURIL) 25 MG tablet TAKE 1 TABLET BY MOUTH DAILY  . HYDROcodone-acetaminophen (NORCO/VICODIN) 5-325 MG tablet Take 1 tablet by mouth every 4 (four) hours as needed for moderate pain.  Marland Kitchen lisinopril (PRINIVIL,ZESTRIL) 20 MG tablet TAKE 1 TABLET BY MOUTH DAILY  . metoprolol tartrate (LOPRESSOR) 50 MG tablet TAKE 1 TABLET BY MOUTH EVERY DAY  . sildenafil (VIAGRA) 100 MG tablet Take 0.5-1 tablets (50-100 mg total) by mouth daily as needed for erectile dysfunction.   No current facility-administered medications for this visit.  (Other)      REVIEW OF SYSTEMS: ROS    Positive for: Eyes   Negative for: Constitutional, Gastrointestinal, Neurological, Skin, Genitourinary, Musculoskeletal, HENT, Endocrine, Cardiovascular, Respiratory,  Psychiatric, Allergic/Imm, Heme/Lymph   Last edited by Doneen Poisson on 10/14/2018  2:40 PM. (History)       ALLERGIES Allergies  Allergen Reactions  . Penicillins     REACTION: hives    PAST MEDICAL HISTORY Past Medical History:  Diagnosis Date  . ABNORMAL ELECTROCARDIOGRAM 12/21/2009  . EPISTAXIS, RECURRENT 12/21/2009  . ESSENTIAL HYPERTENSION 12/21/2009  . VENTRICULAR HYPERTROPHY, LEFT 01/04/2010   Past Surgical History:  Procedure Laterality Date  . EYE SURGERY  2009   catarac  . GAS/FLUID EXCHANGE Right 09/26/2018   Procedure: Gas/Fluid  Exchange;  Surgeon: Bernarda Caffey, MD;  Location: [place];  Service: Ophthalmology;  Laterality: Right;  . PHOTOCOAGULATION WITH LASER Right 09/26/2018   Procedure: Photocoagulation With Laser;  Surgeon: Bernarda Caffey, MD;  Location: [place];  Service: Ophthalmology;  Laterality: Right;  . SCLERAL BUCKLE Right 09/26/2018   Procedure: Scleral Buckle;  Surgeon: Bernarda Caffey, MD;  Location: [place];  Service: Ophthalmology;  Laterality: Right;  . VITRECTOMY 25 GAUGE WITH SCLERAL BUCKLE Right 09/26/2018   Procedure: VITRECTOMY 25 GAUGE;  Surgeon: Bernarda Caffey, MD;  Location: [facility] [place];  Service: Ophthalmology;  Laterality: Right;    FAMILY HISTORY Family History  Problem Relation Age of Onset  . Cancer Mother        bladder CA  . Hypertension Mother   . Hypertension Father   . Heart disease Father   . Heart disease Maternal Grandfather   . Hypertension Maternal Grandfather   . Cancer Paternal Grandmother        ovarian CA  . Heart disease Paternal Grandfather   . Hypertension Paternal Grandfather     SOCIAL HISTORY Social History   Tobacco Use  . Smoking status: Light Tobacco Smoker    Packs/day: 1.00    Years: 18.00    Pack years: 18.00    Types: Cigars    Last attempt to quit: 08/22/1988    Years since quitting: 30.1  . Smokeless tobacco: Never Used  Substance Use Topics  . Alcohol use: Yes  . Drug use: Not on file         OPHTHALMIC EXAM:  Base Eye Exam    Visual Acuity (Snellen - Linear)      Right Left   Dist [place] 20/CF @ face 20/20 -3   Dist ph [place] 20/400        Tonometry (Tonopen, 2:37 PM)      Right Left   Pressure 16 19       Pupils      Dark Light Shape React APD   Right 8 8 Irregular Non reactive 0   Left 3 2 Round Brisk 0       Neuro/Psych    Oriented x3:  Yes   Mood/Affect:  Normal       Dilation    Both eyes:  2.5% Phenylephrine, 1.0% Mydriacyl @ 2:37 PM        Slit Lamp and Fundus Exam    Slit Lamp Exam      Right Left   Lids/Lashes  Dermatochalasis - upper lid Dermatochalasis - upper lid   Conjunctiva/Sclera [place] improved, sutures intact White and quiet   Cornea BCL in place, epi defect closed, 3-4+ PEE, 1+ Descemet's folds mild Arcus, 1+ Punctate epithelial erosions   Anterior Chamber temporal AC is shallow with narrow temporal angle, otherwise deep; inferior IOL optic is posterior to dilated iris plane -- mildly improved, 3+pigment Deep and quiet   Iris Round and  dilated Round and dilated   Lens 3 piece sulcus IOL, open PC; IOL optic back behind the iris plane, but pushed forward from gas,  3 piece IOL, open PC, ?sulcus   Vitreous post vitrectomy, gas bubble 80-85% Vitreous syneresis, vitreous condensations       Fundus Exam      Right Left   Disc Pink and Sharp Pink and Sharp   C/D Ratio 0.5 0.5   Macula Flat and attached under gas Flat, Good foveal reflex, Retinal pigment epithelial mottling, trace Epiretinal membrane   Vessels Vascular attenuation, Tortuousity Vascular attenuation, Tortuous   Periphery retina attached over buckle, good buckle height, good laser 360; Original detachment: bullous inferior detachment from 0230-0930;  Attached, peripheral cystoid degeneration, no RT/RD on 360 scleral depression        Refraction    Wearing Rx      Sphere   Right None   Left None          IMAGING AND PROCEDURES  Imaging and Procedures for _0 @           ASSESSMENT/PLAN:    ICD-10-CM   1. 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 Retinal edema H35.81   3. Epiretinal membrane (ERM) of left eye H35.372   4. Pseudophakia of both eyes Z96.1     1,2. Rhegmatogenous retinal detachment, RIGHT EYE  - bullous inferior mac off detachment, 0230-0930, fovea off  - onset of foveal involvement Wednesday, 03.18.20 by history  - POW3 s/p SBP + PPV/PFC/EL/FAX/14% C3F8 OD, 03.19.2020             - retina attached and in good position -- good buckle height and laser around breaks  - inferior optic displaced anterior  to iris--now posterior to dilated iris plane -- discussed importance of face down positioning  - 4x4DD inferior, central epi defect -- closed today under BCL  - BCL removed today without complication             - IOP good at 16  - gas bubble 80-85%  - stop  zymaxid QID OD -- okay to stop             - inc    PF 6x/day OD  - cont Cosopt BID OD                         PSO ung OD-- bedtime only             - cont face down positioning 30-50 min / hr; avoid laying flat on back             - can d/c eye shield when sleeping             - post op drop and positioning instructions reviewed  - f/u 3 weeks  3. Epiretinal membrane, OS   - The natural history, anatomy, potential for loss of vision, and treatment options including vitrectomy techniques and the complications of endophthalmitis, retinal detachment, vitreous hemorrhage, cataract progression and permanent vision loss discussed with the patient.  - mild ERM  - asymptomatic, no metamorphopsia  - no indication for surgery at this time  - monitor for now  4. Pseudophakia OU  - s/p CE/IOL - Dr. Dolores Lory (2010)  - sulcus IOLs  - monitor   Ophthalmic Meds Ordered this visit:  Meds ordered this encounter  Medications  . dorzolamide-timolol (COSOPT) 22.3-6.8 MG/ML ophthalmic solution    Sig: Place  1 drop into the right eye 2 (two) times daily.    Dispense:  10 mL    Refill:  3  . prednisoLONE acetate (PRED FORTE) 1 % ophthalmic suspension    Sig: Place 1 drop into the right eye 6 (six) times daily.    Dispense:  15 mL    Refill:  1       Return in about 3 weeks (around 11/04/2018) for f/u RD OD, DFE, OCT.  There are no Patient Instructions on file for this visit.   Explained the diagnoses, plan, and follow up with the patient and they expressed understanding.  Patient expressed understanding of the importance of proper follow up care.   This document serves as a record of services personally performed by Gardiner Sleeper,  MD, PhD. It was created on their behalf by Ernest Mallick, OA, an ophthalmic assistant. The creation of this record is the provider's dictation and/or activities during the visit.    Electronically signed by: Ernest Mallick, OA  03.31.2020 10:08 PM    Gardiner Sleeper, M.D., Ph.D. Diseases & Surgery of the Retina and Vitreous [facility]  I have reviewed the above documentation for accuracy and completeness, and I agree with the above. Gardiner Sleeper, M.D., Ph.D. 10/14/18 10:14 PM   Abbreviations: M myopia (nearsighted); A astigmatism; H hyperopia (farsighted); P presbyopia; Mrx spectacle prescription;  CTL contact lenses; OD right eye; OS left eye; OU both eyes  XT exotropia; ET esotropia; PEK punctate epithelial keratitis; PEE punctate epithelial erosions; DES dry eye syndrome; MGD meibomian gland dysfunction; ATs artificial tears; PFAT's preservative free artificial tears; [place] nuclear sclerotic cataract; PSC posterior subcapsular cataract; ERM epi-retinal membrane; PVD posterior vitreous detachment; RD retinal detachment; DM diabetes mellitus; DR diabetic retinopathy; NPDR non-proliferative diabetic retinopathy; PDR proliferative diabetic retinopathy; CSME clinically significant macular edema; DME diabetic macular edema; dbh dot blot hemorrhages; CWS cotton wool spot; POAG primary open angle glaucoma; C/D cup-to-disc ratio; HVF humphrey visual field; GVF goldmann visual field; OCT optical coherence tomography; IOP intraocular pressure; BRVO Branch retinal vein occlusion; CRVO central retinal vein occlusion; CRAO central retinal artery occlusion; BRAO branch retinal artery occlusion; RT retinal tear; SB scleral buckle; PPV pars plana vitrectomy; VH Vitreous hemorrhage; PRP panretinal laser photocoagulation; IVK intravitreal kenalog; VMT vitreomacular traction; MH Macular hole;  NVD neovascularization of the disc; NVE neovascularization elsewhere; AREDS age related eye disease study;  ARMD age related macular degeneration; POAG primary open angle glaucoma; EBMD epithelial/anterior basement membrane dystrophy; ACIOL anterior chamber intraocular lens; IOL intraocular lens; PCIOL posterior chamber intraocular lens; Phaco/IOL phacoemulsification with intraocular lens placement; [place] photorefractive keratectomy; LASIK laser assisted in situ keratomileusis; HTN hypertension; DM diabetes mellitus; COPD chronic obstructive pulmonary disease

## 2018-10-09 ENCOUNTER — Ambulatory Visit: Payer: Self-pay

## 2018-10-09 NOTE — Telephone Encounter (Signed)
Called patient. Left VM to return call to office.

## 2018-10-09 NOTE — Telephone Encounter (Signed)
Pt returned call.. he states that he has tingling in his right hand mainly in the ring and little finger.  He states that he noticed the symptoms worse after he had retina surgery.  He now has weakness to the fingers and had that make it difficult to squeeze his eye gtts into his eye. He states that early the 1st of march he started to have vision changes and went to his eye doctor he has some tingling at that time in his right ring and little finger. He states the fingers ar starting to feel tight as if swollen.  He has now noticed that the left hand ring and little finger are also feeling tingly and numb. Per protocol pt was asked to go to urgent care to be seen because office is closed. Pt refuses and instead is requesting appointment with Dr Elease Hashimoto. Note will be routed to office. Care advice read to patient. Pt verbalized understanding but still refuses Urgent care. Reason for Disposition . [1] Tingling (e.g., pins and needles) of the face, arm / hand, or leg / foot on one side of the body AND [2] present now  Answer Assessment - Initial Assessment Questions 1. SYMPTOM: "What is the main symptom you are concerned about?" (e.g., weakness, numbness)     Numbness and tingling in right hand ring and little finger 2. ONSET: "When did this start?" (minutes, hours, days; while sleeping)     before march19 then had IV in rt hand for surgery March 19 now starting in left hand 3. LAST NORMAL: "When was the last time you were normal (no symptoms)?"     March 1 4. PATTERN "Does this come and go, or has it been constant since it started?"  "Is it present now?"     continues 5. CARDIAC SYMPTOMS: "Have you had any of the following symptoms: chest pain, difficulty breathing, palpitations?"     no 6. NEUROLOGIC SYMPTOMS: "Have you had any of the following symptoms: headache, dizziness, vision loss, double vision, changes in speech, unsteady on your feet?"     Vision change from detached retena 7. OTHER  SYMPTOMS: "Do you have any other symptoms?"     lossing strength right hand can not squeeze eye gtt bottle 8. PREGNANCY: "Is there any chance you are pregnant?" "When was your last menstrual period?"    N/A  Protocols used: NEUROLOGIC DEFICIT-A-AH

## 2018-10-11 ENCOUNTER — Other Ambulatory Visit: Payer: Self-pay

## 2018-10-11 ENCOUNTER — Ambulatory Visit (INDEPENDENT_AMBULATORY_CARE_PROVIDER_SITE_OTHER): Payer: Medicare Other | Admitting: Family Medicine

## 2018-10-11 DIAGNOSIS — G5623 Lesion of ulnar nerve, bilateral upper limbs: Secondary | ICD-10-CM | POA: Diagnosis not present

## 2018-10-11 NOTE — Progress Notes (Signed)
Patient ID: Samuel [name], male   DOB: 1950/09/22, 68 y.o.   MRN: [id number]  Virtual Visit via Video Note  I connected with Samuel [name] on 10/11/18 at 11:15 AM EDT by a video enabled telemedicine application and verified that I am speaking with the correct person using two identifiers.  Location patient: home Location provider:work or home office Persons participating in the virtual visit: patient, provider  I discussed the limitations of evaluation and management by telemedicine and the availability of in person appointments. The patient expressed understanding and agreed to proceed.   HPI: Patient calls with some tingling sensation and numbness involving the right fifth finger predominantly and half of the fourth digit.  His recent history is that he had retinal detachment and had surgery on 19 March.  That required sleeping face down and also he has been spending significant time on a computer each day with both elbows on the desk and resting his head on his hands.  He started to have some mild symptoms on the left side as well.  He is right-hand dominant.  His major concern was with some weakness with gripping for the past couple days.  He has not had any pain.  No neck pain.  No pain at the elbow.  Tingling only involves the fifth digit and ulnar half of the fourth digit.  No prior history of ulnar neuropathy.  Past Medical History:  Diagnosis Date  . ABNORMAL ELECTROCARDIOGRAM 12/21/2009  . EPISTAXIS, RECURRENT 12/21/2009  . ESSENTIAL HYPERTENSION 12/21/2009  . VENTRICULAR HYPERTROPHY, LEFT 01/04/2010   Past Surgical History:  Procedure Laterality Date  . EYE SURGERY  2009   catarac  . GAS/FLUID EXCHANGE Right 09/26/2018   Procedure: Gas/Fluid Exchange;  Surgeon: Bernarda Caffey, MD;  Location: [facility];  Service: Ophthalmology;  Laterality: Right;  . PHOTOCOAGULATION WITH LASER Right 09/26/2018   Procedure: Photocoagulation With Laser;  Surgeon: Bernarda Caffey, MD;  Location: [facility];  Service:  Ophthalmology;  Laterality: Right;  . SCLERAL BUCKLE Right 09/26/2018   Procedure: Scleral Buckle;  Surgeon: Bernarda Caffey, MD;  Location: [place];  Service: Ophthalmology;  Laterality: Right;  . VITRECTOMY 25 GAUGE WITH SCLERAL BUCKLE Right 09/26/2018   Procedure: VITRECTOMY 25 GAUGE;  Surgeon: Bernarda Caffey, MD;  Location: [place] [place];  Service: Ophthalmology;  Laterality: Right;    reports that he has been smoking cigars. He has a 18.00 pack-year smoking history. He has never used smokeless tobacco. He reports current alcohol use. No history on file for drug. family history includes Cancer in his mother and paternal grandmother; Heart disease in his father, maternal grandfather, and paternal grandfather; Hypertension in his father, maternal grandfather, mother, and paternal grandfather. Allergies  Allergen Reactions  . Penicillins     REACTION: hives      ROS: See pertinent positives and negatives per HPI.    SOCIAL HX: Patient is married.  He works Surveyor, quantity.  Non-smoker.   Current Outpatient Medications:  .  atropine 1 % ophthalmic solution, Place 1 drop into the right eye 2 (two) times daily., Disp: , Rfl:  .  bacitracin-polymyxin b (POLYSPORIN) ophthalmic ointment, Place 1 application into the right eye 4 (four) times daily. apply to eye every 12 hours while awake, Disp: , Rfl:  .  clobetasol (OLUX) 0.05 % topical foam, , Disp: , Rfl:  .  dorzolamide-timolol (COSOPT) 22.3-6.8 MG/ML ophthalmic solution, Place 1 drop into the right eye 2 (two) times daily., Disp: , Rfl:  .  hydrochlorothiazide (HYDRODIURIL)  25 MG tablet, TAKE 1 TABLET BY MOUTH DAILY, Disp: 30 tablet, Rfl: 1 .  HYDROcodone-acetaminophen (NORCO/VICODIN) 5-325 MG tablet, Take 1 tablet by mouth every 4 (four) hours as needed for moderate pain., Disp: 20 tablet, Rfl: 0 .  lisinopril (PRINIVIL,ZESTRIL) 20 MG tablet, TAKE 1 TABLET BY MOUTH DAILY, Disp: 30 tablet, Rfl: 2 .  metoprolol tartrate (LOPRESSOR) 50 MG tablet, TAKE  1 TABLET BY MOUTH EVERY DAY, Disp: 90 tablet, Rfl: 1 .  ofloxacin (OCUFLOX) 0.3 % ophthalmic solution, Place 1 drop into the right eye 4 (four) times daily for 11 days., Disp: 10 mL, Rfl: 0 .  prednisoLONE acetate (PRED FORTE) 1 % ophthalmic suspension, Place 1 drop into the right eye 6 (six) times daily., Disp: , Rfl:  .  sildenafil (VIAGRA) 100 MG tablet, Take 0.5-1 tablets (50-100 mg total) by mouth daily as needed for erectile dysfunction., Disp: 10 tablet, Rfl: 6  EXAM:  VITALS per patient if applicable:  GENERAL: alert, oriented, appears well and in no acute distress  HEENT: atraumatic, conjunttiva clear, no obvious abnormalities on inspection of external nose and ears  NECK: normal movements of the head and neck  LUNGS: on inspection no signs of respiratory distress, breathing rate appears normal, no obvious gross SOB, gasping or wheezing  CV: no obvious cyanosis  MS: moves all visible extremities without noticeable abnormality  PSYCH/NEURO: pleasant and cooperative, no obvious depression or anxiety, speech and thought processing grossly intact  ASSESSMENT AND PLAN:  Discussed the following assessment and plan:  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  Based on his history, suspect this is related to increased pressure from changes above following his retinal detachment surgery.  He has been both sleeping in a position where he is putting pressure on the nerve as well as frequently several hours a day placing both elbows on the desk.  Doubt related to cervical disc pathology  -Recommended trying to keep pressure off the elbow as much as possible -Observe for now and if he has any progressive symptoms may need to consider nerve conduction velocities     I discussed the assessment and treatment plan with the patient. The patient was provided an opportunity to ask questions and all were answered. The patient agreed with the plan and demonstrated an understanding  of the instructions.   The patient was advised to call back or seek an in-person evaluation if the symptoms worsen or if the condition fails to improve as anticipated.  Carolann Littler, MD '

## 2018-10-14 ENCOUNTER — Encounter (INDEPENDENT_AMBULATORY_CARE_PROVIDER_SITE_OTHER): Payer: Self-pay | Admitting: Ophthalmology

## 2018-10-14 ENCOUNTER — Other Ambulatory Visit: Payer: Self-pay

## 2018-10-14 ENCOUNTER — Ambulatory Visit (INDEPENDENT_AMBULATORY_CARE_PROVIDER_SITE_OTHER): Payer: Medicare Other | Admitting: Ophthalmology

## 2018-10-14 DIAGNOSIS — Z961 Presence of intraocular lens: Secondary | ICD-10-CM

## 2018-10-14 DIAGNOSIS — H35372 Puckering of macula, left eye: Secondary | ICD-10-CM

## 2018-10-14 DIAGNOSIS — H3321 Serous retinal detachment, right eye: Secondary | ICD-10-CM

## 2018-10-14 DIAGNOSIS — H3581 Retinal edema: Secondary | ICD-10-CM

## 2018-10-14 MED ORDER — DORZOLAMIDE HCL-TIMOLOL MAL 2-0.5 % OP SOLN: 1.0000 [drp] | Freq: Two times a day (BID) | OPHTHALMIC | 3 refills | Status: DC

## 2018-10-14 MED ORDER — PREDNISOLONE ACETATE 1 % OP SUSP: 1.0000 [drp] | Freq: Every day | OPHTHALMIC | 1 refills | Status: DC

## 2018-11-01 NOTE — Progress Notes (Signed)
[facility] Note  11/04/2018     CHIEF COMPLAINT Patient presents for Post-op Follow-up   HISTORY OF PRESENT ILLNESS: Samuel [name] is a 68 y.o. male who presents to the clinic today for:   HPI    Post-op Follow-up    In right eye.  Discomfort includes Negative for pain, itching, foreign body sensation, tearing, discharge, floaters and none.  Vision is improved.  I, the attending physician,  performed the HPI with the patient and updated documentation appropriately.          Comments    Patient states vision improving OD. No eye pain/discomfort. Dark area in inferior vision OD when looking straight ahead.        Last edited by Bernarda Caffey, MD on 11/04/2018  3:47 PM. (History)    pt states the gas bubble is slowly going away  Referring physician: Eulas Post, MD [place], [place] [address]  HISTORICAL INFORMATION:   Selected notes from the MEDICAL RECORD NUMBER Referred by Dr. Marica Otter for concern of RD OD LEE: 3.18.19 Ammie Ferrier)  Ocular Hx- PMH-    CURRENT MEDICATIONS: Current Outpatient Medications (Ophthalmic Drugs)  Medication Sig  . bacitracin-polymyxin b (POLYSPORIN) ophthalmic ointment Place 1 application into the right eye at bedtime. apply to eye every 12 hours while awake   . dorzolamide-timolol (COSOPT) 22.3-6.8 MG/ML ophthalmic solution Place 1 drop into the right eye 2 (two) times daily.  . prednisoLONE acetate (PRED FORTE) 1 % ophthalmic suspension Place 1 drop into the right eye 6 (six) times daily.  Marland Kitchen atropine 1 % ophthalmic solution Place 1 drop into the right eye 2 (two) times daily.   No current facility-administered medications for this visit.  (Ophthalmic Drugs)   Current Outpatient Medications (Other)  Medication Sig  . clobetasol (OLUX) 0.05 % topical foam   . hydrochlorothiazide (HYDRODIURIL) 25 MG tablet TAKE 1 TABLET BY MOUTH DAILY  . HYDROcodone-acetaminophen (NORCO/VICODIN)  5-325 MG tablet Take 1 tablet by mouth every 4 (four) hours as needed for moderate pain.  Marland Kitchen lisinopril (PRINIVIL,ZESTRIL) 20 MG tablet TAKE 1 TABLET BY MOUTH DAILY  . metoprolol tartrate (LOPRESSOR) 50 MG tablet TAKE 1 TABLET BY MOUTH EVERY DAY  . sildenafil (VIAGRA) 100 MG tablet Take 0.5-1 tablets (50-100 mg total) by mouth daily as needed for erectile dysfunction.   No current facility-administered medications for this visit.  (Other)      REVIEW OF SYSTEMS: ROS    Positive for: Eyes   Negative for: Constitutional, Gastrointestinal, Neurological, Skin, Genitourinary, Musculoskeletal, HENT, Endocrine, Cardiovascular, Respiratory, Psychiatric, Allergic/Imm, Heme/Lymph   Last edited by Roselee Nova D on 11/04/2018  1:52 PM. (History)       ALLERGIES Allergies  Allergen Reactions  . Penicillins     REACTION: hives    PAST MEDICAL HISTORY Past Medical History:  Diagnosis Date  . ABNORMAL ELECTROCARDIOGRAM 12/21/2009  . EPISTAXIS, RECURRENT 12/21/2009  . ESSENTIAL HYPERTENSION 12/21/2009  . VENTRICULAR HYPERTROPHY, LEFT 01/04/2010   Past Surgical History:  Procedure Laterality Date  . EYE SURGERY  2009   catarac  . GAS/FLUID EXCHANGE Right 09/26/2018   Procedure: Gas/Fluid Exchange;  Surgeon: Bernarda Caffey, MD;  Location: [facility];  Service: Ophthalmology;  Laterality: Right;  . PHOTOCOAGULATION WITH LASER Right 09/26/2018   Procedure: Photocoagulation With Laser;  Surgeon: Bernarda Caffey, MD;  Location: [facility];  Service: Ophthalmology;  Laterality: Right;  . SCLERAL BUCKLE Right 09/26/2018   Procedure: Scleral  Buckle;  Surgeon: Bernarda Caffey, MD;  Location: [facility];  Service: Ophthalmology;  Laterality: Right;  . VITRECTOMY 25 GAUGE WITH SCLERAL BUCKLE Right 09/26/2018   Procedure: VITRECTOMY 25 GAUGE;  Surgeon: Bernarda Caffey, MD;  Location: [facility];  Service: Ophthalmology;  Laterality: Right;    FAMILY HISTORY Family History  Problem Relation Age of Onset  . Cancer Mother         bladder CA  . Hypertension Mother   . Hypertension Father   . Heart disease Father   . Heart disease Maternal Grandfather   . Hypertension Maternal Grandfather   . Cancer Paternal Grandmother        ovarian CA  . Heart disease Paternal Grandfather   . Hypertension Paternal Grandfather     SOCIAL HISTORY Social History   Tobacco Use  . Smoking status: Light Tobacco Smoker    Packs/day: 1.00    Years: 18.00    Pack years: 18.00    Types: Cigars    Last attempt to quit: 08/22/1988    Years since quitting: 30.2  . Smokeless tobacco: Never Used  Substance Use Topics  . Alcohol use: Yes  . Drug use: Not on file         OPHTHALMIC EXAM:  Base Eye Exam    Visual Acuity (Snellen - Linear)      Right Left   Dist [place] 20/150 -2 20/30 -1   Dist ph  20/40 20/25 +2       Tonometry (Tonopen, 2:04 PM)      Right Left   Pressure 13 19       Pachymetry (09/27/2018)      Right Left   Thickness 20        Pupils      Dark Light Shape React APD   Right 7 7 Round Minimal None   Left 3 2 Round Brisk None       Visual Fields (Counting fingers)      Left Right    Full    Restrictions  Partial outer inferior temporal deficiency       Extraocular Movement      Right Left    Full, Ortho Full, Ortho       Neuro/Psych    Oriented x3:  Yes   Mood/Affect:  Normal       Dilation    Both eyes:  1.0% Mydriacyl, 2.5% Phenylephrine @ 2:05 PM        Slit Lamp and Fundus Exam    Slit Lamp Exam      Right Left   Lids/Lashes Telangiectasia, Meibomian gland dysfunction Dermatochalasis - upper lid   Conjunctiva/Sclera Kell[place] vastly improved, sutures intact White and quiet   Cornea 3-4+ PEE mild Arcus, 1+ Punctate epithelial erosions   Anterior Chamber Deep and quiet Deep and quiet   Iris Round and dilated Round and dilated   Lens 3 piece sulcus IOL in good position, open PC 3 piece IOL, open PC, ?sulcus   Vitreous post vitrectomy, gas bubble 35-40% Vitreous syneresis, vitreous  condensations       Fundus Exam      Right Left   Disc Pink and Sharp Pink and Sharp   C/D Ratio 0.5 0.5   Macula Flat, Blunted foveal reflex, re-attached Flat, Good foveal reflex, Retinal pigment epithelial mottling, trace Epiretinal membrane   Vessels Vascular attenuation, Tortuousity Vascular attenuation, Tortuous   Periphery retina attached over buckle, good buckle height, good laser over buckle; Original detachment:  bullous inferior detachment from 0230-0930;  Attached, peripheral cystoid degeneration, no RT/RD on 360 scleral depression          IMAGING AND PROCEDURES  Imaging and Procedures for _0 @  OCT, Retina - OU - Both Eyes       Right Eye Quality was good. Central Foveal Thickness: 299. Progression has no prior data. Findings include normal foveal contour, no SRF, no IRF, epiretinal membrane, outer retinal atrophy (Patchy ORA).   Left Eye Quality was good. Central Foveal Thickness: 312. Progression has no prior data. Findings include normal foveal contour, no SRF, no IRF (Trace ERM).   Notes *Images captured and stored on drive  Diagnosis / Impression:  OD: NFP, no IRF/SRF, patchy ORA OS: NFP, no IRF/SRF   Clinical management:  See below  Abbreviations: NFP - Normal foveal profile. CME - cystoid macular edema. PED - pigment epithelial detachment. IRF - intraretinal fluid. SRF - subretinal fluid. EZ - ellipsoid zone. ERM - epiretinal membrane. ORA - outer retinal atrophy. ORT - outer retinal tubulation. SRHM - subretinal hyper-reflective material                 ASSESSMENT/PLAN:    ICD-10-CM   1. 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 Retinal edema H35.81 OCT, Retina - OU - Both Eyes  3. Epiretinal membrane (ERM) of left eye H35.372   4. Pseudophakia of both eyes Z96.1     1,2. Rhegmatogenous retinal detachment, RIGHT EYE  - bullous inferior mac off detachment, 0230-0930, fovea off  - onset of foveal involvement Wednesday, 03.18.20 by  history  - POW5 s/p SBP + PPV/PFC/EL/FAX/14% C3F8 OD, 03.19.2020             - retina attached and in good position -- good buckle height and laser around breaks  - inferior optic displaced anterior to iris--now posterior to dilated iris plane -- discussed importance of face down positioning  - BCVA: 20/40             - IOP good at 19  - gas bubble 35-40%             - dec    PF 4x/day OD  - dec Cosopt Qdaily OD                         PSO ung OD-- at bedtime  - add    Artificial tears QID OD             - can d/c face down positioning; avoid laying flat on backand looking up             - post op drop and positioning instructions reviewed  - f/u 4 weeks  3. Epiretinal membrane, OS   - The natural history, anatomy, potential for loss of vision, and treatment options including vitrectomy techniques and the complications of endophthalmitis, retinal detachment, vitreous hemorrhage, cataract progression and permanent vision loss discussed with the patient.  - mild ERM  - asymptomatic, no metamorphopsia  - no indication for surgery at this time  - monitor for now  4. Pseudophakia OU  - s/p CE/IOL - Dr. Dolores Lory (2010)  - sulcus IOLs  - monitor    Ophthalmic Meds Ordered this visit:  No orders of the defined types were placed in this encounter.      Return in about 4 weeks (around 12/02/2018) for f/u RD OD, DFE, OCT.  There are no Patient Instructions on  file for this visit.   Explained the diagnoses, plan, and follow up with the patient and they expressed understanding.  Patient expressed understanding of the importance of proper follow up care.   This document serves as a record of services personally performed by Gardiner Sleeper, MD, PhD. It was created on their behalf by Ernest Mallick, OA, an ophthalmic assistant. The creation of this record is the provider's dictation and/or activities during the visit.    Electronically signed by: Ernest Mallick, OA  04.24.2020 12:13 AM     Gardiner Sleeper, M.D., Ph.D. Diseases & Surgery of the Retina and Vitreous [facility]  I have reviewed the above documentation for accuracy and completeness, and I agree with the above. Gardiner Sleeper, M.D., Ph.D. 11/05/18 12:15 AM    Abbreviations: M myopia (nearsighted); A astigmatism; H hyperopia (farsighted); P presbyopia; Mrx spectacle prescription;  CTL contact lenses; OD right eye; OS left eye; OU both eyes  XT exotropia; ET esotropia; PEK punctate epithelial keratitis; PEE punctate epithelial erosions; DES dry eye syndrome; MGD meibomian gland dysfunction; ATs artificial tears; PFAT's preservative free artificial tears; [place] nuclear sclerotic cataract; PSC posterior subcapsular cataract; ERM epi-retinal membrane; PVD posterior vitreous detachment; RD retinal detachment; DM diabetes mellitus; DR diabetic retinopathy; NPDR non-proliferative diabetic retinopathy; PDR proliferative diabetic retinopathy; CSME clinically significant macular edema; DME diabetic macular edema; dbh dot blot hemorrhages; CWS cotton wool spot; POAG primary open angle glaucoma; C/D cup-to-disc ratio; HVF humphrey visual field; GVF goldmann visual field; OCT optical coherence tomography; IOP intraocular pressure; BRVO Branch retinal vein occlusion; CRVO central retinal vein occlusion; CRAO central retinal artery occlusion; BRAO branch retinal artery occlusion; RT retinal tear; SB scleral buckle; PPV pars plana vitrectomy; VH Vitreous hemorrhage; PRP panretinal laser photocoagulation; IVK intravitreal kenalog; VMT vitreomacular traction; MH Macular hole;  NVD neovascularization of the disc; NVE neovascularization elsewhere; AREDS age related eye disease study; ARMD age related macular degeneration; POAG primary open angle glaucoma; EBMD epithelial/anterior basement membrane dystrophy; ACIOL anterior chamber intraocular lens; IOL intraocular lens; PCIOL posterior chamber intraocular lens; Phaco/IOL  phacoemulsification with intraocular lens placement; [place] photorefractive keratectomy; LASIK laser assisted in situ keratomileusis; HTN hypertension; DM diabetes mellitus; COPD chronic obstructive pulmonary disease

## 2018-11-04 ENCOUNTER — Other Ambulatory Visit: Payer: Self-pay

## 2018-11-04 ENCOUNTER — Ambulatory Visit (INDEPENDENT_AMBULATORY_CARE_PROVIDER_SITE_OTHER): Payer: Medicare Other | Admitting: Ophthalmology

## 2018-11-04 ENCOUNTER — Encounter (INDEPENDENT_AMBULATORY_CARE_PROVIDER_SITE_OTHER): Payer: Self-pay | Admitting: Ophthalmology

## 2018-11-04 DIAGNOSIS — H3581 Retinal edema: Secondary | ICD-10-CM | POA: Diagnosis not present

## 2018-11-04 DIAGNOSIS — Z961 Presence of intraocular lens: Secondary | ICD-10-CM

## 2018-11-04 DIAGNOSIS — H3321 Serous retinal detachment, right eye: Secondary | ICD-10-CM | POA: Diagnosis not present

## 2018-11-04 DIAGNOSIS — H35372 Puckering of macula, left eye: Secondary | ICD-10-CM | POA: Diagnosis not present

## 2018-11-05 ENCOUNTER — Encounter (INDEPENDENT_AMBULATORY_CARE_PROVIDER_SITE_OTHER): Payer: Self-pay | Admitting: Ophthalmology

## 2018-11-21 ENCOUNTER — Other Ambulatory Visit: Payer: Self-pay | Admitting: Family Medicine

## 2018-11-22 ENCOUNTER — Other Ambulatory Visit: Payer: Self-pay | Admitting: Family Medicine

## 2018-12-02 NOTE — Progress Notes (Addendum)
[facility] Note  12/03/2018     CHIEF COMPLAINT Patient presents for Post-op Follow-up   HISTORY OF PRESENT ILLNESS: Samuel [name] is a 68 y.o. male who presents to the clinic today for:   HPI    Post-op Follow-up    In right eye.  Discomfort includes Negative for pain, itching, foreign body sensation, tearing, discharge, floaters and none.  Vision is improved.  I, the attending physician,  performed the HPI with the patient and updated documentation appropriately.          Comments    4 weeks s/p RD repair OD--Patient states vision improving OD. Small remnants of bubble in vision OD. No eye pain. Using PF qid, cosopt once daily, polysporin ointment at night, and artificial tears as needed OD.        Last edited by Bernarda Caffey, MD on 12/03/2018  8:34 AM. (History)    pt states the gas bubble is very small now, pt states he is back at work  Referring physician: Eulas Post, MD [place], [place] [address]  HISTORICAL INFORMATION:   Selected notes from the [facility] Referred by Dr. Marica Otter for concern of RD OD LEE: 3.18.19 Ammie Ferrier)  Ocular Hx- PMH-    CURRENT MEDICATIONS: Current Outpatient Medications (Ophthalmic Drugs)  Medication Sig  . atropine 1 % ophthalmic solution Place 1 drop into the right eye 2 (two) times daily.  . bacitracin-polymyxin b (POLYSPORIN) ophthalmic ointment Place 1 application into the right eye at bedtime. apply to eye every 12 hours while awake   . dorzolamide-timolol (COSOPT) 22.3-6.8 MG/ML ophthalmic solution Place 1 drop into the right eye 2 (two) times daily. (Patient taking differently: Place 1 drop into the right eye daily. )  . prednisoLONE acetate (PRED FORTE) 1 % ophthalmic suspension Place 1 drop into the right eye 6 (six) times daily.   No current facility-administered medications for this visit.  (Ophthalmic Drugs)   Current Outpatient Medications (Other)   Medication Sig  . clobetasol (OLUX) 0.05 % topical foam   . hydrochlorothiazide (HYDRODIURIL) 25 MG tablet TAKE 1 TABLET BY MOUTH DAILY  . HYDROcodone-acetaminophen (NORCO/VICODIN) 5-325 MG tablet Take 1 tablet by mouth every 4 (four) hours as needed for moderate pain.  Marland Kitchen lisinopril (PRINIVIL,ZESTRIL) 20 MG tablet TAKE 1 TABLET BY MOUTH DAILY  . metoprolol tartrate (LOPRESSOR) 50 MG tablet TAKE 1 TABLET BY MOUTH EVERY DAY  . sildenafil (VIAGRA) 100 MG tablet Take 0.5-1 tablets (50-100 mg total) by mouth daily as needed for erectile dysfunction.   No current facility-administered medications for this visit.  (Other)      REVIEW OF SYSTEMS: ROS    Positive for: Eyes   Negative for: Constitutional, Gastrointestinal, Neurological, Skin, Genitourinary, Musculoskeletal, HENT, Endocrine, Cardiovascular, Respiratory, Psychiatric, Allergic/Imm, Heme/Lymph   Last edited by Roselee Nova D on 12/03/2018  8:10 AM. (History)       ALLERGIES Allergies  Allergen Reactions  . Penicillins     REACTION: hives    PAST MEDICAL HISTORY Past Medical History:  Diagnosis Date  . ABNORMAL ELECTROCARDIOGRAM 12/21/2009  . EPISTAXIS, RECURRENT 12/21/2009  . ESSENTIAL HYPERTENSION 12/21/2009  . VENTRICULAR HYPERTROPHY, LEFT 01/04/2010   Past Surgical History:  Procedure Laterality Date  . EYE SURGERY  2009   catarac  . GAS/FLUID EXCHANGE Right 09/26/2018   Procedure: Gas/Fluid Exchange;  Surgeon: Bernarda Caffey, MD;  Location: [facility];  Service: Ophthalmology;  Laterality: Right;  .  PHOTOCOAGULATION WITH LASER Right 09/26/2018   Procedure: Photocoagulation With Laser;  Surgeon: Bernarda Caffey, MD;  Location: [facility];  Service: Ophthalmology;  Laterality: Right;  . SCLERAL BUCKLE Right 09/26/2018   Procedure: Scleral Buckle;  Surgeon: Bernarda Caffey, MD;  Location: [facility];  Service: Ophthalmology;  Laterality: Right;  . VITRECTOMY 25 GAUGE WITH SCLERAL BUCKLE Right 09/26/2018   Procedure: VITRECTOMY 25 GAUGE;   Surgeon: Bernarda Caffey, MD;  Location: [facility];  Service: Ophthalmology;  Laterality: Right;    FAMILY HISTORY Family History  Problem Relation Age of Onset  . Cancer Mother        bladder CA  . Hypertension Mother   . Hypertension Father   . Heart disease Father   . Heart disease Maternal Grandfather   . Hypertension Maternal Grandfather   . Cancer Paternal Grandmother        ovarian CA  . Heart disease Paternal Grandfather   . Hypertension Paternal Grandfather     SOCIAL HISTORY Social History   Tobacco Use  . Smoking status: Light Tobacco Smoker    Packs/day: 1.00    Years: 18.00    Pack years: 18.00    Types: Cigars    Last attempt to quit: 08/22/1988    Years since quitting: 30.3  . Smokeless tobacco: Never Used  Substance Use Topics  . Alcohol use: Yes  . Drug use: Not on file         OPHTHALMIC EXAM:  Base Eye Exam    Visual Acuity (Snellen - Linear)      Right Left   Dist [place] 20/200 -2 20/20 -2   Dist ph [place] 20/50 +2        Tonometry (Tonopen, 8:24 AM)      Right Left   Pressure 15 20       Pachymetry (09/27/2018)      Right Left   Thickness 20        Pupils      Dark Light Shape React APD   Right 7 7 Round Minimal None   Left 4 3 Round Slow None       Visual Fields (Counting fingers)      Left Right    Full Full  No VF defect noted today OD       Extraocular Movement      Right Left    Full, Ortho Full, Ortho       Neuro/Psych    Oriented x3:  Yes   Mood/Affect:  Normal       Dilation    Both eyes:  1.0% Mydriacyl, 2.5% Phenylephrine @ 8:24 AM        Slit Lamp and Fundus Exam    Slit Lamp Exam      Right Left   Lids/Lashes Dermatochalasis - upper lid Dermatochalasis - upper lid   Conjunctiva/Sclera White and quiet, sutures dissolved White and quiet   Cornea 3+ PEE mild Arcus, 1+ Punctate epithelial erosions   Anterior Chamber Deep and quiet Deep and quiet   Iris Round and dilated Round and dilated   Lens 3 piece sulcus  IOL in good position, open PC 3 piece IOL, open PC, ?sulcus   Vitreous post vitrectomy, gas bubble >1% Vitreous syneresis, vitreous condensations       Fundus Exam      Right Left   Disc Pink and Sharp Pink and Sharp   C/D Ratio 0.3 0.5   Macula Flat, good foveal reflex, focal, nasal ERM,  no heme Flat, Good foveal reflex, Retinal pigment epithelial mottling, trace Epiretinal membrane   Vessels Vascular attenuation, Tortuousity Vascular attenuation, Tortuous   Periphery retina attached over buckle, good buckle height, good laser over buckle; Original detachment: bullous inferior detachment from 0230-0930;  Attached, peripheral cystoid degeneration, no RT/RD         Refraction    Manifest Refraction      Sphere Cylinder Axis Dist VA   Right -4.25 +1.00 085 20/30-2   Left -0.50 Sphere  20/20-2          IMAGING AND PROCEDURES  Imaging and Procedures for _0 @  OCT, Retina - OU - Both Eyes       Right Eye Quality was good. Central Foveal Thickness: 298. Progression has improved. Findings include normal foveal contour, no SRF, no IRF, epiretinal membrane, outer retinal atrophy (Retinal re-attached, macula re-attached, good foveal depression, trace ellipsoid thinning).   Left Eye Quality was good. Central Foveal Thickness: 308. Progression has been stable. Findings include normal foveal contour, no SRF, no IRF (Trace ERM).   Notes *Images captured and stored on drive  Diagnosis / Impression:  OD: NFP, no IRF/SRF, Retina re-attached, macula re-attached, good foveal depression, trace ellipsoid thinning OS: NFP, no IRF/SRF   Clinical management:  See below  Abbreviations: NFP - Normal foveal profile. CME - cystoid macular edema. PED - pigment epithelial detachment. IRF - intraretinal fluid. SRF - subretinal fluid. EZ - ellipsoid zone. ERM - epiretinal membrane. ORA - outer retinal atrophy. ORT - outer retinal tubulation. SRHM - subretinal hyper-reflective material                  ASSESSMENT/PLAN:    ICD-10-CM   1. 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 Retinal edema H35.81 OCT, Retina - OU - Both Eyes  3. Epiretinal membrane (ERM) of left eye H35.372   4. Pseudophakia of both eyes Z96.1     1,2. Rhegmatogenous retinal detachment, RIGHT EYE  - bullous inferior mac off detachment, 0230-0930, fovea off  - onset of foveal involvement Wednesday, 03.18.20 by history  - POW10 s/p SBP + PPV/PFC/EL/FAX/14% C3F8 OD, 03.19.2020             - retina attached and in good position -- good buckle height and laser around breaks  - inferior optic displaced anterior to iris after surgery--now posterior to iris plane and in good position  - BCVA: 20/30-2 (refraction) -- some mild corneal / dry eye limiting vision             - IOP good at 15  - gas bubble ~1%              - start PF taper -- 4,3,2,1 drops daily, decrease weekly  - cont Cosopt Qdaily OD                         PSO ung PRN OD  - cont Artificial tears QID PRN OD             - post op instructions reviewed  - clear to proceed with MRx w/ Dr. Sabra Heck  - f/u 6-8 weeks  3. Epiretinal membrane, OS   - mild ERM  - asymptomatic, no metamorphopsia  - no indication for surgery at this time  - monitor for now  4. Pseudophakia OU  - s/p CE/IOL - Dr. Dolores Lory (2010)  - sulcus IOLs  - monitor   Ophthalmic Meds Ordered this  visit:  No orders of the defined types were placed in this encounter.      Return for 6-8 wks, POV RD OD.  There are no Patient Instructions on file for this visit.   Explained the diagnoses, plan, and follow up with the patient and they expressed understanding.  Patient expressed understanding of the importance of proper follow up care.   This document serves as a record of services personally performed by Gardiner Sleeper, MD, PhD. It was created on their behalf by Ernest Mallick, OA, an ophthalmic assistant. The creation of this record is the provider's dictation and/or  activities during the visit.    Electronically signed by: Ernest Mallick, OA  05.25.2020 12:21 PM    Gardiner Sleeper, M.D., Ph.D. Diseases & Surgery of the Retina and Vitreous [facility]  I have reviewed the above documentation for accuracy and completeness, and I agree with the above. Gardiner Sleeper, M.D., Ph.D. 12/03/18 12:24 PM     Abbreviations: M myopia (nearsighted); A astigmatism; H hyperopia (farsighted); P presbyopia; Mrx spectacle prescription;  CTL contact lenses; OD right eye; OS left eye; OU both eyes  XT exotropia; ET esotropia; PEK punctate epithelial keratitis; PEE punctate epithelial erosions; DES dry eye syndrome; MGD meibomian gland dysfunction; ATs artificial tears; PFAT's preservative free artificial tears; [place] nuclear sclerotic cataract; PSC posterior subcapsular cataract; ERM epi-retinal membrane; PVD posterior vitreous detachment; RD retinal detachment; DM diabetes mellitus; DR diabetic retinopathy; NPDR non-proliferative diabetic retinopathy; PDR proliferative diabetic retinopathy; CSME clinically significant macular edema; DME diabetic macular edema; dbh dot blot hemorrhages; CWS cotton wool spot; POAG primary open angle glaucoma; C/D cup-to-disc ratio; HVF humphrey visual field; GVF goldmann visual field; OCT optical coherence tomography; IOP intraocular pressure; BRVO Branch retinal vein occlusion; CRVO central retinal vein occlusion; CRAO central retinal artery occlusion; BRAO branch retinal artery occlusion; RT retinal tear; SB scleral buckle; PPV pars plana vitrectomy; VH Vitreous hemorrhage; PRP panretinal laser photocoagulation; IVK intravitreal kenalog; VMT vitreomacular traction; MH Macular hole;  NVD neovascularization of the disc; NVE neovascularization elsewhere; AREDS age related eye disease study; ARMD age related macular degeneration; POAG primary open angle glaucoma; EBMD epithelial/anterior basement membrane dystrophy; ACIOL anterior  chamber intraocular lens; IOL intraocular lens; PCIOL posterior chamber intraocular lens; Phaco/IOL phacoemulsification with intraocular lens placement; [place] photorefractive keratectomy; LASIK laser assisted in situ keratomileusis; HTN hypertension; DM diabetes mellitus; COPD chronic obstructive pulmonary disease

## 2018-12-03 ENCOUNTER — Ambulatory Visit (INDEPENDENT_AMBULATORY_CARE_PROVIDER_SITE_OTHER): Payer: Medicare Other | Admitting: Ophthalmology

## 2018-12-03 ENCOUNTER — Other Ambulatory Visit: Payer: Self-pay

## 2018-12-03 ENCOUNTER — Encounter (INDEPENDENT_AMBULATORY_CARE_PROVIDER_SITE_OTHER): Payer: Self-pay | Admitting: Ophthalmology

## 2018-12-03 DIAGNOSIS — H3321 Serous retinal detachment, right eye: Secondary | ICD-10-CM

## 2018-12-03 DIAGNOSIS — H3581 Retinal edema: Secondary | ICD-10-CM

## 2018-12-03 DIAGNOSIS — H35372 Puckering of macula, left eye: Secondary | ICD-10-CM

## 2018-12-03 DIAGNOSIS — Z961 Presence of intraocular lens: Secondary | ICD-10-CM | POA: Diagnosis not present

## 2018-12-08 ENCOUNTER — Other Ambulatory Visit: Payer: Self-pay | Admitting: Family Medicine

## 2019-01-13 NOTE — Progress Notes (Signed)
[facility]  01/14/2019     CHIEF COMPLAINT Patient presents for Retina Evaluation   HISTORY OF PRESENT ILLNESS: Samuel [name] is a 68 y.o. male who presents to the clinic today for:   HPI    Retina Evaluation    In right eye.  This started weeks ago.  Duration of weeks.  Context:  distance vision.  Response to treatment was mild improvement.  I, the attending physician,  performed the HPI with the patient and updated documentation appropriately.          Comments    RD OD s/p SBP+PPV 09/26/2018.  Patient states his vision is improving in his right eye since surgery.  Patient denies eye pain or discomfort.  Patient still has floaters OS but denies any new or worsening floaters/fol in either eye.       Last edited by Bernarda Caffey, MD on 01/14/2019  8:44 AM. (History)    pt states he can tell his vision is improving, he states it's not as good as before surgery, but he doesn't really have any problems with it, he states he has not gone to get a new glasses rx yet, but plans to after this visit, pt states he is seeing floaters in his left eye, but they are not new  Referring physician: Eulas Post, MD [place],  [place] [address]  HISTORICAL INFORMATION:   Selected notes from the MEDICAL RECORD NUMBER Referred by Dr. Marica Otter for concern of RD OD LEE: 3.18.19 Ammie Ferrier)  Ocular Hx- PMH-    CURRENT MEDICATIONS: Current Outpatient Medications (Ophthalmic Drugs)  Medication Sig  . atropine 1 % ophthalmic solution Place 1 drop into the right eye 2 (two) times daily.  . bacitracin-polymyxin b (POLYSPORIN) ophthalmic ointment Place 1 application into the right eye at bedtime. apply to eye every 12 hours while awake   . dorzolamide-timolol (COSOPT) 22.3-6.8 MG/ML ophthalmic solution Place 1 drop into the right eye 2 (two) times daily. (Patient taking differently: Place 1 drop into the right eye daily. )  . prednisoLONE  acetate (PRED FORTE) 1 % ophthalmic suspension Place 1 drop into the right eye 6 (six) times daily.   No current facility-administered medications for this visit.  (Ophthalmic Drugs)   Current Outpatient Medications (Other)  Medication Sig  . clobetasol (OLUX) 0.05 % topical foam   . hydrochlorothiazide (HYDRODIURIL) 25 MG tablet TAKE 1 TABLET BY MOUTH DAILY  . HYDROcodone-acetaminophen (NORCO/VICODIN) 5-325 MG tablet Take 1 tablet by mouth every 4 (four) hours as needed for moderate pain.  Marland Kitchen lisinopril (ZESTRIL) 20 MG tablet TAKE 1 TABLET BY MOUTH DAILY  . metoprolol tartrate (LOPRESSOR) 50 MG tablet TAKE 1 TABLET BY MOUTH EVERY DAY  . sildenafil (VIAGRA) 100 MG tablet Take 0.5-1 tablets (50-100 mg total) by mouth daily as needed for erectile dysfunction.   No current facility-administered medications for this visit.  (Other)      REVIEW OF SYSTEMS: ROS    Positive for: Eyes   Negative for: Constitutional, Gastrointestinal, Neurological, Skin, Genitourinary, Musculoskeletal, HENT, Endocrine, Cardiovascular, Respiratory, Psychiatric, Allergic/Imm, Heme/Lymph   Last edited by Doneen Poisson on 01/14/2019  8:40 AM. (History)       ALLERGIES Allergies  Allergen Reactions  . Penicillins     REACTION: hives    PAST MEDICAL HISTORY Past Medical History:  Diagnosis Date  . ABNORMAL ELECTROCARDIOGRAM 12/21/2009  . EPISTAXIS, RECURRENT 12/21/2009  . ESSENTIAL HYPERTENSION  12/21/2009  . VENTRICULAR HYPERTROPHY, LEFT 01/04/2010   Past Surgical History:  Procedure Laterality Date  . EYE SURGERY  2009   catarac  . GAS/FLUID EXCHANGE Right 09/26/2018   Procedure: Gas/Fluid Exchange;  Surgeon: Bernarda Caffey, MD;  Location: [facility];  Service: Ophthalmology;  Laterality: Right;  . PHOTOCOAGULATION WITH LASER Right 09/26/2018   Procedure: Photocoagulation With Laser;  Surgeon: Bernarda Caffey, MD;  Location: [facility];  Service: Ophthalmology;  Laterality: Right;  . SCLERAL BUCKLE Right 09/26/2018    Procedure: Scleral Buckle;  Surgeon: Bernarda Caffey, MD;  Location: [facility];  Service: Ophthalmology;  Laterality: Right;  . VITRECTOMY 25 GAUGE WITH SCLERAL BUCKLE Right 09/26/2018   Procedure: VITRECTOMY 25 GAUGE;  Surgeon: Bernarda Caffey, MD;  Location: [facility];  Service: Ophthalmology;  Laterality: Right;    FAMILY HISTORY Family History  Problem Relation Age of Onset  . Cancer Mother        bladder CA  . Hypertension Mother   . Hypertension Father   . Heart disease Father   . Heart disease Maternal Grandfather   . Hypertension Maternal Grandfather   . Cancer Paternal Grandmother        ovarian CA  . Heart disease Paternal Grandfather   . Hypertension Paternal Grandfather     SOCIAL HISTORY Social History   Tobacco Use  . Smoking status: Light Tobacco Smoker    Packs/day: 1.00    Years: 18.00    Pack years: 18.00    Types: Cigars    Last attempt to quit: 08/22/1988    Years since quitting: 30.4  . Smokeless tobacco: Never Used  Substance Use Topics  . Alcohol use: Yes  . Drug use: Not on file         OPHTHALMIC EXAM:  Base Eye Exam    Visual Acuity (Snellen - Linear)      Right Left   Dist [place] 20/80 -2 20/25 -1   Dist ph [place] 20/40 -1 20/20 -1       Tonometry (Tonopen, 8:38 AM)      Right Left   Pressure 18 26       Tonometry #2 (Tonopen, 9:32 AM)      Right Left   Pressure 15 19       Pupils      Dark Light Shape React APD   Right 5 5 Round Minimal 0   Left 3 2 Round Brisk 0       Extraocular Movement      Right Left    Full Full       Neuro/Psych    Oriented x3: Yes   Mood/Affect: Normal       Dilation    Both eyes: 1.0% Mydriacyl, 2.5% Phenylephrine @ 8:38 AM        Slit Lamp and Fundus Exam    Slit Lamp Exam      Right Left   Lids/Lashes Dermatochalasis - upper lid Dermatochalasis - upper lid   Conjunctiva/Sclera White and quiet White and quiet   Cornea 1+ PEE mild Arcus, 1+ Punctate epithelial erosions   Anterior Chamber Deep and  quiet Deep and quiet   Iris Round and dilated Round and dilated   Lens 3 piece sulcus IOL in good position, open PC 3 piece IOL, open PC, ?sulcus   Vitreous post vitrectomy, gas bubble gone Vitreous syneresis, vitreous condensations, Posterior vitreous detachment       Fundus Exam      Right Left  Disc Pink and Sharp Pink and Sharp   C/D Ratio 0.3 0.5   Macula Flat, blunted foveal reflex, RPE mottling, focal, nasal ERM, no heme Flat, Good foveal reflex, Retinal pigment epithelial mottling, trace Epiretinal membrane   Vessels Vascular attenuation, mild Tortuousity Vascular attenuation, mild Tortuousity   Periphery retina attached over buckle, good buckle height, good laser over buckle; Original detachment: bullous inferior detachment from 0230-0930 Attached, peripheral cystoid degeneration, no RT/RD         Refraction    Wearing Rx      Sphere   Right None   Left None          IMAGING AND PROCEDURES  Imaging and Procedures for _0 @  OCT, Retina - OU - Both Eyes       Right Eye Quality was good. Central Foveal Thickness: 301. Progression has been stable. Findings include normal foveal contour, no SRF, no IRF, epiretinal membrane, outer retinal atrophy (Retinal re-attached, macula re-attached, good foveal depression, trace ellipsoid thinning).   Left Eye Quality was good. Central Foveal Thickness: 301. Progression has been stable. Findings include normal foveal contour, no SRF, no IRF (Trace ERM).   Notes *Images captured and stored on drive  Diagnosis / Impression:  OD: NFP, no IRF/SRF, Retina re-attached, macula re-attached, good foveal depression, trace ellipsoid thinning OS: NFP, no IRF/SRF   Clinical management:  See below  Abbreviations: NFP - Normal foveal profile. CME - cystoid macular edema. PED - pigment epithelial detachment. IRF - intraretinal fluid. SRF - subretinal fluid. EZ - ellipsoid zone. ERM - epiretinal membrane. ORA - outer retinal atrophy. ORT  - outer retinal tubulation. SRHM - subretinal hyper-reflective material                 ASSESSMENT/PLAN:    ICD-10-CM   1. Right retinal detachment  H33.21   2. Retinal edema  H35.81 OCT, Retina - OU - Both Eyes  3. Epiretinal membrane (ERM) of left eye  H35.372   4. Pseudophakia of both eyes  Z96.1     1,2. Rhegmatogenous retinal detachment, RIGHT EYE  - bullous inferior mac off detachment, 0230-0930, fovea off  - onset of foveal involvement Wednesday, 03.18.20 by history  - POM4 s/p SBP + PPV/PFC/EL/FAX/14% C3F8 OD, 03.19.2020             - retina attached and in good position -- good buckle height and laser around breaks  - BCVA: 20/40-1 (pin hole) -- corneal / dry eye improved today             - IOP good at 18  - gas bubble now gone             - completed PF taper   - cont Cosopt Qdaily OD             - PSO ung PRN OD  - cont Artificial tears QID PRN OD  - clear to proceed with MRx w/ Dr. Sabra Heck  - f/u 3-4 months  3. Epiretinal membrane, OS   - mild ERM  - asymptomatic, no metamorphopsia  - no indication for surgery at this time  - monitor for now  4. Pseudophakia OU  - s/p CE/IOL - Dr. Dolores Lory (2010)  - sulcus IOLs  - monitor   Ophthalmic Meds Ordered this visit:  No orders of the defined types were placed in this encounter.      Return for 3-4 mos, RD OD, DFE, OCT.  There are no Patient  Instructions on file for this visit.   Explained the diagnoses, plan, and follow up with the patient and they expressed understanding.  Patient expressed understanding of the importance of proper follow up care.   This document serves as a record of services personally performed by Gardiner Sleeper, MD, PhD. It was created on their behalf by Ernest Mallick, OA, an ophthalmic assistant. The creation of this record is the provider's dictation and/or activities during the visit.    Electronically signed by: Ernest Mallick, OA  07.06.2020 12:34 PM    Gardiner Sleeper, M.D.,  Ph.D. Diseases & Surgery of the Retina and Vitreous [facility]   I have reviewed the above documentation for accuracy and completeness, and I agree with the above. Gardiner Sleeper, M.D., Ph.D. 01/14/19 12:35 PM    Abbreviations: M myopia (nearsighted); A astigmatism; H hyperopia (farsighted); P presbyopia; Mrx spectacle prescription;  CTL contact lenses; OD right eye; OS left eye; OU both eyes  XT exotropia; ET esotropia; PEK punctate epithelial keratitis; PEE punctate epithelial erosions; DES dry eye syndrome; MGD meibomian gland dysfunction; ATs artificial tears; PFAT's preservative free artificial tears; [place] nuclear sclerotic cataract; PSC posterior subcapsular cataract; ERM epi-retinal membrane; PVD posterior vitreous detachment; RD retinal detachment; DM diabetes mellitus; DR diabetic retinopathy; NPDR non-proliferative diabetic retinopathy; PDR proliferative diabetic retinopathy; CSME clinically significant macular edema; DME diabetic macular edema; dbh dot blot hemorrhages; CWS cotton wool spot; POAG primary open angle glaucoma; C/D cup-to-disc ratio; HVF humphrey visual field; GVF goldmann visual field; OCT optical coherence tomography; IOP intraocular pressure; BRVO Branch retinal vein occlusion; CRVO central retinal vein occlusion; CRAO central retinal artery occlusion; BRAO branch retinal artery occlusion; RT retinal tear; SB scleral buckle; PPV pars plana vitrectomy; VH Vitreous hemorrhage; PRP panretinal laser photocoagulation; IVK intravitreal kenalog; VMT vitreomacular traction; MH Macular hole;  NVD neovascularization of the disc; NVE neovascularization elsewhere; AREDS age related eye disease study; ARMD age related macular degeneration; POAG primary open angle glaucoma; EBMD epithelial/anterior basement membrane dystrophy; ACIOL anterior chamber intraocular lens; IOL intraocular lens; PCIOL posterior chamber intraocular lens; Phaco/IOL phacoemulsification with  intraocular lens placement; [place] photorefractive keratectomy; LASIK laser assisted in situ keratomileusis; HTN hypertension; DM diabetes mellitus; COPD chronic obstructive pulmonary disease

## 2019-01-14 ENCOUNTER — Other Ambulatory Visit: Payer: Self-pay

## 2019-01-14 ENCOUNTER — Encounter (INDEPENDENT_AMBULATORY_CARE_PROVIDER_SITE_OTHER): Payer: Self-pay | Admitting: Ophthalmology

## 2019-01-14 ENCOUNTER — Ambulatory Visit (INDEPENDENT_AMBULATORY_CARE_PROVIDER_SITE_OTHER): Payer: Medicare Other | Admitting: Ophthalmology

## 2019-01-14 DIAGNOSIS — H35372 Puckering of macula, left eye: Secondary | ICD-10-CM | POA: Diagnosis not present

## 2019-01-14 DIAGNOSIS — H3581 Retinal edema: Secondary | ICD-10-CM | POA: Diagnosis not present

## 2019-01-14 DIAGNOSIS — Z961 Presence of intraocular lens: Secondary | ICD-10-CM | POA: Diagnosis not present

## 2019-01-14 DIAGNOSIS — H3321 Serous retinal detachment, right eye: Secondary | ICD-10-CM

## 2019-02-05 ENCOUNTER — Other Ambulatory Visit: Payer: Self-pay | Admitting: Family Medicine

## 2019-04-02 ENCOUNTER — Other Ambulatory Visit: Payer: Self-pay | Admitting: Family Medicine

## 2019-04-04 NOTE — Progress Notes (Signed)
[facility]  04/07/2019     CHIEF COMPLAINT Patient presents for Post-op Follow-up   HISTORY OF PRESENT ILLNESS: Samuel [name] is a 68 y.o. male who presents to the clinic today for:   HPI    Post-op Follow-up    In right eye.  Discomfort includes none.  Vision is improved.  I, the attending physician,  performed the HPI with the patient and updated documentation appropriately.          Comments    68 y/o male pt here for 2.5 mo f/u.  S/p PPV + SBP 03.19.20.  No noticed change in [place] OU.  Denies pain, flashes, floaters. Cosopt QD OD.       Last edited by Bernarda Caffey, MD on 04/07/2019  4:42 PM. (History)    pt states he has gotten a contact lens for his right eye   Referring physician: Eulas Post, MD [place],  [place] [address]  HISTORICAL INFORMATION:   Selected notes from the MEDICAL RECORD NUMBER Referred by Dr. Marica Otter for concern of RD OD LEE: 3.18.20 Ammie Ferrier)  Ocular Hx- PMH-    CURRENT MEDICATIONS: Current Outpatient Medications (Ophthalmic Drugs)  Medication Sig  . dorzolamide-timolol (COSOPT) 22.3-6.8 MG/ML ophthalmic solution Place 1 drop into both eyes daily.  Marland Kitchen atropine 1 % ophthalmic solution Place 1 drop into the right eye 2 (two) times daily.  . bacitracin-polymyxin b (POLYSPORIN) ophthalmic ointment Place 1 application into the right eye at bedtime. apply to eye every 12 hours while awake   . prednisoLONE acetate (PRED FORTE) 1 % ophthalmic suspension Place 1 drop into the right eye 6 (six) times daily. (Patient not taking: Reported on 04/07/2019)   No current facility-administered medications for this visit.  (Ophthalmic Drugs)   Current Outpatient Medications (Other)  Medication Sig  . clobetasol (OLUX) 0.05 % topical foam   . hydrochlorothiazide (HYDRODIURIL) 25 MG tablet TAKE 1 TABLET BY MOUTH DAILY  . HYDROcodone-acetaminophen (NORCO/VICODIN) 5-325 MG tablet Take 1 tablet by  mouth every 4 (four) hours as needed for moderate pain.  Marland Kitchen lisinopril (ZESTRIL) 20 MG tablet TAKE 1 TABLET BY MOUTH DAILY  . metoprolol tartrate (LOPRESSOR) 50 MG tablet TAKE 1 TABLET BY MOUTH EVERY DAY  . sildenafil (VIAGRA) 100 MG tablet Take 0.5-1 tablets (50-100 mg total) by mouth daily as needed for erectile dysfunction.   No current facility-administered medications for this visit.  (Other)      REVIEW OF SYSTEMS: ROS    Positive for: Eyes   Negative for: Constitutional, Gastrointestinal, Neurological, Skin, Genitourinary, Musculoskeletal, HENT, Endocrine, Cardiovascular, Respiratory, Psychiatric, Allergic/Imm, Heme/Lymph   Last edited by Matthew Folks, COA on 04/07/2019  9:03 AM. (History)       ALLERGIES Allergies  Allergen Reactions  . Penicillins     REACTION: hives    PAST MEDICAL HISTORY Past Medical History:  Diagnosis Date  . ABNORMAL ELECTROCARDIOGRAM 12/21/2009  . EPISTAXIS, RECURRENT 12/21/2009  . ESSENTIAL HYPERTENSION 12/21/2009  . Retinal detachment    OD  . VENTRICULAR HYPERTROPHY, LEFT 01/04/2010   Past Surgical History:  Procedure Laterality Date  . CATARACT EXTRACTION Bilateral 2009  . EYE SURGERY Bilateral 2009   cataract  . GAS/FLUID EXCHANGE Right 09/26/2018   Procedure: Gas/Fluid Exchange;  Surgeon: Bernarda Caffey, MD;  Location: [facility];  Service: Ophthalmology;  Laterality: Right;  . PHOTOCOAGULATION WITH LASER Right 09/26/2018   Procedure: Photocoagulation With Laser;  Surgeon: Coralyn Pear,  Aaron Edelman, MD;  Location: [facility];  Service: Ophthalmology;  Laterality: Right;  . SCLERAL BUCKLE Right 09/26/2018   Procedure: Scleral Buckle;  Surgeon: Bernarda Caffey, MD;  Location: [facility];  Service: Ophthalmology;  Laterality: Right;  . VITRECTOMY 25 GAUGE WITH SCLERAL BUCKLE Right 09/26/2018   Procedure: VITRECTOMY 25 GAUGE;  Surgeon: Bernarda Caffey, MD;  Location: [facility];  Service: Ophthalmology;  Laterality: Right;    FAMILY HISTORY Family History  Problem  Relation Age of Onset  . Cancer Mother        bladder CA  . Hypertension Mother   . Hypertension Father   . Heart disease Father   . Heart disease Maternal Grandfather   . Hypertension Maternal Grandfather   . Cancer Paternal Grandmother        ovarian CA  . Heart disease Paternal Grandfather   . Hypertension Paternal Grandfather     SOCIAL HISTORY Social History   Tobacco Use  . Smoking status: Light Tobacco Smoker    Packs/day: 1.00    Years: 18.00    Pack years: 18.00    Types: Cigars    Last attempt to quit: 08/22/1988    Years since quitting: 30.6  . Smokeless tobacco: Never Used  Substance Use Topics  . Alcohol use: Yes  . Drug use: Not on file         OPHTHALMIC EXAM:  Base Eye Exam    Visual Acuity (Snellen - Linear)      Right Left   Dist [place]  20/25   Dist [place] 20/30    Dist ph [place]  20/25 +2   Dist ph cc 20/20 -2    Correction: Contacts  Only wears cl OD       Tonometry (Tonopen, 9:07 AM)      Right Left   Pressure 17 22       Tonometry #2 (Tonopen, 9:46 AM)      Right Left   Pressure 13 20       Pupils      Dark Light Shape React APD   Right 4 3 Round Minimal None   Left 4 3 Round Minimal None       Visual Fields (Counting fingers)      Left Right    Full Full       Extraocular Movement      Right Left    Full, Ortho Full, Ortho       Neuro/Psych    Oriented x3: Yes   Mood/Affect: Normal       Dilation    Both eyes: 1.0% Mydriacyl, 2.5% Phenylephrine @ 9:07 AM        Slit Lamp and Fundus Exam    Slit Lamp Exam      Right Left   Lids/Lashes Dermatochalasis - upper lid Dermatochalasis - upper lid   Conjunctiva/Sclera White and quiet White and quiet   Cornea 2-3+ PEE, inferior, paracentral corneal haze mild Arcus, 1+ Punctate epithelial erosions   Anterior Chamber Deep and quiet Deep and quiet   Iris Round and dilated Round and dilated   Lens 3 piece sulcus IOL in good position, open PC 3 piece IOL, open PC, ?sulcus    Vitreous post vitrectomy, gas bubble gone Vitreous syneresis, vitreous condensations, Posterior vitreous detachment       Fundus Exam      Right Left   Disc Pink and Sharp Pink and Sharp, mild tilt   C/D Ratio 0.4 0.5   Macula  Flat, blunted foveal reflex, RPE mottling, focal, nasal ERM, no heme Flat, Good foveal reflex, Retinal pigment epithelial mottling, trace Epiretinal membrane   Vessels Vascular attenuation, mild Tortuousity Vascular attenuation, mild Tortuousity   Periphery retina attached over buckle, good buckle height, good laser over buckle; Original detachment: bullous inferior detachment from 0230-0930 Attached, peripheral cystoid degeneration, no RT/RD         Refraction    Manifest Refraction      Sphere Cylinder Axis Dist [place]   Right -4.25 +1.00 085 20/25+2   Left -0.50 Sphere  20/20-2          IMAGING AND PROCEDURES  Imaging and Procedures for _0 @  OCT, Retina - OU - Both Eyes       Right Eye Quality was good. Central Foveal Thickness: 311. Progression has been stable. Findings include normal foveal contour, no SRF, no IRF, epiretinal membrane, outer retinal atrophy (Retinal re-attached, macula re-attached, good foveal depression, trace ellipsoid thinning - improving, focal ERM nasal macula).   Left Eye Quality was good. Central Foveal Thickness: 313. Progression has been stable. Findings include normal foveal contour, no SRF, no IRF (Trace ERM).   Notes *Images captured and stored on drive  Diagnosis / Impression:  OD: NFP, no IRF/SRF, Retina re-attached, macula re-attached, good foveal depression, trace ellipsoid thinning - improving OS: NFP, no IRF/SRF   Clinical management:  See below  Abbreviations: NFP - Normal foveal profile. CME - cystoid macular edema. PED - pigment epithelial detachment. IRF - intraretinal fluid. SRF - subretinal fluid. EZ - ellipsoid zone. ERM - epiretinal membrane. ORA - outer retinal atrophy. ORT - outer retinal  tubulation. SRHM - subretinal hyper-reflective material                 ASSESSMENT/PLAN:    ICD-10-CM   1. Right retinal detachment  H33.21   2. Retinal edema  H35.81 OCT, Retina - OU - Both Eyes  3. Epiretinal membrane (ERM) of left eye  H35.372   4. 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 Rhegmatogenous retinal detachment, RIGHT EYE  - bullous inferior mac off detachment, 0230-0930, fovea off  - onset of foveal involvement Wednesday, 03.18.20 by history  - POM6 s/p SBP + PPV/PFC/EL/FAX/14% C3F8 OD, 03.19.2020             - retina attached and in good position -- good buckle height and laser around breaks  - BCVA: 20/20-2 (pin hole) -- corneal / dry eye improved today             - IOP good at 17  - cont Cosopt Qdaily OU  - cont Artificial tears QID PRN OD  - f/u 9 months  3. Epiretinal membrane, OS   - mild ERM  - asymptomatic, no metamorphopsia  - no indication for surgery at this time  - monitor for now  4. Pseudophakia OU  - s/p CE/IOL - Dr. Dolores Lory (2010)  - sulcus IOLs  - monitor  5. Ocular hypertension OU  - IOP OD 13 and stably good on Cosopt qdaily  - OS IOP has been borderline -- recommend using cosopt daily in this eye too     Ophthalmic Meds Ordered this visit:  Meds ordered this encounter  Medications  . dorzolamide-timolol (COSOPT) 22.3-6.8 MG/ML ophthalmic solution    Sig: Place 1 drop into both eyes daily.    Dispense:  10 mL  Refill:  9       Return in about 9 months (around 01/05/2020) for f/u RD OD, DFE, OCT.  There are no Patient Instructions on file for this visit.   Explained the diagnoses, plan, and follow up with the patient and they expressed understanding.  Patient expressed understanding of the importance of proper follow up care.    This document serves as a record of services personally performed by Gardiner Sleeper, MD, PhD. It was created on their behalf by Roselee Nova,  COMT. The creation of this record is the provider's dictation and/or activities during the visit.  Electronically signed by: Roselee Nova, COMT 04/07/19 4:45 PM   Gardiner Sleeper, M.D., Ph.D. Diseases & Surgery of the Retina and Vitreous [facility] 04/07/19  I have reviewed the above documentation for accuracy and completeness, and I agree with the above. Gardiner Sleeper, M.D., Ph.D. 04/07/19 4:45 PM     Abbreviations: M myopia (nearsighted); A astigmatism; H hyperopia (farsighted); P presbyopia; Mrx spectacle prescription;  CTL contact lenses; OD right eye; OS left eye; OU both eyes  XT exotropia; ET esotropia; PEK punctate epithelial keratitis; PEE punctate epithelial erosions; DES dry eye syndrome; MGD meibomian gland dysfunction; ATs artificial tears; PFAT's preservative free artificial tears; [place] nuclear sclerotic cataract; PSC posterior subcapsular cataract; ERM epi-retinal membrane; PVD posterior vitreous detachment; RD retinal detachment; DM diabetes mellitus; DR diabetic retinopathy; NPDR non-proliferative diabetic retinopathy; PDR proliferative diabetic retinopathy; CSME clinically significant macular edema; DME diabetic macular edema; dbh dot blot hemorrhages; CWS cotton wool spot; POAG primary open angle glaucoma; C/D cup-to-disc ratio; HVF humphrey visual field; GVF goldmann visual field; OCT optical coherence tomography; IOP intraocular pressure; BRVO Branch retinal vein occlusion; CRVO central retinal vein occlusion; CRAO central retinal artery occlusion; BRAO branch retinal artery occlusion; RT retinal tear; SB scleral buckle; PPV pars plana vitrectomy; VH Vitreous hemorrhage; PRP panretinal laser photocoagulation; IVK intravitreal kenalog; VMT vitreomacular traction; MH Macular hole;  NVD neovascularization of the disc; NVE neovascularization elsewhere; AREDS age related eye disease study; ARMD age related macular degeneration; POAG primary open angle  glaucoma; EBMD epithelial/anterior basement membrane dystrophy; ACIOL anterior chamber intraocular lens; IOL intraocular lens; PCIOL posterior chamber intraocular lens; Phaco/IOL phacoemulsification with intraocular lens placement; [place] photorefractive keratectomy; LASIK laser assisted in situ keratomileusis; HTN hypertension; DM diabetes mellitus; COPD chronic obstructive pulmonary disease

## 2019-04-07 ENCOUNTER — Encounter (INDEPENDENT_AMBULATORY_CARE_PROVIDER_SITE_OTHER): Payer: Self-pay | Admitting: Ophthalmology

## 2019-04-07 ENCOUNTER — Ambulatory Visit (INDEPENDENT_AMBULATORY_CARE_PROVIDER_SITE_OTHER): Payer: Medicare Other | Admitting: Ophthalmology

## 2019-04-07 ENCOUNTER — Other Ambulatory Visit: Payer: Self-pay

## 2019-04-07 DIAGNOSIS — Z961 Presence of intraocular lens: Secondary | ICD-10-CM | POA: Diagnosis not present

## 2019-04-07 DIAGNOSIS — H40053 Ocular hypertension, bilateral: Secondary | ICD-10-CM

## 2019-04-07 DIAGNOSIS — H3581 Retinal edema: Secondary | ICD-10-CM

## 2019-04-07 DIAGNOSIS — H3321 Serous retinal detachment, right eye: Secondary | ICD-10-CM | POA: Diagnosis not present

## 2019-04-07 DIAGNOSIS — H35372 Puckering of macula, left eye: Secondary | ICD-10-CM

## 2019-04-07 MED ORDER — DORZOLAMIDE HCL-TIMOLOL MAL 2-0.5 % OP SOLN: 1.0000 [drp] | Freq: Every day | OPHTHALMIC | 9 refills | Status: DC

## 2019-05-20 ENCOUNTER — Other Ambulatory Visit: Payer: Self-pay | Admitting: Family Medicine

## 2019-05-23 ENCOUNTER — Other Ambulatory Visit: Payer: Self-pay | Admitting: Family Medicine

## 2019-06-21 ENCOUNTER — Other Ambulatory Visit: Payer: Self-pay | Admitting: Family Medicine

## 2019-07-01 ENCOUNTER — Other Ambulatory Visit: Payer: Self-pay | Admitting: Family Medicine

## 2019-07-17 DIAGNOSIS — C4491 Basal cell carcinoma of skin, unspecified: Secondary | ICD-10-CM

## 2019-07-17 DIAGNOSIS — C44619 Basal cell carcinoma of skin of left upper limb, including shoulder: Secondary | ICD-10-CM | POA: Diagnosis not present

## 2019-07-17 DIAGNOSIS — L570 Actinic keratosis: Secondary | ICD-10-CM | POA: Diagnosis not present

## 2019-07-17 HISTORY — DX: Basal cell carcinoma of skin, unspecified: C44.91

## 2019-08-02 ENCOUNTER — Other Ambulatory Visit: Payer: Self-pay | Admitting: Family Medicine

## 2019-08-04 ENCOUNTER — Other Ambulatory Visit: Payer: Self-pay | Admitting: Family Medicine

## 2019-08-24 ENCOUNTER — Other Ambulatory Visit: Payer: Self-pay | Admitting: Family Medicine

## 2019-08-25 ENCOUNTER — Other Ambulatory Visit: Payer: Self-pay | Admitting: Family Medicine

## 2019-08-28 DIAGNOSIS — L820 Inflamed seborrheic keratosis: Secondary | ICD-10-CM | POA: Diagnosis not present

## 2019-08-28 DIAGNOSIS — L570 Actinic keratosis: Secondary | ICD-10-CM | POA: Diagnosis not present

## 2019-08-28 DIAGNOSIS — C44619 Basal cell carcinoma of skin of left upper limb, including shoulder: Secondary | ICD-10-CM | POA: Diagnosis not present

## 2019-08-28 DIAGNOSIS — D485 Neoplasm of uncertain behavior of skin: Secondary | ICD-10-CM | POA: Diagnosis not present

## 2019-09-02 ENCOUNTER — Other Ambulatory Visit: Payer: Self-pay | Admitting: Family Medicine

## 2019-09-24 ENCOUNTER — Other Ambulatory Visit: Payer: Self-pay | Admitting: Family Medicine

## 2019-09-27 ENCOUNTER — Other Ambulatory Visit: Payer: Self-pay | Admitting: Family Medicine

## 2019-09-30 ENCOUNTER — Other Ambulatory Visit: Payer: Self-pay | Admitting: Family Medicine

## 2019-10-23 ENCOUNTER — Other Ambulatory Visit: Payer: Self-pay | Admitting: Family Medicine

## 2019-10-28 ENCOUNTER — Other Ambulatory Visit: Payer: Self-pay | Admitting: Family Medicine

## 2019-11-03 ENCOUNTER — Other Ambulatory Visit: Payer: Self-pay

## 2019-11-04 ENCOUNTER — Encounter: Payer: Self-pay | Admitting: Family Medicine

## 2019-11-04 ENCOUNTER — Ambulatory Visit (INDEPENDENT_AMBULATORY_CARE_PROVIDER_SITE_OTHER): Payer: Medicare Other | Admitting: Family Medicine

## 2019-11-04 VITALS — BP 120/62 | HR 65 | Temp 97.6°F | Wt 271.9 lb

## 2019-11-04 DIAGNOSIS — G5621 Lesion of ulnar nerve, right upper limb: Secondary | ICD-10-CM

## 2019-11-04 DIAGNOSIS — I10 Essential (primary) hypertension: Secondary | ICD-10-CM | POA: Diagnosis not present

## 2019-11-04 DIAGNOSIS — R7303 Prediabetes: Secondary | ICD-10-CM

## 2019-11-04 LAB — BASIC METABOLIC PANEL
BUN: 23 mg/dL (ref 6–23)
CO2: 29 mEq/L (ref 19–32)
Calcium: 9.8 mg/dL (ref 8.4–10.5)
Chloride: 100 mEq/L (ref 96–112)
Creatinine, Ser: 1.18 mg/dL (ref 0.40–1.50)
GFR: 61.61 mL/min (ref >60.00)
Glucose, Bld: 141 mg/dL — ABNORMAL HIGH (ref 70–99)
Potassium: 4.1 mEq/L (ref 3.5–5.1)
Sodium: 137 mEq/L (ref 135–145)

## 2019-11-04 LAB — HEMOGLOBIN A1C: Hgb A1c MFr Bld: 5.9 % (ref 4.6–6.5)

## 2019-11-04 MED ORDER — HYDROCHLOROTHIAZIDE 25 MG PO TABS: 25.0000 mg | ORAL_TABLET | Freq: Every day | ORAL | 3 refills | Status: DC

## 2019-11-04 NOTE — Progress Notes (Signed)
Subjective:     Patient ID: Samuel [name], male   DOB: 1950-07-16, 69 y.o.   MRN: [id number]  HPI  Samuel [name] is seen for medical follow-up.  He had retinal detachment a year ago and has recovered from that.  When he was in recovery he frequently placed elbows in a position on desk where he was likely compressing his ulnar nerve.  He still has some intermittent tingling right ulnar nerve distribution.  No weakness.  No significant pain.  Denies any neck pain.  He does not feel this is severe enough to warrant any kind of further evaluation or intervention at this time  Hypertension which is treated with 3 drug regimen of HCTZ, lisinopril, and metoprolol.  He is not monitoring blood pressure.  No recent headaches or dizziness.  No chest pains.  No consistent exercise.  His weight is essentially unchanged from last year  History of prediabetes.  No family history of type 2 diabetes in first-degree relatives.  No polyuria or polydipsia.  Last A1c 6.0%.  Past Medical History:  Diagnosis Date  . ABNORMAL ELECTROCARDIOGRAM 12/21/2009  . EPISTAXIS, RECURRENT 12/21/2009  . ESSENTIAL HYPERTENSION 12/21/2009  . Retinal detachment    OD  . VENTRICULAR HYPERTROPHY, LEFT 01/04/2010   Past Surgical History:  Procedure Laterality Date  . CATARACT EXTRACTION Bilateral 2009  . EYE SURGERY Bilateral 2009   cataract  . GAS/FLUID EXCHANGE Right 09/26/2018   Procedure: Gas/Fluid Exchange;  Surgeon: Bernarda Caffey, MD;  Location: [facility];  Service: Ophthalmology;  Laterality: Right;  . PHOTOCOAGULATION WITH LASER Right 09/26/2018   Procedure: Photocoagulation With Laser;  Surgeon: Bernarda Caffey, MD;  Location: [facility];  Service: Ophthalmology;  Laterality: Right;  . SCLERAL BUCKLE Right 09/26/2018   Procedure: Scleral Buckle;  Surgeon: Bernarda Caffey, MD;  Location: [facility];  Service: Ophthalmology;  Laterality: Right;  . VITRECTOMY 25 GAUGE WITH SCLERAL BUCKLE Right 09/26/2018   Procedure: VITRECTOMY 25 GAUGE;  Surgeon:  Bernarda Caffey, MD;  Location: [facility];  Service: Ophthalmology;  Laterality: Right;    reports that he has been smoking cigars. He has a 18.00 pack-year smoking history. He has never used smokeless tobacco. He reports current alcohol use. No history on file for drug. family history includes Cancer in his mother and paternal grandmother; Heart disease in his father, maternal grandfather, and paternal grandfather; Hypertension in his father, maternal grandfather, mother, and paternal grandfather. Allergies  Allergen Reactions  . Penicillins     REACTION: hives   Wt Readings from Last 3 Encounters:  11/04/19 271 lb 14.4 oz (123.3 kg)  09/26/18 270 lb (122.5 kg)  08/14/18 270 lb 3.2 oz (122.6 kg)    Review of Systems  Constitutional: Negative for appetite change, fatigue and unexpected weight change.  Eyes: Negative for visual disturbance.  Respiratory: Negative for cough, chest tightness and shortness of breath.   Cardiovascular: Negative for chest pain, palpitations and leg swelling.  Endocrine: Negative for polydipsia and polyuria.  Neurological: Negative for dizziness, syncope, weakness, light-headedness and headaches.       Objective:   Physical Exam Vitals reviewed.  Constitutional:      Appearance: Normal appearance.  Cardiovascular:     Rate and Rhythm: Normal rate and regular rhythm.  Pulmonary:     Effort: Pulmonary effort is normal.     Breath sounds: Normal breath sounds.  Musculoskeletal:     Right lower leg: No edema.     Left lower leg: No edema.  Neurological:  General: No focal deficit present.     Mental Status: He is alert.     Cranial Nerves: No cranial nerve deficit.     Comments: Normal sensory function to touch right upper extremity.  Full strength upper extremity with no evidence for any muscle atrophy.        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   Suspect probably has had some contusion or entrapment at the elbow level.  No weakness.  Symptoms relatively minimal  #3 history of prediabetes range blood sugars.  Asymptomatic at this time.  Not monitoring regularly.    Plan:     -Check basic metabolic panel and hemoglobin A1c  -Refill HCTZ for 1 year  -Stressed importance of keeping pressure off his ulnar nerve.  -We discussed possible orthopedic referral to hand specialist for further evaluation of his ulnar nerve predicament but he would like to observe at this point.  Follow-up promptly for any weakness  Eulas Post MD [facility]

## 2019-11-04 NOTE — Patient Instructions (Signed)
Cubital Tunnel Syndrome  Cubital tunnel syndrome is a condition that causes pain and weakness of the forearm and hand. It happens when one of the nerves that runs along the inside of the elbow joint (ulnar nerve) becomes irritated. This condition is usually caused by repeated arm motions that are done during sports or work-related activities. What are the causes? This condition may be caused by:  Increased pressure on the ulnar nerve at the elbow, arm, or forearm. This can result from: ? Irritation caused by repeated elbow bending. ? Poorly healed elbow fractures. ? Tumors in the elbow. These are usually noncancerous (benign). ? Scar tissue that develops in the elbow after an injury. ? Bony growths (spurs) near the ulnar nerve.  Stretching of the nerve due to loose elbow ligaments.  Trauma to the nerve at the elbow. What increases the risk? The following factors may make you more likely to develop this condition:  Doing manual labor that requires frequent bending of the elbow.  Playing sports that include repeated or strenuous throwing motions, such as baseball.  Playing contact sports, such as football or lacrosse.  Not warming up properly before activities.  Having diabetes.  Having an underactive thyroid (hypothyroidism). What are the signs or symptoms? Symptoms of this condition include:  Clumsiness or weakness of the hand.  Tenderness of the inner elbow.  Aching or soreness of the inner elbow, forearm, or fingers, especially the little finger or the ring finger.  Increased pain when forcing the elbow to bend.  Reduced control when throwing objects.  Tingling, numbness, or a burning feeling inside the forearm or in part of the hand or fingers, especially the little finger or the ring finger.  Sharp pains that shoot from the elbow down to the wrist and hand.  The inability to grip or pinch hard. How is this diagnosed? This condition is diagnosed based on:  Your  symptoms and medical history. Your health care provider will also ask for details about any injury.  A physical exam. You may also have tests, including:  Electromyogram (EMG). This test measures electrical signals sent by your nerves into the muscles.  Nerve conduction study. This test measures how well electrical signals pass through your nerves.  Imaging tests, such as X-rays, ultrasound, and MRI. These tests check for possible causes of your condition. How is this treated? This condition may be treated by:  Stopping the activities that are causing your symptoms to get worse.  Icing and taking medicines to reduce pain and swelling.  Wearing a splint to prevent your elbow from bending, or wearing an elbow pad where the ulnar nerve is closest to the skin.  Working with a physical therapist in less severe cases. This may help to: ? Decrease your symptoms. ? Improve the strength and range of motion of your elbow, forearm, and hand. If these treatments do not help, surgery may be needed. Follow these instructions at home: If you have a splint:  Wear the splint as told by your health care provider. Remove it only as told by your health care provider.  Loosen the splint if your fingers tingle, become numb, or turn cold and blue.  Keep the splint clean.  If the splint is not waterproof: ? Do not let it get wet. ? Cover it with a watertight covering when you take a bath or shower. Managing pain, stiffness, and swelling   If directed, put ice on the injured area: ? Put ice in a plastic bag. ?   Place a towel between your skin and the bag. ? Leave the ice on for 20 minutes, 2-3 times a day.  Move your fingers often to avoid stiffness and to lessen swelling.  Raise (elevate) the injured area above the level of your heart while you are sitting or lying down. General instructions  Take over-the-counter and prescription medicines only as told by your health care provider.  Do any  exercise or physical therapy as told by your health care provider.  Do not drive or use heavy machinery while taking prescription pain medicine.  If you were given an elbow pad, wear it as told by your health care provider.  Keep all follow-up visits as told by your health care provider. This is important. Contact a health care provider if:  Your symptoms get worse.  Your symptoms do not get better with treatment.  You have new pain.  Your hand on the injured side feels numb or cold. Summary  Cubital tunnel syndrome is a condition that causes pain and weakness of the forearm and hand.  You are more likely to develop this condition if you do work or play sports that involve repeated arm movements.  This condition is often treated by stopping repetitive activities, applying ice, and using anti-inflammatory medicines.  In rare cases, surgery may be needed. This information is not intended to replace advice given to you by your health care provider. Make sure you discuss any questions you have with your health care provider. Document Revised: 11/12/2017 Document Reviewed: 11/12/2017 Elsevier Patient Education  2020 [employer]  

## 2019-11-17 ENCOUNTER — Encounter: Payer: Self-pay | Admitting: *Deleted

## 2019-11-18 ENCOUNTER — Other Ambulatory Visit: Payer: Self-pay

## 2019-11-18 ENCOUNTER — Ambulatory Visit (INDEPENDENT_AMBULATORY_CARE_PROVIDER_SITE_OTHER): Payer: Medicare Other | Admitting: Physician Assistant

## 2019-11-18 ENCOUNTER — Encounter: Payer: Self-pay | Admitting: Physician Assistant

## 2019-11-18 DIAGNOSIS — Z1283 Encounter for screening for malignant neoplasm of skin: Secondary | ICD-10-CM | POA: Diagnosis not present

## 2019-11-18 DIAGNOSIS — L821 Other seborrheic keratosis: Secondary | ICD-10-CM

## 2019-11-18 DIAGNOSIS — Z85828 Personal history of other malignant neoplasm of skin: Secondary | ICD-10-CM | POA: Diagnosis not present

## 2019-11-18 NOTE — Patient Instructions (Signed)
Wound Care Instructions  1. Cleanse wound gently with soap and water once a day then pat dry with clean gauze. Apply a thing coat of Petrolatum (petroleum jelly, "Vaseline") over the wound (unless you have an allergy to this). We recommend that you use a new, sterile tube of Vaseline. Do not pick or remove scabs. Do not remove the yellow or white "healing tissue" from the base of the wound.  2. Cover the wound with fresh, clean, nonstick gauze and secure with paper tape. You may use Band-Aids in place of gauze and tape if the would is small enough, but would recommend trimming much of the tape off as there is often too much. Sometimes Band-Aids can irritate the skin.  3. You should call the office for your biopsy report after 1 week if you have not already been contacted.  4. If you experience any problems, such as abnormal amounts of bleeding, swelling, significant bruising, significant pain, or evidence of infection, please call the office immediately.  5. FOR ADULT SURGERY PATIENTS: If you need something for pain relief you may take 1 extra strength Tylenol (acetaminophen) AND 2 Ibuprofen (200mg each) together every 4 hours as needed for pain. (do not take these if you are allergic to them or if you have a reason you should not take them.) Typically, you may only need pain medication for 1 to 3 days.   Wound Care Instructions  6. Cleanse wound gently with soap and water once a day then pat dry with clean gauze. Apply a thing coat of Petrolatum (petroleum jelly, "Vaseline") over the wound (unless you have an allergy to this). We recommend that you use a new, sterile tube of Vaseline. Do not pick or remove scabs. Do not remove the yellow or white "healing tissue" from the base of the wound.  7. Cover the wound with fresh, clean, nonstick gauze and secure with paper tape. You may use Band-Aids in place of gauze and tape if the would is small enough, but would recommend trimming much of the tape off  as there is often too much. Sometimes Band-Aids can irritate the skin.  8. You should call the office for your biopsy report after 1 week if you have not already been contacted.  9. If you experience any problems, such as abnormal amounts of bleeding, swelling, significant bruising, significant pain, or evidence of infection, please call the office immediately.  10. FOR ADULT SURGERY PATIENTS: If you need something for pain relief you may take 1 extra strength Tylenol (acetaminophen) AND 2 Ibuprofen (200mg each) together every 4 hours as needed for pain. (do not take these if you are allergic to them or if you have a reason you should not take them.) Typically, you may only need pain medication for 1 to 3 days.      

## 2019-11-18 NOTE — Progress Notes (Signed)
   Follow-Up Visit   Subjective  Samuel [name] is a 69 y.o. male who presents for the following: Follow-up (Here this morning for a 3 month follow-up. 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 Healed well. Has not noticed anything new.). Check spot on head has been there for 1 year.   The following portions of the chart were reviewed this encounter and updated as appropriate: Tobacco  Allergies  Meds  Problems  Med Hx  Surg Hx  Fam Hx      Objective  Well appearing patient in no apparent distress; mood and affect are within normal limits.  All skin waist up examined.  Objective  Left Forearm - Anterior: PIH, no DN  Objective  Mid Parietal Scalp: Stuck-on, waxy, tan-brown papules and plaques. --Discussed benign etiology and prognosis.    Assessment & Plan  Screening exam for skin cancer Left Forearm - Anterior  observe  Seborrheic keratosis Mid Parietal Scalp  observe

## 2019-11-26 ENCOUNTER — Other Ambulatory Visit: Payer: Self-pay | Admitting: Family Medicine

## 2019-12-27 ENCOUNTER — Other Ambulatory Visit: Payer: Self-pay | Admitting: Family Medicine

## 2020-01-05 ENCOUNTER — Encounter (INDEPENDENT_AMBULATORY_CARE_PROVIDER_SITE_OTHER): Payer: Medicare Other | Admitting: Ophthalmology

## 2020-01-05 NOTE — Progress Notes (Signed)
[facility] Note  01/07/2020     CHIEF COMPLAINT Patient presents for Retina Follow Up   HISTORY OF PRESENT ILLNESS: Samuel [name] is a 69 y.o. male who presents to the clinic today for:   HPI    Retina Follow Up    Patient presents with  Retinal Break/Detachment.  In right eye.  Duration of 9 months.  I, the attending physician,  performed the HPI with the patient and updated documentation appropriately.          Comments    9 month follow up RD c SB OD- Doing well.  He needs to have bifocal CL changed, reading is getting harder.  OTC readers resolves this problem.  o/w Vision stable OU. Dorz/Timolol OU qd and Systane OU BID.         Last edited by Bernarda Caffey, MD on 01/11/2020  2:26 AM. (History)    pt states vision is doing well, but he has started wearing reading glasses, he is still using Cosopt daily OU, he has an appt with Dr. Sabra Heck next week to see if his contact needs to be changed   Referring physician: Marica Otter, [place],  [place] [address]  HISTORICAL INFORMATION:   Selected notes from the MEDICAL RECORD NUMBER Referred by Dr. Marica Otter for concern of RD OD LEE: 3.18.20 Ammie Ferrier)  Ocular Hx- PMH-    CURRENT MEDICATIONS: Current Outpatient Medications (Ophthalmic Drugs)  Medication Sig   atropine 1 % ophthalmic solution Place 1 drop into the right eye 2 (two) times daily. (Patient not taking: Reported on 01/07/2020)   bacitracin-polymyxin b (POLYSPORIN) ophthalmic ointment Place 1 application into the right eye at bedtime. apply to eye every 12 hours while awake  (Patient not taking: Reported on 01/07/2020)   dorzolamide-timolol (COSOPT) 22.3-6.8 MG/ML ophthalmic solution Place 1 drop into both eyes daily.   prednisoLONE acetate (PRED FORTE) 1 % ophthalmic suspension Place 1 drop into the right eye 6 (six) times daily.   No current facility-administered medications for this visit. (Ophthalmic  Drugs)   Current Outpatient Medications (Other)  Medication Sig   clobetasol (OLUX) 0.05 % topical foam    colchicine 0.6 MG tablet In event of gout flare, take 2 pills, then 1 more pill an hour after   hydrochlorothiazide (HYDRODIURIL) 25 MG tablet Take 1 tablet (25 mg total) by mouth daily.   lisinopril (ZESTRIL) 20 MG tablet TAKE 1 TABLET(20 MG) BY MOUTH DAILY   metoprolol tartrate (LOPRESSOR) 50 MG tablet TAKE 1 TABLET(50 MG) BY MOUTH DAILY   sildenafil (VIAGRA) 100 MG tablet TAKE ONE-HALF TO ONE TABLET BY MOUTH DAILY AS NEEDED FOR ERECTILE DYSFUNCTION   No current facility-administered medications for this visit. (Other)      REVIEW OF SYSTEMS: ROS    Positive for: Skin, Eyes   Negative for: Constitutional, Gastrointestinal, Neurological, Genitourinary, Musculoskeletal, HENT, Endocrine, Cardiovascular, Respiratory, Psychiatric, Allergic/Imm, Heme/Lymph   Last edited by Leonie Douglas, COA on 01/07/2020 10:20 AM. (History)       ALLERGIES Allergies  Allergen Reactions   Penicillins     REACTION: hives    PAST MEDICAL HISTORY 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   severe- right anterior shoulder- (EXC)   Atypical nevus 07/18/2011   mild-mod-right upper abdomen   Basal cell carcinoma 07/17/2019   nod-left forearm-([id number])   EPISTAXIS, RECURRENT 12/21/2009   ESSENTIAL HYPERTENSION  12/21/2009   Retinal detachment    OD   VENTRICULAR HYPERTROPHY, LEFT 01/04/2010   Past Surgical History:  Procedure Laterality Date   CATARACT EXTRACTION Bilateral 2009   EYE SURGERY Bilateral 2009   cataract   GAS/FLUID EXCHANGE Right 09/26/2018   Procedure: Gas/Fluid Exchange;  Surgeon: Bernarda Caffey, MD;  Location: [facility];  Service: Ophthalmology;  Laterality: Right;   PHOTOCOAGULATION WITH LASER Right 09/26/2018   Procedure: Photocoagulation With Laser;  Surgeon: Bernarda Caffey, MD;  Location: [facility];   Service: Ophthalmology;  Laterality: Right;   SCLERAL BUCKLE Right 09/26/2018   Procedure: Scleral Buckle;  Surgeon: Bernarda Caffey, MD;  Location: [facility];  Service: Ophthalmology;  Laterality: Right;   VITRECTOMY 25 GAUGE WITH SCLERAL BUCKLE Right 09/26/2018   Procedure: VITRECTOMY 25 GAUGE;  Surgeon: Bernarda Caffey, MD;  Location: [facility];  Service: Ophthalmology;  Laterality: Right;    FAMILY HISTORY Family History  Problem Relation Age of Onset   Cancer Mother        bladder CA   Hypertension Mother    Hypertension Father    Heart disease Father    Heart disease Maternal Grandfather    Hypertension Maternal Grandfather    Cancer Paternal Grandmother        ovarian CA   Heart disease Paternal Grandfather    Hypertension Paternal Grandfather     SOCIAL HISTORY Social History   Tobacco Use   Smoking status: Light Tobacco Smoker    Packs/day: 1.00    Years: 18.00    Pack years: 18.00    Types: Cigars    Last attempt to quit: 08/22/1988    Years since quitting: 31.4   Smokeless tobacco: Never Used  Vaping Use   Vaping Use: Never used  Substance Use Topics   Alcohol use: Yes    Comment: social   Drug use: Never         OPHTHALMIC EXAM:  Base Eye Exam    Visual Acuity (Snellen - Linear)      Right Left   Dist [place]  20/40 +2   Dist cc 20/60 +2    Dist ph [place]  20/20 -2   Dist ph cc 20/30 -2    Correction: Contacts       Tonometry (Tonopen, 10:34 AM)      Right Left   Pressure 14 15       Pupils      Dark Light Shape React APD   Right 3 2 Round Minimal None   Left 3 2 Round Minimal None       Visual Fields (Counting fingers)      Left Right    Full Full       Extraocular Movement      Right Left    Full Full       Neuro/Psych    Oriented x3: Yes   Mood/Affect: Normal       Dilation    Both eyes: 1.0% Mydriacyl, 2.5% Phenylephrine @ 10:35 AM        Slit Lamp and Fundus Exam    Slit Lamp Exam      Right Left   Lids/Lashes  Dermatochalasis - upper lid Dermatochalasis - upper lid   Conjunctiva/Sclera White and quiet White and quiet   Cornea 2-3+ PEE, inferior, paracentral corneal haze mild Arcus, 1-2+ inferior Punctate epithelial erosions   Anterior Chamber Deep and quiet Deep and quiet   Iris Round and dilated Round and dilated  Lens 3 piece sulcus IOL in good position, open PC 3 piece IOL, open PC, ?sulcus   Vitreous post vitrectomy, clear Vitreous syneresis, vitreous condensations, Posterior vitreous detachment       Fundus Exam      Right Left   Disc Pink and Sharp Pink and Sharp, mild tilt   C/D Ratio 0.4 0.4   Macula Flat, blunted foveal reflex, RPE mottling, focal, nasal ERM - stable from prior, no heme Flat, Good foveal reflex, Retinal pigment epithelial mottling, trace Epiretinal membrane   Vessels Vascular attenuation, mild Tortuousity Mild Vascular attenuation, mild Tortuousity, +AV crossing changes   Periphery retina attached over buckle, good buckle height, good laser over buckle; Original detachment: bullous inferior detachment from 0230-0930 Attached, peripheral cystoid degeneration, no RT/RD         Refraction    Manifest Refraction      Sphere Cylinder Axis Dist VA   Right -4.25 +1.25 085 20/25-2   Left -0.75 Sphere  20/20-2          IMAGING AND PROCEDURES  Imaging and Procedures for _0 @  OCT, Retina - OU - Both Eyes       Right Eye Quality was good. Central Foveal Thickness: 321. Progression has been stable. Findings include normal foveal contour, no SRF, no IRF, epiretinal membrane, outer retinal atrophy (Retinal re-attached, macula re-attached, good foveal depression, trace ellipsoid thinning - improving, focal ERM nasal macula).   Left Eye Quality was good. Central Foveal Thickness: 307. Progression has been stable. Findings include normal foveal contour, no SRF, no IRF (Trace ERM).   Notes *Images captured and stored on drive  Diagnosis / Impression:  OD: NFP, no  IRF/SRF, Retina re-attached, macula re-attached, good foveal depression, trace ellipsoid thinning - improving OS: NFP, no IRF/SRF   Clinical management:  See below  Abbreviations: NFP - Normal foveal profile. CME - cystoid macular edema. PED - pigment epithelial detachment. IRF - intraretinal fluid. SRF - subretinal fluid. EZ - ellipsoid zone. ERM - epiretinal membrane. ORA - outer retinal atrophy. ORT - outer retinal tubulation. SRHM - subretinal hyper-reflective material                 ASSESSMENT/PLAN:    ICD-10-CM   1. Right retinal detachment  H33.21   2. Retinal edema  H35.81 OCT, Retina - OU - Both Eyes  3. Epiretinal membrane (ERM) of left eye  H35.372   4. 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 Rhegmatogenous retinal detachment, RIGHT EYE  - bullous inferior mac off detachment, 0230-0930, fovea off  - onset of foveal involvement Wednesday, 03.18.20 by history  - s/p SBP + PPV/PFC/EL/FAX/14% C3F8 OD, 03.19.2020             - retina attached and in good position -- good buckle height and laser around breaks  - BCVA: 20/25-2 (MRx)  - cont Cosopt Qdaily OU  - cont Artificial tears QID PRN OD  - f/u 65yr 3. Epiretinal membrane, OS   - mild ERM  - asymptomatic, no metamorphopsia  - no indication for surgery at this time  - monitor for now  4. Pseudophakia OU  - s/p CE/IOL - Dr. EDolores Lory(2010)  - sulcus IOLs  - monitor  5. Ocular hypertension OU  - IOP 14, 15 and stably good on Cosopt qdaily OU   Ophthalmic Meds Ordered this visit:  No orders of the defined types were  placed in this encounter.      Return in about 1 year (around 01/06/2021) for f/u RD OD, DFE, OCT.  There are no Patient Instructions on file for this visit.   Explained the diagnoses, plan, and follow up with the patient and they expressed understanding.  Patient expressed understanding of the importance of proper follow up care.   This  document serves as a record of services personally performed by Gardiner Sleeper, MD, PhD. It was created on their behalf by Roselee Nova, COMT. The creation of this record is the provider's dictation and/or activities during the visit.  Electronically signed by: Roselee Nova, COMT 01/11/20 2:29 AM  This document serves as a record of services personally performed by Gardiner Sleeper, MD, PhD. It was created on their behalf by San Jetty. Owens Shark, COT, an ophthalmic technician. The creation of this record is the provider's dictation and/or activities during the visit.    Electronically signed by: San Jetty. Owens Shark, [place] 06.30.2021 2:29 AM   Gardiner Sleeper, M.D., Ph.D. Diseases & Surgery of the Retina and Vitreous [facility]  I have reviewed the above documentation for accuracy and completeness, and I agree with the above. Gardiner Sleeper, M.D., Ph.D. 01/11/20 2:29 AM    Abbreviations: M myopia (nearsighted); A astigmatism; H hyperopia (farsighted); P presbyopia; Mrx spectacle prescription;  CTL contact lenses; OD right eye; OS left eye; OU both eyes  XT exotropia; ET esotropia; PEK punctate epithelial keratitis; PEE punctate epithelial erosions; DES dry eye syndrome; MGD meibomian gland dysfunction; ATs artificial tears; PFAT's preservative free artificial tears; [place] nuclear sclerotic cataract; PSC posterior subcapsular cataract; ERM epi-retinal membrane; PVD posterior vitreous detachment; RD retinal detachment; DM diabetes mellitus; DR diabetic retinopathy; NPDR non-proliferative diabetic retinopathy; PDR proliferative diabetic retinopathy; CSME clinically significant macular edema; DME diabetic macular edema; dbh dot blot hemorrhages; CWS cotton wool spot; POAG primary open angle glaucoma; C/D cup-to-disc ratio; HVF humphrey visual field; GVF goldmann visual field; OCT optical coherence tomography; IOP intraocular pressure; BRVO Branch retinal vein occlusion; CRVO central retinal  vein occlusion; CRAO central retinal artery occlusion; BRAO branch retinal artery occlusion; RT retinal tear; SB scleral buckle; PPV pars plana vitrectomy; VH Vitreous hemorrhage; PRP panretinal laser photocoagulation; IVK intravitreal kenalog; VMT vitreomacular traction; MH Macular hole;  NVD neovascularization of the disc; NVE neovascularization elsewhere; AREDS age related eye disease study; ARMD age related macular degeneration; POAG primary open angle glaucoma; EBMD epithelial/anterior basement membrane dystrophy; ACIOL anterior chamber intraocular lens; IOL intraocular lens; PCIOL posterior chamber intraocular lens; Phaco/IOL phacoemulsification with intraocular lens placement; [place] photorefractive keratectomy; LASIK laser assisted in situ keratomileusis; HTN hypertension; DM diabetes mellitus; COPD chronic obstructive pulmonary disease

## 2020-01-07 ENCOUNTER — Ambulatory Visit (INDEPENDENT_AMBULATORY_CARE_PROVIDER_SITE_OTHER): Payer: Medicare Other | Admitting: Ophthalmology

## 2020-01-07 ENCOUNTER — Other Ambulatory Visit: Payer: Self-pay

## 2020-01-07 DIAGNOSIS — H35372 Puckering of macula, left eye: Secondary | ICD-10-CM | POA: Diagnosis not present

## 2020-01-07 DIAGNOSIS — Z961 Presence of intraocular lens: Secondary | ICD-10-CM | POA: Diagnosis not present

## 2020-01-07 DIAGNOSIS — H3581 Retinal edema: Secondary | ICD-10-CM

## 2020-01-07 DIAGNOSIS — H40053 Ocular hypertension, bilateral: Secondary | ICD-10-CM

## 2020-01-07 DIAGNOSIS — H3321 Serous retinal detachment, right eye: Secondary | ICD-10-CM | POA: Diagnosis not present

## 2020-01-09 ENCOUNTER — Ambulatory Visit (INDEPENDENT_AMBULATORY_CARE_PROVIDER_SITE_OTHER): Payer: Medicare Other

## 2020-01-09 ENCOUNTER — Ambulatory Visit (INDEPENDENT_AMBULATORY_CARE_PROVIDER_SITE_OTHER): Payer: Medicare Other | Admitting: Podiatry

## 2020-01-09 ENCOUNTER — Other Ambulatory Visit: Payer: Self-pay

## 2020-01-09 ENCOUNTER — Encounter: Payer: Self-pay | Admitting: Podiatry

## 2020-01-09 DIAGNOSIS — M779 Enthesopathy, unspecified: Secondary | ICD-10-CM

## 2020-01-09 DIAGNOSIS — M1A0710 Idiopathic chronic gout, right ankle and foot, without tophus (tophi): Secondary | ICD-10-CM | POA: Diagnosis not present

## 2020-01-09 MED ORDER — COLCHICINE 0.6 MG PO TABS: ORAL_TABLET | ORAL | 0 refills | Status: DC

## 2020-01-09 NOTE — Progress Notes (Signed)
  Subjective:  Patient ID: Samuel [name], male    DOB: 1950-12-13,  MRN: [id number]  No chief complaint on file.   69 y.o. male presents with the above complaint. History confirmed with patient.  Notes pain in both feet, most recently the right which has been worse on the left.  He notes about 3 months ago he feels he stepped on a root awkwardly and twisted his foot and this caused severe pain and swelling, especially this from the time it happened which shows severe edema and bright rubor.  He said that he may have been diagnosed with gout, Dr. Paulla Dolly and his PCP had worked him up for this in 2019.  He has never been on chronic medication.  Today is not having any pain or swelling or redness.  Objective:  Physical Exam: warm, good capillary refill, no trophic changes or ulcerative lesions, normal DP and PT pulses and normal sensory exam. Left Foot: normal exam, no swelling, tenderness, instability; ligaments intact, full range of motion of all ankle/foot joints  Right Foot: normal exam, no swelling, tenderness, instability; ligaments intact, full range of motion of all ankle/foot joints   No images are attached to the encounter.  Radiographs: X-ray of the left and right foot: no fracture, dislocation, swelling or degenerative changes noted and No evidence of gouty tophi or erosions Assessment:   1. Chronic gout of right foot, unspecified cause      Plan:  Patient was evaluated and treated and all questions answered.  -Reviewed the x-ray in detail with the patient.  No evidence of stress fracture or acute fracture from him twisting his ankle.  I believe that he twisted his ankle and brought on an acute gout flare around the same time as this.  Reviewed his previous lab work which showed negative rheumatic work-up and 8.0 uric acid level which is borderline.  He only has intermittent issues with this, and I do not think that currently he needs to be on chronic therapy.  I prescribed him  colchicine to have on hand in the event that another flare arises.  If he has more recurrent flares then we will discuss further chronic management with his PCP.  Return if symptoms worsen or fail to improve.

## 2020-01-09 NOTE — Patient Instructions (Signed)
Gout  Gout is painful swelling of your joints. Gout is a type of arthritis. It is caused by having too much uric acid in your body. Uric acid is a chemical that is made when your body breaks down substances called purines. If your body has too much uric acid, sharp crystals can form and build up in your joints. This causes pain and swelling. Gout attacks can happen quickly and be very painful (acute gout). Over time, the attacks can affect more joints and happen more often (chronic gout). What are the causes?  Too much uric acid in your blood. This can happen because: ? Your kidneys do not remove enough uric acid from your blood. ? Your body makes too much uric acid. ? You eat too many foods that are high in purines. These foods include organ meats, some seafood, and beer.  Trauma or stress. What increases the risk?  Having a family history of gout.  Being male and middle-aged.  Being male and having gone through menopause.  Being very overweight (obese).  Drinking alcohol, especially beer.  Not having enough water in the body (being dehydrated).  Losing weight too quickly.  Having an organ transplant.  Having lead poisoning.  Taking certain medicines.  Having kidney disease.  Having a skin condition called psoriasis. What are the signs or symptoms? An attack of acute gout usually happens in just one joint. The most common place is the big toe. Attacks often start at night. Other joints that may be affected include joints of the feet, ankle, knee, fingers, wrist, or elbow. Symptoms of an attack may include:  Very bad pain.  Warmth.  Swelling.  Stiffness.  Shiny, red, or purple skin.  Tenderness. The affected joint may be very painful to touch.  Chills and fever. Chronic gout may cause symptoms more often. More joints may be involved. You may also have white or yellow lumps (tophi) on your hands or feet or in other areas near your joints. How is this  treated?  Treatment for this condition has two phases: treating an acute attack and preventing future attacks.  Acute gout treatment may include: ? NSAIDs. ? Steroids. These are taken by mouth or injected into a joint. ? Colchicine. This medicine relieves pain and swelling. It can be given by mouth or through an IV tube.  Preventive treatment may include: ? Taking small doses of NSAIDs or colchicine daily. ? Using a medicine that reduces uric acid levels in your blood. ? Making changes to your diet. You may need to see a food expert (dietitian) about what to eat and drink to prevent gout. Follow these instructions at home: During a gout attack   If told, put ice on the painful area: ? Put ice in a plastic bag. ? Place a towel between your skin and the bag. ? Leave the ice on for 20 minutes, 2-3 times a day.  Raise (elevate) the painful joint above the level of your heart as often as you can.  Rest the joint as much as possible. If the joint is in your leg, you may be given crutches.  Follow instructions from your doctor about what you cannot eat or drink. Avoiding future gout attacks  Eat a low-purine diet. Avoid foods and drinks such as: ? Liver. ? Kidney. ? Anchovies. ? Asparagus. ? Herring. ? Mushrooms. ? Mussels. ? Beer.  Stay at a healthy weight. If you want to lose weight, talk with your doctor. Do not lose weight   too fast.  Start or continue an exercise plan as told by your doctor. Eating and drinking  Drink enough fluids to keep your pee (urine) pale yellow.  If you drink alcohol: ? Limit how much you use to:  0-1 drink a day for women.  0-2 drinks a day for men. ? Be aware of how much alcohol is in your drink. In the [place] one drink equals one 12 oz bottle of beer (355 mL), one 5 oz glass of wine (148 mL), or one 1 oz glass of hard liquor (44 mL). General instructions  Take over-the-counter and prescription medicines only as told by your doctor.  Do  not drive or use heavy machinery while taking prescription pain medicine.  Return to your normal activities as told by your doctor. Ask your doctor what activities are safe for you.  Keep all follow-up visits as told by your doctor. This is important. Contact a doctor if:  You have another gout attack.  You still have symptoms of a gout attack after 10 days of treatment.  You have problems (side effects) because of your medicines.  You have chills or a fever.  You have burning pain when you pee (urinate).  You have pain in your lower back or belly. Get help right away if:  You have very bad pain.  Your pain cannot be controlled.  You cannot pee. Summary  Gout is painful swelling of the joints.  The most common site of pain is the big toe, but it can affect other joints.  Medicines and avoiding some foods can help to prevent and treat gout attacks. This information is not intended to replace advice given to you by your health care provider. Make sure you discuss any questions you have with your health care provider. Document Revised: 01/16/2018 Document Reviewed: 01/16/2018 Elsevier Patient Education  2020 Elsevier Inc.    Low-Purine Eating Plan A low-purine eating plan involves making food choices to limit your intake of purine. Purine is a kind of uric acid. Too much uric acid in your blood can cause certain conditions, such as gout and kidney stones. Eating a low-purine diet can help control these conditions. What are tips for following this plan? Reading food labels   Avoid foods with saturated or Trans fat.  Check the ingredient list of grains-based foods, such as bread and cereal, to make sure that they contain whole grains.  Check the ingredient list of sauces or soups to make sure they do not contain meat or fish.  When choosing soft drinks, check the ingredient list to make sure they do not contain high-fructose corn syrup. Shopping  Buy plenty of fresh  fruits and vegetables.  Avoid buying canned or fresh fish.  Buy dairy products labeled as low-fat or nonfat.  Avoid buying premade or processed foods. These foods are often high in fat, salt (sodium), and added sugar. Cooking  Use olive oil instead of butter when cooking. Oils like olive oil, canola oil, and sunflower oil contain healthy fats. Meal planning  Learn which foods do or do not affect you. If you find out that a food tends to cause your gout symptoms to flare up, avoid eating that food. You can enjoy foods that do not cause problems. If you have any questions about a food item, talk with your dietitian or health care provider.  Limit foods high in fat, especially saturated fat. Fat makes it harder for your body to get rid of uric acid.    Choose foods that are lower in fat and are lean sources of protein. General guidelines  Limit alcohol intake to no more than 1 drink a day for nonpregnant women and 2 drinks a day for men. One drink equals 12 oz of beer, 5 oz of wine, or 1 oz of hard liquor. Alcohol can affect the way your body gets rid of uric acid.  Drink plenty of water to keep your urine clear or pale yellow. Fluids can help remove uric acid from your body.  If directed by your health care provider, take a vitamin C supplement.  Work with your health care provider and dietitian to develop a plan to achieve or maintain a healthy weight. Losing weight can help reduce uric acid in your blood. What foods are recommended? The items listed may not be a complete list. Talk with your dietitian about what dietary choices are best for you. Foods low in purines Foods low in purines do not need to be limited. These include:  All fruits.  All low-purine vegetables, pickles, and olives.  Breads, pasta, rice, cornbread, and popcorn. Cake and other baked goods.  All dairy foods.  Eggs, nuts, and nut butters.  Spices and condiments, such as salt, herbs, and vinegar.  Plant  oils, butter, and margarine.  Water, sugar-free soft drinks, tea, coffee, and cocoa.  Vegetable-based soups, broths, sauces, and gravies. Foods moderate in purines Foods moderate in purines should be limited to the amounts listed.   cup of asparagus, cauliflower, spinach, mushrooms, or green peas, each day.  2/3 cup uncooked oatmeal, each day.   cup dry wheat bran or wheat germ, each day.  2-3 ounces of meat or poultry, each day.  4-6 ounces of shellfish, such as crab, lobster, oysters, or shrimp, each day.  1 cup cooked beans, peas, or lentils, each day.  Soup, broths, or bouillon made from meat or fish. Limit these foods as much as possible. What foods are not recommended? The items listed may not be a complete list. Talk with your dietitian about what dietary choices are best for you. Limit your intake of foods high in purines, including:  Beer and other alcohol.  Meat-based gravy or sauce.  Canned or fresh fish, such as: ? Anchovies, sardines, herring, and tuna. ? Mussels and scallops. ? Codfish, trout, and haddock.  Bacon.  Organ meats, such as: ? Liver or kidney. ? Tripe. ? Sweetbreads (thymus gland or pancreas).  Wild game or goose.  Yeast or yeast extract supplements.  Drinks sweetened with high-fructose corn syrup. Summary  Eating a low-purine diet can help control conditions caused by too much uric acid in the body, such as gout or kidney stones.  Choose low-purine foods, limit alcohol, and limit foods high in fat.  You will learn over time which foods do or do not affect you. If you find out that a food tends to cause your gout symptoms to flare up, avoid eating that food. This information is not intended to replace advice given to you by your health care provider. Make sure you discuss any questions you have with your health care provider. Document Revised: 06/08/2017 Document Reviewed: 08/09/2016 Elsevier Patient Education  2020 Elsevier  Inc.   

## 2020-01-11 ENCOUNTER — Encounter (INDEPENDENT_AMBULATORY_CARE_PROVIDER_SITE_OTHER): Payer: Self-pay | Admitting: Ophthalmology

## 2020-02-10 ENCOUNTER — Ambulatory Visit (INDEPENDENT_AMBULATORY_CARE_PROVIDER_SITE_OTHER): Payer: Medicare Other

## 2020-02-10 DIAGNOSIS — Z1211 Encounter for screening for malignant neoplasm of colon: Secondary | ICD-10-CM | POA: Diagnosis not present

## 2020-02-10 DIAGNOSIS — Z0000 Encounter for general adult medical examination without abnormal findings: Secondary | ICD-10-CM

## 2020-02-10 NOTE — Progress Notes (Addendum)
Virtual Visit via Telephone Note  I connected with  Vennie Waymire Hoehn on 02/10/20 at 11:00 AM EDT by telephone and verified that I am speaking with the correct person using two identifiers.  Medicare Annual Wellness visit completed telephonically due to Covid-19 pandemic.   Persons participating in this call: This Health Coach and this patient.   Location: Patient: Home Provider: Office   I discussed the limitations, risks, security and privacy concerns of performing an evaluation and management service by telephone and the availability of in person appointments. The patient expressed understanding and agreed to proceed.  Unable to perform video visit due to video visit attempted and failed and/or patient does not have video capability.   Some vital signs may be absent or patient reported.   Willette Brace, LPN    Subjective:   Terris Germano Adler is a 69 y.o. male who presents for Medicare Annual/Subsequent preventive examination.  Review of Systems     Cardiac Risk Factors include: male gender;hypertension;obesity (BMI >30kg/m2) (pre diabetic)     Objective:    There were no vitals filed for this visit. There is no height or weight on file to calculate BMI.  Advanced Directives 02/10/2020 09/26/2018  Does Patient Have a Medical Advance Directive? Yes Yes  Type of Paramedic of [place];Living will -  Does patient want to make changes to medical advance directive? - No - Patient declined  Copy of [place] in Chart? No - copy requested -    Current Medications (verified) Outpatient Encounter Medications as of 02/10/2020  Medication Sig   clobetasol (OLUX) 0.05 % topical foam    dorzolamide-timolol (COSOPT) 22.3-6.8 MG/ML ophthalmic solution Place 1 drop into both eyes daily.   hydrochlorothiazide (HYDRODIURIL) 25 MG tablet Take 1 tablet (25 mg total) by mouth daily.   lisinopril (ZESTRIL) 20 MG tablet TAKE 1 TABLET(20 MG) BY MOUTH  DAILY   metoprolol tartrate (LOPRESSOR) 50 MG tablet TAKE 1 TABLET(50 MG) BY MOUTH DAILY   sildenafil (VIAGRA) 100 MG tablet TAKE ONE-HALF TO ONE TABLET BY MOUTH DAILY AS NEEDED FOR ERECTILE DYSFUNCTION   atropine 1 % ophthalmic solution Place 1 drop into the right eye 2 (two) times daily. (Patient not taking: Reported on 01/07/2020)   bacitracin-polymyxin b (POLYSPORIN) ophthalmic ointment Place 1 application into the right eye at bedtime. apply to eye every 12 hours while awake  (Patient not taking: Reported on 01/07/2020)   colchicine 0.6 MG tablet In event of gout flare, take 2 pills, then 1 more pill an hour after (Patient not taking: Reported on 02/10/2020)   prednisoLONE acetate (PRED FORTE) 1 % ophthalmic suspension Place 1 drop into the right eye 6 (six) times daily. (Patient not taking: Reported on 02/10/2020)   No facility-administered encounter medications on file as of 02/10/2020.    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/2011   mild-mod-right upper abdomen   Basal cell carcinoma 07/17/2019   nod-left forearm-(CX35FU)   EPISTAXIS, RECURRENT 12/21/2009   ESSENTIAL HYPERTENSION 12/21/2009   Retinal detachment    OD   VENTRICULAR HYPERTROPHY, LEFT 01/04/2010   Past Surgical History:  Procedure Laterality Date   CATARACT EXTRACTION Bilateral 2009   EYE SURGERY Bilateral 2009   cataract   GAS/FLUID EXCHANGE Right 09/26/2018   Procedure: Gas/Fluid Exchange;  Surgeon: Bernarda Caffey, MD;  Location: [facility];  Service: Ophthalmology;  Laterality: Right;   PHOTOCOAGULATION WITH LASER Right 09/26/2018   Procedure: Photocoagulation With Laser;  Surgeon: Bernarda Caffey, MD;  Location: [facility];  Service: Ophthalmology;  Laterality: Right;   SCLERAL BUCKLE Right 09/26/2018   Procedure: Scleral Buckle;   Surgeon: Bernarda Caffey, MD;  Location: [facility];  Service: Ophthalmology;  Laterality: Right;   VITRECTOMY 25 GAUGE WITH SCLERAL BUCKLE Right 09/26/2018   Procedure: VITRECTOMY 25 GAUGE;  Surgeon: Bernarda Caffey, MD;  Location: [facility];  Service: Ophthalmology;  Laterality: Right;   Family History  Problem Relation Age of Onset   Cancer Mother        bladder CA   Hypertension Mother    Hypertension Father    Heart disease Father    Heart disease Maternal Grandfather    Hypertension Maternal Grandfather    Cancer Paternal Grandmother        ovarian CA   Heart disease Paternal Grandfather    Hypertension Paternal Grandfather    Social History   Socioeconomic History   Marital status: Married    Spouse name: Not on file   Number of children: Not on file   Years of education: Not on file   Highest education level: Not on file  Occupational History   Occupation: Insuarnce agent  Tobacco Use   Smoking status: Light Tobacco Smoker    Packs/day: 1.00    Years: 18.00    Pack years: 18.00    Types: Cigars    Last attempt to quit: 08/22/1988    Years since quitting: 31.4   Smokeless tobacco: Never Used  Vaping Use   Vaping Use: Never used  Substance and Sexual Activity   Alcohol use: Yes    Comment: social   Drug use: Never   Sexual activity: Not on file  Other Topics Concern   Not on file  Social History Narrative   Not on file   Social Determinants of Health   Financial Resource Strain:    Difficulty of Paying Living Expenses:   Food Insecurity:    Worried About Charity fundraiser in the Last Year:    Arboriculturist in the Last Year:   Transportation Needs:    Film/video editor (Medical):    Lack of Transportation (Non-Medical):   Physical Activity:    Days of Exercise per Week:    Minutes of Exercise per Session:   Stress:    Feeling of Stress :   Social Connections:    Frequency of Communication with Friends and Family:     Frequency of Social Gatherings with Friends and Family:    Attends Religious Services:    Active Member of Clubs or Organizations:    Attends Music therapist:    Marital Status:     Tobacco Counseling Ready to quit: Not Answered Counseling given: Not Answered   Clinical Intake:  Pre-visit preparation completed: Yes  Pain : No/denies pain     BMI - recorded: 39.01 Nutritional Status: BMI > 30  Obese Diabetes: No  How often do you need to have someone help you when you read instructions, pamphlets, or other written materials from your doctor or pharmacy?: 1 - Never  Diabetic?No  Interpreter Needed?: No  Information entered by :: Charlott Rakes, LPN   Activities of Daily Living In your present state of health, do you have any difficulty performing the following activities: 02/10/2020  Hearing? N  Vision? N  Difficulty  concentrating or making decisions? N  Walking or climbing stairs? N  Dressing or bathing? N  Doing errands, shopping? N  Preparing Food and eating ? N  Using the Toilet? N  In the past six months, have you accidently leaked urine? N  Do you have problems with loss of bowel control? N  Managing your Medications? N  Managing your Finances? N  Housekeeping or managing your Housekeeping? N  Some recent data might be hidden    Patient Care Team: Eulas Post, MD as PCP - General  Indicate any recent Medical Services you may have received from other than [facility] providers in the past year (date may be approximate).     Assessment:   This is a routine wellness examination for Adyn.  Hearing/Vision screen  Hearing Screening   125Hz  250Hz  500Hz  1000Hz  2000Hz  3000Hz  4000Hz  6000Hz  8000Hz   Right ear:           Left ear:           Comments: Pt denies any hearing difficulty  Vision Screening Comments: Follows up with annual eye exams with Marica Otter and Dr Nino Parsley  Dietary issues and exercise activities discussed: Current  Exercise Habits: The patient does not participate in regular exercise at present, Exercise limited by: None identified  Goals     Patient Stated     Lose weight      Depression Screen PHQ 2/9 Scores 02/10/2020 11/04/2019 08/14/2018  PHQ - 2 Score 0 0 0  PHQ- 9 Score - - 0    Fall Risk Fall Risk  02/10/2020  Falls in the past year? 0  Number falls in past yr: 0  Injury with Fall? 0  Risk for fall due to : Impaired vision    Any stairs in or around the home? Yes  If so, are there any without handrails? No  Home free of loose throw rugs in walkways, pet beds, electrical cords, etc? Yes  Adequate lighting in your home to reduce risk of falls? Yes   ASSISTIVE DEVICES UTILIZED TO PREVENT FALLS:  Life alert? No  Use of a cane, walker or w/c? No  Grab bars in the bathroom? No  Shower chair or bench in shower? No  Elevated toilet seat or a handicapped toilet? Yes   TIMED UP AND GO:  Was the test performed? No .   Cognitive Function:     6CIT Screen 02/10/2020  What Year? 0 points  What month? 0 points  Count back from 20 0 points  Months in reverse 0 points  Repeat phrase 0 points    Immunizations Immunization History  Administered Date(s) Administered   Influenza,inj,Quad PF,6+ Mos 04/01/2014   Moderna SARS-COVID-2 Vaccination 09/10/2019, 10/14/2019   Pneumococcal Conjugate-13 08/14/2018   Td 02/18/2010   Zoster Recombinat (Shingrix) 01/09/2017, 07/17/2017    TDAP status: Due, Education has been provided regarding the importance of this vaccine. Advised may receive this vaccine at local pharmacy or Health Dept. Aware to provide a copy of the vaccination record if obtained from local pharmacy or Health Dept. Verbalized acceptance and understanding. Flu Vaccine status: Up to date Pneumococcal vaccine status: Up to date Covid-19 vaccine status: Completed vaccines  Qualifies for Shingles Vaccine? Yes   Zostavax completed Yes   Shingrix Completed?: Yes  Screening  Tests Health Maintenance  Topic Date Due   COLONOSCOPY  Never done   PNA vac Low Risk Adult (2 of 2 - PPSV23) 08/15/2019   INFLUENZA VACCINE  02/08/2020   TETANUS/TDAP  02/19/2020   COVID-19 Vaccine  Completed   Hepatitis C Screening  Completed    Health Maintenance  Health Maintenance Due  Topic Date Due   COLONOSCOPY  Never done   PNA vac Low Risk Adult (2 of 2 - PPSV23) 08/15/2019   INFLUENZA VACCINE  02/08/2020    Colorectal cancer screening ordered cologuard 02/10/20    Additional Screening:  Hepatitis C Screening: does qualify  Vision Screening: Recommended annual ophthalmology exams for early detection of glaucoma and other disorders of the eye. Is the patient up to date with their annual eye exam?  Yes  Who is the provider or what is the name of the office in which the patient attends annual eye exams? Dr Camillo Flaming [facility]   Dental Screening: Recommended annual dental exams for proper oral hygiene  Community Resource Referral / Chronic Care Management: CRR required this visit?  No   CCM required this visit?  No      Plan:     I have personally reviewed and noted the following in the patients chart:    Medical and social history  Use of alcohol, tobacco or illicit drugs   Current medications and supplements  Functional ability and status  Nutritional status  Physical activity  Advanced directives  List of other physicians  Hospitalizations, surgeries, and ER visits in previous 12 months  Vitals  Screenings to include cognitive, depression, and falls  Referrals and appointments  In addition, I have reviewed and discussed with patient certain preventive protocols, quality metrics, and best practice recommendations. A written personalized care plan for preventive services as well as general preventive health recommendations were provided to patient.     Willette Brace, LPN   12/09/3760   Nurse Notes: None

## 2020-02-10 NOTE — Patient Instructions (Addendum)
Mr. Samuel [name] , Thank you for taking time to come for your Medicare Wellness Visit. I appreciate your ongoing commitment to your health goals. Please review the following plan we discussed and let me know if I can assist you in the future.   Screening recommendations/referrals: Colonoscopy: Color guard Completed last year per pt Recommended yearly ophthalmology/optometry visit for glaucoma screening and checkup Recommended yearly dental visit for hygiene and checkup  Vaccinations: Influenza vaccine: Due  Pneumococcal vaccine: Due  Tdap vaccine: Done 02/18/10 Discussed Shingles vaccine: Completed   Covid-19: Completed  Advanced directives: Please bring a copy of your health care power of attorney and living will to the office at your convenience.  Conditions/risks identified: Lose weight  Next appointment: Follow up in one year for your annual wellness visit.   Preventive Care 67 Years and Older, Male Preventive care refers to lifestyle choices and visits with your health care provider that can promote health and wellness. What does preventive care include?  A yearly physical exam. This is also called an annual well check.  Dental exams once or twice a year.  Routine eye exams. Ask your health care provider how often you should have your eyes checked.  Personal lifestyle choices, including:  Daily care of your teeth and gums.  Regular physical activity.  Eating a healthy diet.  Avoiding tobacco and drug use.  Limiting alcohol use.  Practicing safe sex.  Taking low doses of aspirin every day.  Taking vitamin and mineral supplements as recommended by your health care provider. What happens during an annual well check? The services and screenings done by your health care provider during your annual well check will depend on your age, overall health, lifestyle risk factors, and family history of disease. Counseling  Your health care provider may ask you questions about  your:  Alcohol use.  Tobacco use.  Drug use.  Emotional well-being.  Home and relationship well-being.  Sexual activity.  Eating habits.  History of falls.  Memory and ability to understand (cognition).  Work and work Statistician. Screening  You may have the following tests or measurements:  Height, weight, and BMI.  Blood pressure.  Lipid and cholesterol levels. These may be checked every 5 years, or more frequently if you are over 67 years old.  Skin check.  Lung cancer screening. You may have this screening every year starting at age 67 if you have a 30-pack-year history of smoking and currently smoke or have quit within the past 15 years.  Fecal occult blood test (FOBT) of the stool. You may have this test every year starting at age 67.  Flexible sigmoidoscopy or colonoscopy. You may have a sigmoidoscopy every 5 years or a colonoscopy every 10 years starting at age 67.  Prostate cancer screening. Recommendations will vary depending on your family history and other risks.  Hepatitis C blood test.  Hepatitis B blood test.  Sexually transmitted disease (STD) testing.  Diabetes screening. This is done by checking your blood sugar (glucose) after you have not eaten for a while (fasting). You may have this done every 1-3 years.  Abdominal aortic aneurysm (AAA) screening. You may need this if you are a current or former smoker.  Osteoporosis. You may be screened starting at age 3 if you are at high risk. Talk with your health care provider about your test results, treatment options, and if necessary, the need for more tests. Vaccines  Your health care provider may recommend certain vaccines, such as:  Influenza  vaccine. This is recommended every year.  Tetanus, diphtheria, and acellular pertussis (Tdap, Td) vaccine. You may need a Td booster every 10 years.  Zoster vaccine. You may need this after age 25.  Pneumococcal 13-valent conjugate (PCV13) vaccine.  One dose is recommended after age 76.  Pneumococcal polysaccharide (PPSV23) vaccine. One dose is recommended after age 43. Talk to your health care provider about which screenings and vaccines you need and how often you need them. This information is not intended to replace advice given to you by your health care provider. Make sure you discuss any questions you have with your health care provider. Document Released: 07/23/2015 Document Revised: 03/15/2016 Document Reviewed: 04/27/2015 Elsevier Interactive Patient Education  2017 [place] Prevention in the Home Falls can cause injuries. They can happen to people of all ages. There are many things you can do to make your home safe and to help prevent falls. What can I do on the outside of my home?  Regularly fix the edges of walkways and driveways and fix any cracks.  Remove anything that might make you trip as you walk through a door, such as a raised step or threshold.  Trim any bushes or trees on the path to your home.  Use bright outdoor lighting.  Clear any walking paths of anything that might make someone trip, such as rocks or tools.  Regularly check to see if handrails are loose or broken. Make sure that both sides of any steps have handrails.  Any raised decks and porches should have guardrails on the edges.  Have any leaves, snow, or ice cleared regularly.  Use sand or salt on walking paths during winter.  Clean up any spills in your garage right away. This includes oil or grease spills. What can I do in the bathroom?  Use night lights.  Install grab bars by the toilet and in the tub and shower. Do not use towel bars as grab bars.  Use non-skid mats or decals in the tub or shower.  If you need to sit down in the shower, use a plastic, non-slip stool.  Keep the floor dry. Clean up any water that spills on the floor as soon as it happens.  Remove soap buildup in the tub or shower regularly.  Attach bath  mats securely with double-sided non-slip rug tape.  Do not have throw rugs and other things on the floor that can make you trip. What can I do in the bedroom?  Use night lights.  Make sure that you have a light by your bed that is easy to reach.  Do not use any sheets or blankets that are too big for your bed. They should not hang down onto the floor.  Have a firm chair that has side arms. You can use this for support while you get dressed.  Do not have throw rugs and other things on the floor that can make you trip. What can I do in the kitchen?  Clean up any spills right away.  Avoid walking on wet floors.  Keep items that you use a lot in easy-to-reach places.  If you need to reach something above you, use a strong step stool that has a grab bar.  Keep electrical cords out of the way.  Do not use floor polish or wax that makes floors slippery. If you must use wax, use non-skid floor wax.  Do not have throw rugs and other things on the floor that can  make you trip. What can I do with my stairs?  Do not leave any items on the stairs.  Make sure that there are handrails on both sides of the stairs and use them. Fix handrails that are broken or loose. Make sure that handrails are as long as the stairways.  Check any carpeting to make sure that it is firmly attached to the stairs. Fix any carpet that is loose or worn.  Avoid having throw rugs at the top or bottom of the stairs. If you do have throw rugs, attach them to the floor with carpet tape.  Make sure that you have a light switch at the top of the stairs and the bottom of the stairs. If you do not have them, ask someone to add them for you. What else can I do to help prevent falls?  Wear shoes that:  Do not have high heels.  Have rubber bottoms.  Are comfortable and fit you well.  Are closed at the toe. Do not wear sandals.  If you use a stepladder:  Make sure that it is fully opened. Do not climb a closed  stepladder.  Make sure that both sides of the stepladder are locked into place.  Ask someone to hold it for you, if possible.  Clearly mark and make sure that you can see:  Any grab bars or handrails.  First and last steps.  Where the edge of each step is.  Use tools that help you move around (mobility aids) if they are needed. These include:  Canes.  Walkers.  Scooters.  Crutches.  Turn on the lights when you go into a dark area. Replace any light bulbs as soon as they burn out.  Set up your furniture so you have a clear path. Avoid moving your furniture around.  If any of your floors are uneven, fix them.  If there are any pets around you, be aware of where they are.  Review your medicines with your doctor. Some medicines can make you feel dizzy. This can increase your chance of falling. Ask your doctor what other things that you can do to help prevent falls. This information is not intended to replace advice given to you by your health care provider. Make sure you discuss any questions you have with your health care provider. Document Released: 04/22/2009 Document Revised: 12/02/2015 Document Reviewed: 07/31/2014 Elsevier Interactive Patient Education  2017 [employer].

## 2020-02-29 ENCOUNTER — Other Ambulatory Visit: Payer: Self-pay | Admitting: Family Medicine

## 2020-03-29 ENCOUNTER — Other Ambulatory Visit: Payer: Self-pay | Admitting: Family Medicine

## 2020-06-12 ENCOUNTER — Other Ambulatory Visit: Payer: Self-pay | Admitting: Family Medicine

## 2020-06-27 ENCOUNTER — Other Ambulatory Visit: Payer: Self-pay | Admitting: Family Medicine

## 2020-06-29 DIAGNOSIS — H5213 Myopia, bilateral: Secondary | ICD-10-CM | POA: Diagnosis not present

## 2020-06-29 DIAGNOSIS — H52223 Regular astigmatism, bilateral: Secondary | ICD-10-CM | POA: Diagnosis not present

## 2020-06-29 DIAGNOSIS — H04122 Dry eye syndrome of left lacrimal gland: Secondary | ICD-10-CM | POA: Diagnosis not present

## 2020-06-29 DIAGNOSIS — H524 Presbyopia: Secondary | ICD-10-CM | POA: Diagnosis not present

## 2020-07-06 ENCOUNTER — Other Ambulatory Visit: Payer: Self-pay | Admitting: Family Medicine

## 2020-07-16 ENCOUNTER — Other Ambulatory Visit: Payer: Self-pay

## 2020-07-16 ENCOUNTER — Encounter: Payer: Self-pay | Admitting: Physician Assistant

## 2020-07-16 ENCOUNTER — Ambulatory Visit (INDEPENDENT_AMBULATORY_CARE_PROVIDER_SITE_OTHER): Payer: Medicare Other | Admitting: Physician Assistant

## 2020-07-16 DIAGNOSIS — Z85828 Personal history of other malignant neoplasm of skin: Secondary | ICD-10-CM | POA: Diagnosis not present

## 2020-07-16 DIAGNOSIS — Z1283 Encounter for screening for malignant neoplasm of skin: Secondary | ICD-10-CM | POA: Diagnosis not present

## 2020-07-16 DIAGNOSIS — Z87898 Personal history of other specified conditions: Secondary | ICD-10-CM

## 2020-07-16 DIAGNOSIS — Z86018 Personal history of other benign neoplasm: Secondary | ICD-10-CM | POA: Diagnosis not present

## 2020-07-16 DIAGNOSIS — B353 Tinea pedis: Secondary | ICD-10-CM

## 2020-07-16 MED ORDER — KETOCONAZOLE 2 % EX CREA: 1.0000 "application " | TOPICAL_CREAM | Freq: Two times a day (BID) | CUTANEOUS | 1 refills | Status: DC

## 2020-07-20 ENCOUNTER — Telehealth: Payer: Self-pay | Admitting: Physician Assistant

## 2020-07-20 MED ORDER — CLOBETASOL PROPIONATE 0.05 % EX FOAM
Freq: Two times a day (BID) | CUTANEOUS | 3 refills | Status: DC
Start: 2020-07-20 — End: 2020-07-21

## 2020-07-20 NOTE — Telephone Encounter (Signed)
Patient left VM asking for a refill on Clobetasol foam (per pharmacy they can not send electronic refill on RX that has expired) He would like for this refill to be sent to [facility] on [address] He was just seen on 07/16/2020.

## 2020-07-21 ENCOUNTER — Other Ambulatory Visit: Payer: Self-pay

## 2020-07-21 MED ORDER — CLOBETASOL PROPIONATE 0.05 % EX FOAM: Freq: Two times a day (BID) | CUTANEOUS | 11 refills | Status: DC

## 2020-07-22 DIAGNOSIS — H5213 Myopia, bilateral: Secondary | ICD-10-CM | POA: Diagnosis not present

## 2020-07-22 DIAGNOSIS — H52223 Regular astigmatism, bilateral: Secondary | ICD-10-CM | POA: Diagnosis not present

## 2020-07-22 DIAGNOSIS — H524 Presbyopia: Secondary | ICD-10-CM | POA: Diagnosis not present

## 2020-07-22 DIAGNOSIS — H40019 Open angle with borderline findings, low risk, unspecified eye: Secondary | ICD-10-CM | POA: Diagnosis not present

## 2020-07-23 ENCOUNTER — Encounter: Payer: Self-pay | Admitting: Physician Assistant

## 2020-07-23 NOTE — Progress Notes (Signed)
   Follow-Up Visit   Subjective  Samuel [name] is a 70 y.o. male who presents for the following: Annual Exam (Skin ckeck, rash on left foot mostly gone now x 2 months).   The following portions of the chart were reviewed this encounter and updated as appropriate:      Objective  Well appearing patient in no apparent distress; mood and affect are within normal limits.  A full examination was performed including scalp, head, eyes, ears, nose, lips, neck, chest, axillae, abdomen, back, buttocks, bilateral upper extremities, bilateral lower extremities, hands, feet, fingers, toes, fingernails, and toenails. All findings within normal limits unless otherwise noted below.  Objective  Chest - Medial [facility]): Scar clear  Objective  Chest - Medial [facility]): Dyspigmented scar.   Objective  Chest - Medial [facility]): No atypical nevi No signs of non-mole skin cancer.   Objective  Right Foot - Anterior: Scaling and maceration between toes and over distal and lateral soles.   Objective  Left Foot - Anterior: Scaling and maceration between toes and over distal and lateral soles.    Assessment & Plan  History of basal cell cancer Chest - Medial (Center)  observe  History of atypical nevus Chest - Medial (Center)  observe  Screening exam for skin cancer Chest - Medial (Center)  Yearly skin exams  Tinea pedis of right foot Right Foot - Anterior  ketoconazole (NIZORAL) 2 % cream - Right Foot - Anterior  Tinea pedis of left foot Left Foot - Anterior  ketoconazole (NIZORAL) 2 % cream - Left Foot - Anterior    I, Damiya Sandefur, PA-C, have reviewed all documentation's for this visit.  The documentation on 07/23/20 for the exam, diagnosis, procedures and orders are all accurate and complete.

## 2020-09-10 ENCOUNTER — Other Ambulatory Visit: Payer: Self-pay | Admitting: Family Medicine

## 2020-09-18 ENCOUNTER — Other Ambulatory Visit (INDEPENDENT_AMBULATORY_CARE_PROVIDER_SITE_OTHER): Payer: Self-pay | Admitting: Ophthalmology

## 2020-09-22 ENCOUNTER — Telehealth: Payer: Self-pay | Admitting: Physician Assistant

## 2020-09-22 MED ORDER — ALCLOMETASONE DIPROPIONATE 0.05 % EX CREA: TOPICAL_CREAM | Freq: Two times a day (BID) | CUTANEOUS | 3 refills | Status: AC | PRN

## 2020-09-22 NOTE — Telephone Encounter (Signed)
Patient left message on office voice mail that he wanted to get a prescription refill for Alclometasone Dipropionate Cream 0.05 % 60 grams called in to [facility] at the corner of [address].

## 2020-09-22 NOTE — Telephone Encounter (Signed)
Phone call patient needed refill on Alclometasone Cream - sent to patient pharmacy.

## 2020-10-05 ENCOUNTER — Other Ambulatory Visit: Payer: Self-pay | Admitting: Family Medicine

## 2020-10-19 ENCOUNTER — Other Ambulatory Visit: Payer: Self-pay | Admitting: Family Medicine

## 2020-10-24 ENCOUNTER — Other Ambulatory Visit: Payer: Self-pay | Admitting: Family Medicine

## 2020-10-29 ENCOUNTER — Ambulatory Visit (INDEPENDENT_AMBULATORY_CARE_PROVIDER_SITE_OTHER): Payer: Medicare Other | Admitting: Family Medicine

## 2020-10-29 ENCOUNTER — Other Ambulatory Visit: Payer: Self-pay

## 2020-10-29 ENCOUNTER — Ambulatory Visit (INDEPENDENT_AMBULATORY_CARE_PROVIDER_SITE_OTHER): Payer: Medicare Other

## 2020-10-29 ENCOUNTER — Encounter: Payer: Self-pay | Admitting: Family Medicine

## 2020-10-29 VITALS — BP 140/80 | HR 73 | Temp 98.0°F | Wt 275.0 lb

## 2020-10-29 DIAGNOSIS — Z23 Encounter for immunization: Secondary | ICD-10-CM | POA: Diagnosis not present

## 2020-10-29 DIAGNOSIS — I517 Cardiomegaly: Secondary | ICD-10-CM | POA: Diagnosis not present

## 2020-10-29 DIAGNOSIS — E785 Hyperlipidemia, unspecified: Secondary | ICD-10-CM | POA: Diagnosis not present

## 2020-10-29 DIAGNOSIS — Z125 Encounter for screening for malignant neoplasm of prostate: Secondary | ICD-10-CM | POA: Diagnosis not present

## 2020-10-29 DIAGNOSIS — I10 Essential (primary) hypertension: Secondary | ICD-10-CM | POA: Diagnosis not present

## 2020-10-29 DIAGNOSIS — Z8249 Family history of ischemic heart disease and other diseases of the circulatory system: Secondary | ICD-10-CM | POA: Diagnosis not present

## 2020-10-29 DIAGNOSIS — M25551 Pain in right hip: Secondary | ICD-10-CM

## 2020-10-29 DIAGNOSIS — R7303 Prediabetes: Secondary | ICD-10-CM

## 2020-10-29 LAB — BASIC METABOLIC PANEL
BUN: 23 mg/dL (ref 6–23)
CO2: 30 mEq/L (ref 19–32)
Calcium: 9.8 mg/dL (ref 8.4–10.5)
Chloride: 101 mEq/L (ref 96–112)
Creatinine, Ser: 1.1 mg/dL (ref 0.40–1.50)
GFR: 69.33 mL/min (ref >60.00)
Glucose, Bld: 115 mg/dL — ABNORMAL HIGH (ref 70–99)
Potassium: 4.4 mEq/L (ref 3.5–5.1)
Sodium: 141 mEq/L (ref 135–145)

## 2020-10-29 LAB — HEPATIC FUNCTION PANEL
ALT: 40 U/L (ref 0–53)
AST: 31 U/L (ref 0–37)
Albumin: 4.2 g/dL (ref 3.5–5.2)
Alkaline Phosphatase: 53 U/L (ref 39–117)
Bilirubin, Direct: 0.2 mg/dL (ref 0.0–0.3)
Total Bilirubin: 0.8 mg/dL (ref 0.2–1.2)
Total Protein: 7 g/dL (ref 6.0–8.3)

## 2020-10-29 LAB — HEMOGLOBIN A1C: Hgb A1c MFr Bld: 5.8 % (ref 4.6–6.5)

## 2020-10-29 LAB — LIPID PANEL
Cholesterol: 172 mg/dL (ref 0–200)
HDL: 39.6 mg/dL (ref >39.00)
LDL Cholesterol: 109 mg/dL — ABNORMAL HIGH (ref 0–99)
NonHDL: 132.71
Total CHOL/HDL Ratio: 4
Triglycerides: 120 mg/dL (ref 0.0–149.0)
VLDL: 24 mg/dL (ref 0.0–40.0)

## 2020-10-29 LAB — PSA, MEDICARE: PSA: 1.42 ng/ml (ref 0.10–4.00)

## 2020-10-29 MED ORDER — METOPROLOL TARTRATE 50 MG PO TABS: ORAL_TABLET | ORAL | 3 refills | Status: DC

## 2020-10-29 MED ORDER — HYDROCHLOROTHIAZIDE 25 MG PO TABS: ORAL_TABLET | ORAL | 3 refills | Status: DC

## 2020-10-29 MED ORDER — LISINOPRIL 20 MG PO TABS: ORAL_TABLET | ORAL | 3 refills | Status: DC

## 2020-10-29 NOTE — Patient Instructions (Signed)
Coronary Calcium Scan A coronary calcium scan is an imaging test used to look for deposits of plaque in the inner lining of the blood vessels of the heart (coronary arteries). Plaque is made up of calcium, protein, and fatty substances. These deposits of plaque can partly clog and narrow the coronary arteries without producing any symptoms or warning signs. This puts a person at risk for a heart attack. This test is recommended for people who are at moderate risk for heart disease. The test can find plaque deposits before symptoms develop. Tell a health care provider about:  Any allergies you have.  All medicines you are taking, including vitamins, herbs, eye drops, creams, and over-the-counter medicines.  Any problems you or family members have had with anesthetic medicines.  Any blood disorders you have.  Any surgeries you have had.  Any medical conditions you have.  Whether you are pregnant or may be pregnant. What are the risks? Generally, this is a safe procedure. However, problems may occur, including:  Harm to a pregnant woman and her unborn baby. This test involves the use of radiation. Radiation exposure can be dangerous to a pregnant woman and her unborn baby. If you are pregnant or think you may be pregnant, you should not have this procedure done.  Slight increase in the risk of cancer. This is because of the radiation involved in the test. What happens before the procedure? Ask your health care provider for any specific instructions on how to prepare for this procedure. You may be asked to avoid products that contain caffeine, tobacco, or nicotine for 4 hours before the procedure. What happens during the procedure?  You will undress and remove any jewelry from your neck or chest.  You will put on a hospital gown.  Sticky electrodes will be placed on your chest. The electrodes will be connected to an electrocardiogram (ECG) machine to record a tracing of the electrical  activity of your heart.  You will lie down on a curved bed that is attached to the CT scanner.  You may be given medicine to slow down your heart rate so that clear pictures can be created.  You will be moved into the CT scanner, and the CT scanner will take pictures of your heart. During this time, you will be asked to lie still and hold your breath for 2-3 seconds at a time while each picture of your heart is being taken. The procedure may vary among health care providers and hospitals.   What happens after the procedure?  You can get dressed.  You can return to your normal activities.  It is up to you to get the results of your procedure. Ask your health care provider, or the department that is doing the procedure, when your results will be ready. Summary  A coronary calcium scan is an imaging test used to look for deposits of plaque in the inner lining of the blood vessels of the heart (coronary arteries). Plaque is made up of calcium, protein, and fatty substances.  Generally, this is a safe procedure. Tell your health care provider if you are pregnant or may be pregnant.  Ask your health care provider for any specific instructions on how to prepare for this procedure.  A CT scanner will take pictures of your heart.  You can return to your normal activities after the scan is done. This information is not intended to replace advice given to you by your health care provider. Make sure you discuss   any questions you have with your health care provider. Document Revised: 01/14/2019 Document Reviewed: 01/14/2019 Elsevier Patient Education  2021 Elsevier Inc.  

## 2020-10-29 NOTE — Progress Notes (Signed)
Established Patient Office Visit  Subjective:  Patient ID: Samuel [name], male    DOB: 09-27-1950  Age: 70 y.o. MRN: [id number]  CC:  Chief Complaint  Patient presents with  . Annual Exam    No new concerns    HPI Samuel [name] presents for annual follow-up exam.  He has history of obesity, hypertension, history of LVH, prediabetes, mild hyperlipidemia.  Needs refills of several medications including metoprolol, lisinopril, HCTZ.  He does not check blood pressures regularly.  No regular exercise.  Thinks he may have gout.  Has had a couple episodes of foot pain but not per se in the joints.  He went to urgent care and was given colchicine which may have helped.  His recent issue is 2-week history of right hip pain.  Radiating toward the right groin.  No injury.  Does have some stiffness in the right hip.  Pain is actually somewhat better today.  No low back pain.  No claudication symptoms.  Family history reviewed.  His father died of coronary disease age 69.  He had a grandfather that died in his early to mid 82s of CAD.  Past Medical History:  Diagnosis Date  . ABNORMAL ELECTROCARDIOGRAM 12/21/2009  . 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 Atypical nevus 07/18/2011   mild-mod-right upper abdomen  . Basal cell carcinoma 07/17/2019   nod-left forearm-(CX35FU)  . EPISTAXIS, RECURRENT 12/21/2009  . ESSENTIAL HYPERTENSION 12/21/2009  . Retinal detachment    OD  . VENTRICULAR HYPERTROPHY, LEFT 01/04/2010    Past Surgical History:  Procedure Laterality Date  . CATARACT EXTRACTION Bilateral 2009  . EYE SURGERY Bilateral 2009   cataract  . GAS/FLUID EXCHANGE Right 09/26/2018   Procedure: Gas/Fluid Exchange;  Surgeon: Bernarda Caffey, MD;  Location: [facility];  Service: Ophthalmology;  Laterality: Right;  . PHOTOCOAGULATION WITH LASER Right 09/26/2018   Procedure: Photocoagulation With Laser;  Surgeon: Bernarda Caffey, MD;  Location: [facility];  Service:  Ophthalmology;  Laterality: Right;  . SCLERAL BUCKLE Right 09/26/2018   Procedure: Scleral Buckle;  Surgeon: Bernarda Caffey, MD;  Location: [facility];  Service: Ophthalmology;  Laterality: Right;  . VITRECTOMY 25 GAUGE WITH SCLERAL BUCKLE Right 09/26/2018   Procedure: VITRECTOMY 25 GAUGE;  Surgeon: Bernarda Caffey, MD;  Location: [facility];  Service: Ophthalmology;  Laterality: Right;    Family History  Problem Relation Age of Onset  . Cancer Mother        bladder CA  . Hypertension Mother   . Hypertension Father   . Heart disease Father   . Heart disease Maternal Grandfather   . Hypertension Maternal Grandfather   . Cancer Paternal Grandmother        ovarian CA  . Heart disease Paternal Grandfather   . Hypertension Paternal Grandfather     Social History   Socioeconomic History  . Marital status: Married    Spouse name: Not on file  . Number of children: Not on file  . Years of education: Not on file  . Highest education level: Not on file  Occupational History  . Occupation: Insuarnce agent  Tobacco Use  . Smoking status: Light Tobacco Smoker    Packs/day: 1.00    Years: 18.00    Pack years: 18.00    Types: Cigars    Last attempt to quit: 08/22/1988    Years since quitting: 32.2  . Smokeless tobacco: Never Used  Vaping Use  . Vaping Use: Never used  Substance and Sexual Activity  . Alcohol use: Yes    Comment: social  . Drug use: Never  . Sexual activity: Not on file  Other Topics Concern  . Not on file  Social History Narrative  . Not on file   Social Determinants of Health   Financial Resource Strain: Not on file  Food Insecurity: Not on file  Transportation Needs: Not on file  Physical Activity: Not on file  Stress: Not on file  Social Connections: Not on file  Intimate Partner Violence: Not on file    Outpatient Medications Prior to Visit  Medication Sig Dispense Refill  . alclomethasone (ACLOVATE) 0.05 % cream Apply topically 2 (two) times daily as needed  (Rash). 180 g 3  . atropine 1 % ophthalmic solution Place 1 drop into the right eye 2 (two) times daily.    . bacitracin-polymyxin b (POLYSPORIN) ophthalmic ointment Place 1 application into the right eye at bedtime. apply to eye every 12 hours while awake    . clobetasol (OLUX) 0.05 % topical foam Apply topically 2 (two) times daily. 100 g 11  . colchicine 0.6 MG tablet In event of gout flare, take 2 pills, then 1 more pill an hour after 12 tablet 0  . dorzolamide-timolol (COSOPT) 22.3-6.8 MG/ML ophthalmic solution INSTILL 1 DROP IN EACH EYE ONCE DAILY 10 mL 9  . ketoconazole (NIZORAL) 2 % cream Apply 1 application topically in the morning and at bedtime. 60 g 1  . prednisoLONE acetate (PRED FORTE) 1 % ophthalmic suspension Place 1 drop into the right eye 6 (six) times daily. 15 mL 1  . sildenafil (VIAGRA) 100 MG tablet TAKE 1/2 TO 1 TABLET BY MOUTH DAILY AS NEEDED FOR ERECTILE DYSFUNCTION 10 tablet 5  . hydrochlorothiazide (HYDRODIURIL) 25 MG tablet TAKE 1 TABLET(25 MG) BY MOUTH DAILY 90 tablet 0  . lisinopril (ZESTRIL) 20 MG tablet TAKE 1 TABLET(20 MG) BY MOUTH DAILY 90 tablet 0  . metoprolol tartrate (LOPRESSOR) 50 MG tablet TAKE 1 TABLET(50 MG) BY MOUTH DAILY 90 tablet 1   No facility-administered medications prior to visit.    Allergies  Allergen Reactions  . Penicillins     REACTION: hives    ROS Review of Systems  Constitutional: Negative for fatigue and unexpected weight change.  Eyes: Negative for visual disturbance.  Respiratory: Negative for cough, chest tightness and shortness of breath.   Cardiovascular: Negative for chest pain, palpitations and leg swelling.  Gastrointestinal: Negative for abdominal pain.  Genitourinary: Negative for dysuria.  Musculoskeletal: Positive for arthralgias.  Neurological: Negative for dizziness, syncope, weakness, light-headedness and headaches.      Objective:    Physical Exam Constitutional:      Appearance: He is well-developed.   HENT:     Right Ear: External ear normal.     Left Ear: External ear normal.  Eyes:     Pupils: Pupils are equal, round, and reactive to light.  Neck:     Thyroid: No thyromegaly.  Cardiovascular:     Rate and Rhythm: Normal rate and regular rhythm.  Pulmonary:     Effort: Pulmonary effort is normal. No respiratory distress.     Breath sounds: Normal breath sounds. No wheezing or rales.  Musculoskeletal:     Cervical back: Neck supple.     Right lower leg: No edema.     Left lower leg: No edema.     Comments: He does have some restriction of range of motion especially with internal rotation on  the right side compared to the left  Neurological:     Mental Status: He is alert and oriented to person, place, and time.     BP 140/80 (BP Location: Left Arm, Cuff Size: Large)   Pulse 73   Temp 98 F (36.7 C) (Oral)   Wt 275 lb (124.7 kg)   SpO2 97%   BMI 39.46 kg/m  Wt Readings from Last 3 Encounters:  10/29/20 275 lb (124.7 kg)  11/04/19 271 lb 14.4 oz (123.3 kg)  09/26/18 270 lb (122.5 kg)     Health Maintenance Due  Topic Date Due  . COVID-19 Vaccine (3 - Moderna risk 4-dose series) 11/11/2019  . TETANUS/TDAP  02/19/2020    There are no preventive care reminders to display for this patient.  Lab Results  Component Value Date   TSH 1.89 01/09/2017   Lab Results  Component Value Date   WBC 8.9 09/26/2018   HGB 16.2 09/26/2018   HCT 50.9 09/26/2018   MCV 96.0 09/26/2018   PLT 274 09/26/2018   Lab Results  Component Value Date   NA 137 11/04/2019   K 4.1 11/04/2019   CO2 29 11/04/2019   GLUCOSE 141 (H) 11/04/2019   BUN 23 11/04/2019   CREATININE 1.18 11/04/2019   BILITOT 0.8 01/09/2017   ALKPHOS 47 01/09/2017   AST 25 01/09/2017   ALT 31 01/09/2017   PROT 6.9 01/09/2017   ALBUMIN 4.2 01/09/2017   CALCIUM 9.8 11/04/2019   ANIONGAP 8 09/26/2018   GFR 61.61 11/04/2019   Lab Results  Component Value Date   CHOL 162 01/09/2017   Lab Results   Component Value Date   HDL 42.70 01/09/2017   Lab Results  Component Value Date   LDLCALC 97 01/09/2017   Lab Results  Component Value Date   TRIG 111.0 01/09/2017   Lab Results  Component Value Date   CHOLHDL 4 01/09/2017   Lab Results  Component Value Date   HGBA1C 5.9 11/04/2019      Assessment & Plan:   Problem List Items Addressed This Visit      Unprioritized   Essential hypertension - Primary   Relevant Medications   metoprolol tartrate (LOPRESSOR) 50 MG tablet   lisinopril (ZESTRIL) 20 MG tablet   hydrochlorothiazide (HYDRODIURIL) 25 MG tablet   Other Relevant Orders   Basic metabolic panel   Prediabetes   Relevant Orders   Lipid panel   Hepatic function panel   Hemoglobin A1c    Other Visit Diagnoses    Prostate cancer screening       Relevant Orders   PSA, Medicare   Family history of early CAD       Relevant Orders   Lipid panel   LVH (left ventricular hypertrophy)       Relevant Medications   metoprolol tartrate (LOPRESSOR) 50 MG tablet   lisinopril (ZESTRIL) 20 MG tablet   hydrochlorothiazide (HYDRODIURIL) 25 MG tablet   Other Relevant Orders   Lipid panel   Hyperlipidemia, unspecified hyperlipidemia type       Relevant Medications   metoprolol tartrate (LOPRESSOR) 50 MG tablet   lisinopril (ZESTRIL) 20 MG tablet   hydrochlorothiazide (HYDRODIURIL) 25 MG tablet   Other Relevant Orders   Lipid panel   Right hip pain       Relevant Orders   DG Hip Unilat W OR W/O Pelvis 2-3 Views Right   Need for pneumococcal vaccination       Relevant Orders  Pneumococcal polysaccharide vaccine 23-valent greater than or equal to 2yo subcutaneous/IM (Completed)    -Patient has hypertension with borderline control today.  We discussed additional medication versus lifestyle modification he would like to try the latter.  Strongly advocated losing some weight and try to establish more consistent exercise.  Monitor blood pressure and be in touch if  consistently greater than 140/90  -We will obtain x-rays right hip.  Suspect he has some osteoarthritis.  Avoid regular use of non-steroidals  He has family history of CAD as above.  We discussed further risk ratification with coronary calcium score and he would like to proceed in that direction.  He is currently on a statin and will certainly consider statin especially if he has high coronary calcium score  -Needs Pneumovax and this was given today.  He plans to consider repeat Cologuard in a year.  Continue with annual flu vaccine.  -We discussed the fact that if he continues to have gout flareups would consider discontinuation of HCTZ and also getting baseline uric acid level.  He declines at this time.  Also discussed dietary factors.  Meds ordered this encounter  Medications  . metoprolol tartrate (LOPRESSOR) 50 MG tablet    Sig: TAKE 1 TABLET(50 MG) BY MOUTH DAILY    Dispense:  90 tablet    Refill:  3    ZERO refills remain on this prescription. Your patient is requesting advance approval of refills for this medication to [facility]  . lisinopril (ZESTRIL) 20 MG tablet    Sig: TAKE 1 TABLET(20 MG) BY MOUTH DAILY    Dispense:  90 tablet    Refill:  3    Patient needs an appointment for further refills.  . hydrochlorothiazide (HYDRODIURIL) 25 MG tablet    Sig: TAKE 1 TABLET(25 MG) BY MOUTH DAILY    Dispense:  90 tablet    Refill:  3    Patient needs a physical for further refills.    Follow-up: No follow-ups on file.    Carolann Littler, MD

## 2020-11-03 ENCOUNTER — Encounter: Payer: Self-pay | Admitting: Family Medicine

## 2020-11-03 DIAGNOSIS — M1611 Unilateral primary osteoarthritis, right hip: Secondary | ICD-10-CM

## 2020-11-04 NOTE — Telephone Encounter (Signed)
Please advise 

## 2020-11-11 NOTE — Telephone Encounter (Signed)
Handicap form is in Dr. Anastasio Auerbach' red folder.

## 2020-11-11 NOTE — Telephone Encounter (Signed)
Per Dr. Elease Hashimoto, "OK to get form for handicap sticker and I will complete.  Go ahead and place referral to Dr Rolena Infante with [facility] for osteoarthritis of right hip (although I am not sure if he does hip surgery).  Their office will have to clarify for [place]."

## 2020-11-23 ENCOUNTER — Encounter: Payer: Self-pay | Admitting: Physician Assistant

## 2020-11-23 ENCOUNTER — Other Ambulatory Visit: Payer: Self-pay

## 2020-11-23 ENCOUNTER — Ambulatory Visit (INDEPENDENT_AMBULATORY_CARE_PROVIDER_SITE_OTHER): Payer: Medicare Other | Admitting: Physician Assistant

## 2020-11-23 DIAGNOSIS — L821 Other seborrheic keratosis: Secondary | ICD-10-CM

## 2020-11-23 DIAGNOSIS — L814 Other melanin hyperpigmentation: Secondary | ICD-10-CM | POA: Diagnosis not present

## 2020-11-23 DIAGNOSIS — M25551 Pain in right hip: Secondary | ICD-10-CM | POA: Diagnosis not present

## 2020-11-23 DIAGNOSIS — L578 Other skin changes due to chronic exposure to nonionizing radiation: Secondary | ICD-10-CM | POA: Diagnosis not present

## 2020-11-23 DIAGNOSIS — Z1283 Encounter for screening for malignant neoplasm of skin: Secondary | ICD-10-CM

## 2020-11-23 DIAGNOSIS — D1800 Hemangioma unspecified site: Secondary | ICD-10-CM | POA: Diagnosis not present

## 2020-11-23 DIAGNOSIS — M5459 Other low back pain: Secondary | ICD-10-CM | POA: Diagnosis not present

## 2020-11-23 NOTE — Patient Instructions (Signed)
Seborrheic Keratosis A seborrheic keratosis is a common, noncancerous (benign) skin growth. These growths are velvety, waxy, rough, tan, brown, or black spots that appear on the skin. These skin growths can be flat or raised, and scaly. What are the causes? The cause of this condition is not known. What increases the risk? You are more likely to develop this condition if you:  Have a family history of seborrheic keratosis.  Are 50 or older.  Are pregnant.  Have had estrogen replacement therapy. What are the signs or symptoms? Symptoms of this condition include growths on the face, chest, shoulders, back, or other areas. These growths:  Are usually painless, but may become irritated and itchy.  Can be yellow, brown, black, or other colors.  Are slightly raised or have a flat surface.  Are sometimes rough or wart-like in texture.  Are often velvety or waxy on the surface.  Are round or oval-shaped.  Often occur in groups, but may occur as a single growth.   How is this diagnosed? This condition is diagnosed with a medical history and physical exam.  A sample of the growth may be tested (skin biopsy).  You may need to see a skin specialist (dermatologist). How is this treated? Treatment is not usually needed for this condition, unless the growths are irritated or bleed often.  You may also choose to have the growths removed if you do not like their appearance. ? Most commonly, these growths are treated with a procedure in which liquid nitrogen is applied to "freeze" off the growth (cryosurgery). ? They may also be burned off with electricity (electrocautery) or removed by scraping (curettage). Follow these instructions at home:  Watch your growth for any changes.  Keep all follow-up visits as told by your health care provider. This is important.  Do not scratch or pick at the growth or growths. This can cause them to become irritated or infected. Contact a health care  provider if:  You suddenly have many new growths.  Your growth bleeds, itches, or hurts.  Your growth suddenly becomes larger or changes color. Summary  A seborrheic keratosis is a common, noncancerous (benign) skin growth.  Treatment is not usually needed for this condition, unless the growths are irritated or bleed often.  Watch your growth for any changes.  Contact a health care provider if you suddenly have many new growths or your growth suddenly becomes larger or changes color.  Keep all follow-up visits as told by your health care provider. This is important. This information is not intended to replace advice given to you by your health care provider. Make sure you discuss any questions you have with your health care provider. Document Revised: 11/08/2017 Document Reviewed: 11/08/2017 Elsevier Patient Education  2021 Elsevier Inc.  

## 2020-11-23 NOTE — Progress Notes (Signed)
   Follow-Up Visit   Subjective  Samuel [name] is a 69 y.o. male who presents for the following: Annual Exam (Here for yearly skin exam. No new concerns. Patient does have history of Basal cell carcinomas. ).   The following portions of the chart were reviewed this encounter and updated as appropriate:  Tobacco  Allergies  Meds  Problems  Med Hx  Surg Hx  Fam Hx      Objective  Well appearing patient in no apparent distress; mood and affect are within normal limits.  All skin waist up examined.  Objective  right side, top of scalp, right forehead: No atypical nevi No signs of non-mole skin cancer.    Assessment & Plan  Seborrheic keratosis right side, top of scalp, right forehead  Recheck in 6-12 months  Lentigines - Scattered tan macules - Discussed due to sun exposure - Benign, observe - Call for any changes  Hemangiomas - Red papules - Discussed benign nature - Observe - Call for any changes  Actinic Damage - diffuse scaly erythematous macules with underlying dyspigmentation - Recommend daily broad spectrum sunscreen SPF 30+ to sun-exposed areas, reapply every 2 hours as needed.  - Call for new or changing lesions.  Skin cancer screening performed today.   I, Mischa Pollard, PA-C, have reviewed all documentation's for this visit.  The documentation on 11/23/20 for the exam, diagnosis, procedures and orders are all accurate and complete.

## 2020-11-26 ENCOUNTER — Other Ambulatory Visit: Payer: Medicare Other

## 2020-11-29 ENCOUNTER — Encounter: Payer: Self-pay | Admitting: Family Medicine

## 2020-11-30 ENCOUNTER — Ambulatory Visit (INDEPENDENT_AMBULATORY_CARE_PROVIDER_SITE_OTHER)
Admission: RE | Admit: 2020-11-30 | Discharge: 2020-11-30 | Disposition: A | Discharge status: Home / Self Care | Payer: Self-pay | Source: Ambulatory Visit | Attending: Family Medicine | Admitting: Family Medicine

## 2020-11-30 ENCOUNTER — Other Ambulatory Visit: Payer: Self-pay

## 2020-11-30 DIAGNOSIS — I10 Essential (primary) hypertension: Secondary | ICD-10-CM

## 2020-12-01 ENCOUNTER — Other Ambulatory Visit: Payer: Self-pay

## 2020-12-01 MED ORDER — ATORVASTATIN CALCIUM 20 MG PO TABS: 20.0000 mg | ORAL_TABLET | Freq: Every day | ORAL | 3 refills | Status: DC

## 2020-12-05 ENCOUNTER — Encounter: Payer: Self-pay | Admitting: Family Medicine

## 2020-12-07 DIAGNOSIS — M5459 Other low back pain: Secondary | ICD-10-CM | POA: Diagnosis not present

## 2020-12-07 DIAGNOSIS — M545 Low back pain, unspecified: Secondary | ICD-10-CM | POA: Diagnosis not present

## 2021-01-04 NOTE — Progress Notes (Signed)
[facility] Note  01/06/2021     CHIEF COMPLAINT Patient presents for Retina Follow Up   HISTORY OF PRESENT ILLNESS: Samuel [name] is a 70 y.o. male who presents to the clinic today for:   HPI     Retina Follow Up   Patient presents with  Retinal Break/Detachment.  In right eye.  This started years ago.  Severity is moderate.  Duration of 1 year.  Since onset it is stable.  I, the attending physician,  performed the HPI with the patient and updated documentation appropriately.        Comments   70 y/o male pt here for 1 yr f/u for RD OD.  S/p SBP+PPV OD 3.19.20.  No change in [place] OU.  Denies pain, new floaters.  Still has periodic temporal FOL OD.  Last occurred yesterday.  Cosopt QD OU.  AT prn OU.  [place] prn OS.      Last edited by Bernarda Caffey, MD on 01/06/2021  4:18 PM.     pt states eyes are doing great   Referring physician: Marica Otter, OD [address],  [place] [address]  HISTORICAL INFORMATION:   Selected notes from the MEDICAL RECORD NUMBER Referred by Dr. Marica Otter for concern of RD OD LEE: 3.18.20 Ammie Ferrier)  Ocular Hx- PMH-    CURRENT MEDICATIONS: Current Outpatient Medications (Ophthalmic Drugs)  Medication Sig   atropine 1 % ophthalmic solution Place 1 drop into the right eye 2 (two) times daily.   dorzolamide-timolol (COSOPT) 22.3-6.8 MG/ML ophthalmic solution INSTILL 1 DROP IN [facility] EYE ONCE DAILY   bacitracin-polymyxin b (POLYSPORIN) ophthalmic ointment Place 1 application into the right eye at bedtime. apply to eye every 12 hours while awake (Patient not taking: Reported on 01/06/2021)   prednisoLONE acetate (PRED FORTE) 1 % ophthalmic suspension Place 1 drop into the right eye 6 (six) times daily. (Patient not taking: Reported on 01/06/2021)   No current facility-administered medications for this visit. (Ophthalmic Drugs)   Current Outpatient Medications (Other)  Medication Sig    alclomethasone (ACLOVATE) 0.05 % cream Apply topically 2 (two) times daily as needed (Rash).   atorvastatin (LIPITOR) 20 MG tablet Take 1 tablet (20 mg total) by mouth daily.   clobetasol (OLUX) 0.05 % topical foam Apply topically 2 (two) times daily.   colchicine 0.6 MG tablet In event of gout flare, take 2 pills, then 1 more pill an hour after   hydrochlorothiazide (HYDRODIURIL) 25 MG tablet TAKE 1 TABLET(25 MG) BY MOUTH DAILY   ketoconazole (NIZORAL) 2 % cream Apply 1 application topically in the morning and at bedtime.   lisinopril (ZESTRIL) 20 MG tablet TAKE 1 TABLET(20 MG) BY MOUTH DAILY   metoprolol tartrate (LOPRESSOR) 50 MG tablet TAKE 1 TABLET(50 MG) BY MOUTH DAILY   predniSONE (DELTASONE) 10 MG tablet Take by mouth.   sildenafil (VIAGRA) 100 MG tablet TAKE 1/2 TO 1 TABLET BY MOUTH DAILY AS NEEDED FOR ERECTILE DYSFUNCTION   No current facility-administered medications for this visit. (Other)      REVIEW OF SYSTEMS: ROS   Positive for: Eyes Negative for: Constitutional, Gastrointestinal, Neurological, Skin, Genitourinary, Musculoskeletal, HENT, Endocrine, Cardiovascular, Respiratory, Psychiatric, Allergic/Imm, Heme/Lymph Last edited by Matthew Folks, COA on 01/06/2021  9:03 AM.        ALLERGIES Allergies  Allergen Reactions   Penicillins     REACTION: hives    PAST MEDICAL HISTORY Past Medical History:  Diagnosis  Date   ABNORMAL ELECTROCARDIOGRAM 12/21/2009   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   severe- right anterior shoulder- (EXC)   Atypical nevus 07/18/2011   mild-mod-right upper abdomen   Basal cell carcinoma 07/17/2019   nod-left forearm-(CX35FU)   EPISTAXIS, RECURRENT 12/21/2009   ESSENTIAL HYPERTENSION 12/21/2009   Retinal detachment    OD   VENTRICULAR HYPERTROPHY, LEFT 01/04/2010   Past Surgical History:  Procedure Laterality Date   CATARACT EXTRACTION Bilateral 2009   EYE SURGERY Bilateral 2009   cataract   GAS/FLUID EXCHANGE Right 09/26/2018    Procedure: Gas/Fluid Exchange;  Surgeon: Bernarda Caffey, MD;  Location: [place];  Service: Ophthalmology;  Laterality: Right;   PHOTOCOAGULATION WITH LASER Right 09/26/2018   Procedure: Photocoagulation With Laser;  Surgeon: Bernarda Caffey, MD;  Location: [place];  Service: Ophthalmology;  Laterality: Right;   SCLERAL BUCKLE Right 09/26/2018   Procedure: Scleral Buckle;  Surgeon: Bernarda Caffey, MD;  Location: [place];  Service: Ophthalmology;  Laterality: Right;   VITRECTOMY 25 GAUGE WITH SCLERAL BUCKLE Right 09/26/2018   Procedure: VITRECTOMY 25 GAUGE;  Surgeon: Bernarda Caffey, MD;  Location: [place];  Service: Ophthalmology;  Laterality: Right;    FAMILY HISTORY Family History  Problem Relation Age of Onset   Cancer Mother        bladder CA   Hypertension Mother    Hypertension Father    Heart disease Father    Heart disease Maternal Grandfather    Hypertension Maternal Grandfather    Cancer Paternal Grandmother        ovarian CA   Heart disease Paternal Grandfather    Hypertension Paternal Grandfather     SOCIAL HISTORY Social History   Tobacco Use   Smoking status: Light Smoker    Packs/day: 1.00    Years: 18.00    Pack years: 18.00    Types: Cigars, Cigarettes    Last attempt to quit: 08/22/1988    Years since quitting: 32.3   Smokeless tobacco: Never  Vaping Use   Vaping Use: Never used  Substance Use Topics   Alcohol use: Yes    Comment: social   Drug use: Never         OPHTHALMIC EXAM:  Base Eye Exam     Visual Acuity (Snellen - Linear)       Right Left   Dist [place]  20/20 -2   Dist cc 20/25    Dist ph cc NI     Correction: Contacts         Tonometry (Tonopen, 9:09 AM)       Right Left   Pressure 12 13         Pupils       Dark Light Shape React APD   Right 3 2 Round Minimal None   Left 3 2 Round Minimal None         Visual Fields (Counting fingers)       Left Right    Full Full         Extraocular Movement       Right Left    Full,  Ortho Full, Ortho         Neuro/Psych     Oriented x3: Yes   Mood/Affect: Normal         Dilation     Both eyes: 1.0% Mydriacyl, 2.5% Phenylephrine @ 9:09 AM           Slit Lamp and Fundus Exam     Slit Lamp Exam  Right Left   Lids/Lashes Dermatochalasis - upper lid Dermatochalasis - upper lid   Conjunctiva/Sclera White and quiet White and quiet   Cornea 1-2+ PEE, inferior, paracentral corneal haze, mild tear film debris mild Arcus, 2+ inferior Punctate epithelial erosions   Anterior Chamber Deep and quiet Deep and quiet   Iris Round and dilated Round and dilated   Lens 3 piece sulcus IOL in good position, open PC 3 piece IOL, open PC, ?sulcus   Vitreous post vitrectomy, clear Vitreous syneresis, vitreous condensations, Posterior vitreous detachment         Fundus Exam       Right Left   Disc Pink and Sharp Pink and Sharp, mild tilt   C/D Ratio 0.4 0.4   Macula Flat, blunted foveal reflex, RPE mottling, focal, nasal ERM - stable from prior, no heme or edema Flat, Good foveal reflex, Retinal pigment epithelial mottling, trace Epiretinal membrane   Vessels mild attenuation, mild tortuousity Mild Vascular attenuation, mild Tortuousity, +AV crossing changes   Periphery retina attached over buckle, good buckle height, good laser over buckle; Original detachment: bullous inferior detachment from 0230-0930 Attached, peripheral cystoid degeneration, no RT/RD             IMAGING AND PROCEDURES  Imaging and Procedures for _0 @  OCT, Retina - OU - Both Eyes       Right Eye Quality was good. Central Foveal Thickness: 323. Progression has been stable. Findings include normal foveal contour, no SRF, no IRF, epiretinal membrane, outer retinal atrophy (Retina re-attached, macula re-attached, good foveal depression, focal ERM nasal macula).   Left Eye Quality was good. Central Foveal Thickness: 304. Progression has been stable. Findings include normal foveal  contour, no SRF, no IRF (Trace ERM).   Notes *Images captured and stored on drive  Diagnosis / Impression:  OD: NFP, no IRF/SRF, Retina re-attached, macula re-attached, good foveal depression, focal ERM nasal macula OS: NFP, no IRF/SRF   Clinical management:  See below  Abbreviations: NFP - Normal foveal profile. CME - cystoid macular edema. PED - pigment epithelial detachment. IRF - intraretinal fluid. SRF - subretinal fluid. EZ - ellipsoid zone. ERM - epiretinal membrane. ORA - outer retinal atrophy. ORT - outer retinal tubulation. SRHM - subretinal hyper-reflective material               ASSESSMENT/PLAN:    ICD-10-CM   1. Right retinal detachment  H33.21     2. Retinal edema  H35.81 OCT, Retina - OU - Both Eyes    3. Epiretinal membrane (ERM) of left eye  H35.372     4. 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 Rhegmatogenous retinal detachment, RIGHT EYE  - bullous inferior mac off detachment, 0230-0930, fovea off  - onset of foveal involvement Wednesday, 03.18.20 by history  - s/p SBP + PPV/PFC/EL/FAX/14% C3F8 OD, 03.19.2020             - retina attached and in good position -- good buckle height and laser around breaks  - BCVA: 20/25-2 (MRx)  - cont Cosopt Qdaily OU  - cont Artificial tears QID PRN OD  - f/u 25yr 3. Epiretinal membrane, OS   - mild ERM  - asymptomatic, no metamorphopsia  - no indication for surgery at this time  - monitor for now  4. Pseudophakia OU  - s/p CE/IOL - Dr. EDolores Lory(2010)  - sulcus IOLs  -  monitor   5. Ocular hypertension OU  - IOP 12,13 and stably good on Cosopt qd OU   Ophthalmic Meds Ordered this visit:  No orders of the defined types were placed in this encounter.      Return in about 1 year (around 01/06/2022) for f/u RD OD, DFE, OCT.  There are no Patient Instructions on file for this visit.   Explained the diagnoses, plan, and follow up with the patient and  they expressed understanding.  Patient expressed understanding of the importance of proper follow up care.   This document serves as a record of services personally performed by Gardiner Sleeper, MD, PhD. It was created on their behalf by Leonie Douglas, an ophthalmic technician. The creation of this record is the provider's dictation and/or activities during the visit.    Electronically signed by: Leonie Douglas COA, 01/06/21  4:20 PM  This document serves as a record of services personally performed by Gardiner Sleeper, MD, PhD. It was created on their behalf by San Jetty. Owens Shark, OA an ophthalmic technician. The creation of this record is the provider's dictation and/or activities during the visit.    Electronically signed by: San Jetty. Owens Shark, [place] 06.30.2022 4:20 PM  Gardiner Sleeper, M.D., Ph.D. Diseases & Surgery of the Retina and [place] 01/06/2021  I have reviewed the above documentation for accuracy and completeness, and I agree with the above. Gardiner Sleeper, M.D., Ph.D. 01/06/21 4:20 PM   Abbreviations: M myopia (nearsighted); A astigmatism; H hyperopia (farsighted); P presbyopia; Mrx spectacle prescription;  CTL contact lenses; OD right eye; OS left eye; OU both eyes  XT exotropia; ET esotropia; PEK punctate epithelial keratitis; PEE punctate epithelial erosions; DES dry eye syndrome; MGD meibomian gland dysfunction; ATs artificial tears; PFAT's preservative free artificial tears; [place] nuclear sclerotic cataract; PSC posterior subcapsular cataract; ERM epi-retinal membrane; PVD posterior vitreous detachment; RD retinal detachment; DM diabetes mellitus; DR diabetic retinopathy; NPDR non-proliferative diabetic retinopathy; PDR proliferative diabetic retinopathy; CSME clinically significant macular edema; DME diabetic macular edema; dbh dot blot hemorrhages; CWS cotton wool spot; POAG primary open angle glaucoma; C/D cup-to-disc ratio; HVF humphrey visual field;  GVF goldmann visual field; OCT optical coherence tomography; IOP intraocular pressure; BRVO Branch retinal vein occlusion; CRVO central retinal vein occlusion; CRAO central retinal artery occlusion; BRAO branch retinal artery occlusion; RT retinal tear; SB scleral buckle; PPV pars plana vitrectomy; VH Vitreous hemorrhage; PRP panretinal laser photocoagulation; IVK intravitreal kenalog; VMT vitreomacular traction; MH Macular hole;  NVD neovascularization of the disc; NVE neovascularization elsewhere; AREDS age related eye disease study; ARMD age related macular degeneration; POAG primary open angle glaucoma; EBMD epithelial/anterior basement membrane dystrophy; ACIOL anterior chamber intraocular lens; IOL intraocular lens; PCIOL posterior chamber intraocular lens; Phaco/IOL phacoemulsification with intraocular lens placement; [place] photorefractive keratectomy; LASIK laser assisted in situ keratomileusis; HTN hypertension; DM diabetes mellitus; COPD chronic obstructive pulmonary disease

## 2021-01-06 ENCOUNTER — Other Ambulatory Visit: Payer: Self-pay

## 2021-01-06 ENCOUNTER — Encounter (INDEPENDENT_AMBULATORY_CARE_PROVIDER_SITE_OTHER): Payer: Self-pay | Admitting: Ophthalmology

## 2021-01-06 ENCOUNTER — Ambulatory Visit (INDEPENDENT_AMBULATORY_CARE_PROVIDER_SITE_OTHER): Payer: Medicare Other | Admitting: Ophthalmology

## 2021-01-06 DIAGNOSIS — Z961 Presence of intraocular lens: Secondary | ICD-10-CM

## 2021-01-06 DIAGNOSIS — H35372 Puckering of macula, left eye: Secondary | ICD-10-CM | POA: Diagnosis not present

## 2021-01-06 DIAGNOSIS — H3321 Serous retinal detachment, right eye: Secondary | ICD-10-CM

## 2021-01-06 DIAGNOSIS — H40053 Ocular hypertension, bilateral: Secondary | ICD-10-CM

## 2021-01-06 DIAGNOSIS — H3581 Retinal edema: Secondary | ICD-10-CM | POA: Diagnosis not present

## 2021-01-06 DIAGNOSIS — M545 Low back pain, unspecified: Secondary | ICD-10-CM | POA: Diagnosis not present

## 2021-01-07 DIAGNOSIS — U071 COVID-19: Secondary | ICD-10-CM

## 2021-01-07 HISTORY — DX: COVID-19: U07.1

## 2021-01-10 ENCOUNTER — Encounter: Payer: Self-pay | Admitting: Family Medicine

## 2021-01-11 ENCOUNTER — Encounter: Payer: Self-pay | Admitting: Family Medicine

## 2021-01-13 DIAGNOSIS — R509 Fever, unspecified: Secondary | ICD-10-CM | POA: Diagnosis not present

## 2021-01-13 DIAGNOSIS — U071 COVID-19: Secondary | ICD-10-CM | POA: Diagnosis not present

## 2021-01-18 ENCOUNTER — Telehealth (INDEPENDENT_AMBULATORY_CARE_PROVIDER_SITE_OTHER): Payer: Medicare Other | Admitting: Family Medicine

## 2021-01-18 ENCOUNTER — Other Ambulatory Visit: Payer: Self-pay

## 2021-01-18 DIAGNOSIS — U071 COVID-19: Secondary | ICD-10-CM

## 2021-01-18 NOTE — Progress Notes (Signed)
This visit type was conducted due to national recommendations for restrictions regarding the COVID-19 pandemic in an effort to limit this patient's exposure and mitigate transmission in our community.   Virtual Visit via Video Note  I connected with [name] on 01/18/21 at  2:30 PM EDT by a video enabled telemedicine application and verified that I am speaking with the correct person using two identifiers.  Location patient: home Location provider:work or home office Persons participating in the virtual visit: patient, provider  I discussed the limitations of evaluation and management by telemedicine and the availability of in person appointments. The patient expressed understanding and agreed to proceed.   HPI:  Samuel [name] diagnosed with COVID 31 recently.  Onset of symptoms July 1.  This was on Friday.  He felt achy and this persisted into Saturday.  By the weekend he did a rapid test which came back positive.  He confirmed this with another test.  He developed some sinus congestion and mild cough.  Symptomatically better at this time.  He had some low-grade fever on about the third day but none since then.  His concern was that he still tested positive yesterday.  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  He is doing much better overall at this point.  He had gone to urgent care at 1 point early in the infection and was given Zithromax and prednisone taper.  Denies any shortness of breath or wheezing.  Has been immunized.  ROS: See pertinent positives and negatives per HPI.  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   severe- right anterior shoulder- (EXC)   Atypical nevus 07/18/2011   mild-mod-right upper abdomen   Basal cell carcinoma 07/17/2019   nod-left forearm-(CX35FU)   EPISTAXIS, RECURRENT 12/21/2009   ESSENTIAL HYPERTENSION 12/21/2009   Retinal detachment    OD   VENTRICULAR HYPERTROPHY, LEFT  01/04/2010    Past Surgical History:  Procedure Laterality Date   CATARACT EXTRACTION Bilateral 2009   EYE SURGERY Bilateral 2009   cataract   GAS/FLUID EXCHANGE Right 09/26/2018   Procedure: Gas/Fluid Exchange;  Surgeon: Bernarda Caffey, MD;  Location: [place];  Service: Ophthalmology;  Laterality: Right;   PHOTOCOAGULATION WITH LASER Right 09/26/2018   Procedure: Photocoagulation With Laser;  Surgeon: Bernarda Caffey, MD;  Location: [place];  Service: Ophthalmology;  Laterality: Right;   SCLERAL BUCKLE Right 09/26/2018   Procedure: Scleral Buckle;  Surgeon: Bernarda Caffey, MD;  Location: [place];  Service: Ophthalmology;  Laterality: Right;   VITRECTOMY 25 GAUGE WITH SCLERAL BUCKLE Right 09/26/2018   Procedure: VITRECTOMY 25 GAUGE;  Surgeon: Bernarda Caffey, MD;  Location: [place];  Service: Ophthalmology;  Laterality: Right;    Family History  Problem Relation Age of Onset   Cancer Mother        bladder CA   Hypertension Mother    Hypertension Father    Heart disease Father    Heart disease Maternal Grandfather    Hypertension Maternal Grandfather    Cancer Paternal Grandmother        ovarian CA   Heart disease Paternal Grandfather    Hypertension Paternal Grandfather     SOCIAL HX: Non-smoker   Current Outpatient Medications:    alclomethasone (ACLOVATE) 0.05 % cream, Apply topically 2 (two) times daily as needed (Rash)., Disp: 180 g, Rfl: 3   atorvastatin (LIPITOR) 20 MG tablet, Take 1 tablet (20 mg total) by mouth daily., Disp: 90 tablet,  Rfl: 3   bacitracin-polymyxin b (POLYSPORIN) ophthalmic ointment, Place 1 application into the right eye at bedtime. apply to eye every 12 hours while awake, Disp: , Rfl:    clobetasol (OLUX) 0.05 % topical foam, Apply topically 2 (two) times daily., Disp: 100 g, Rfl: 11   colchicine 0.6 MG tablet, In event of gout flare, take 2 pills, then 1 more pill an hour after, Disp: 12 tablet, Rfl: 0   dorzolamide-timolol (COSOPT) 22.3-6.8 MG/ML ophthalmic  solution, INSTILL 1 DROP IN EACH EYE ONCE DAILY, Disp: 10 mL, Rfl: 9   hydrochlorothiazide (HYDRODIURIL) 25 MG tablet, TAKE 1 TABLET(25 MG) BY MOUTH DAILY, Disp: 90 tablet, Rfl: 3   indomethacin (INDOCIN) 25 MG capsule, Take 25 mg by mouth 2 (two) times daily with a meal., Disp: , Rfl:    ketoconazole (NIZORAL) 2 % cream, Apply 1 application topically in the morning and at bedtime., Disp: 60 g, Rfl: 1   lisinopril (ZESTRIL) 20 MG tablet, TAKE 1 TABLET(20 MG) BY MOUTH DAILY, Disp: 90 tablet, Rfl: 3   metoprolol tartrate (LOPRESSOR) 50 MG tablet, TAKE 1 TABLET(50 MG) BY MOUTH DAILY, Disp: 90 tablet, Rfl: 3   sildenafil (VIAGRA) 100 MG tablet, TAKE 1/2 TO 1 TABLET BY MOUTH DAILY AS NEEDED FOR ERECTILE DYSFUNCTION, Disp: 10 tablet, Rfl: 5   atropine 1 % ophthalmic solution, Place 1 drop into the right eye 2 (two) times daily., Disp: , Rfl:    prednisoLONE acetate (PRED FORTE) 1 % ophthalmic suspension, Place 1 drop into the right eye 6 (six) times daily. (Patient not taking: Reported on 01/18/2021), Disp: 15 mL, Rfl: 1  EXAM:  VITALS per patient if applicable:  GENERAL: alert, oriented, appears well and in no acute distress  HEENT: atraumatic, conjunttiva clear, no obvious abnormalities on inspection of external nose and ears  NECK: normal movements of the head and neck  LUNGS: on inspection no signs of respiratory distress, breathing rate appears normal, no obvious gross SOB, gasping or wheezing  CV: no obvious cyanosis  MS: moves all visible extremities without noticeable abnormality  PSYCH/NEURO: pleasant and cooperative, no obvious depression or anxiety, speech and thought processing grossly intact  ASSESSMENT AND PLAN:  Discussed the following assessment and plan:   COVID-19 infection-clinically improving.  He has passed the 10-day isolation.  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  Follow-up for any recurrent fever or  other concerns  20 minutes spent discussing recent illness and answering questions.    I discussed the assessment and treatment plan with the patient. The patient was provided an opportunity to ask questions and all were answered. The patient agreed with the plan and demonstrated an understanding of the instructions.   The patient was advised to call back or seek an in-person evaluation if the symptoms worsen or if the condition fails to improve as anticipated.     Carolann Littler, MD

## 2021-01-21 DIAGNOSIS — M545 Low back pain, unspecified: Secondary | ICD-10-CM | POA: Diagnosis not present

## 2021-02-07 ENCOUNTER — Other Ambulatory Visit: Payer: Self-pay | Admitting: Family Medicine

## 2021-02-16 ENCOUNTER — Ambulatory Visit (INDEPENDENT_AMBULATORY_CARE_PROVIDER_SITE_OTHER): Payer: Medicare Other

## 2021-02-16 ENCOUNTER — Other Ambulatory Visit: Payer: Self-pay

## 2021-02-16 DIAGNOSIS — Z0000 Encounter for general adult medical examination without abnormal findings: Secondary | ICD-10-CM

## 2021-02-16 NOTE — Patient Instructions (Addendum)
Samuel [name] , Thank you for taking time to come for your Medicare Wellness Visit. I appreciate your ongoing commitment to your health goals. Please review the following plan we discussed and let me know if I can assist you in the future.   Screening recommendations/referrals: Colonoscopy: Pt stated cologuard done 2 years ago Recommended yearly ophthalmology/optometry visit for glaucoma screening and checkup Recommended yearly dental visit for hygiene and checkup  Vaccinations: Influenza vaccine: Due Pneumococcal vaccine: Completed Tdap vaccine: Due and discussed Shingles vaccine: Completed 01/09/17 & 07/27/17   Covid-19: Completed 3/3, 10/14/19  Advanced directives: Please bring a copy of your health care power of attorney and living will to the office at your convenience.  Conditions/risks identified: Lose weight   Next appointment: Follow up in one year for your annual wellness visit.   Preventive Care 38 Years and Older, Male Preventive care refers to lifestyle choices and visits with your health care provider that can promote health and wellness. What does preventive care include? A yearly physical exam. This is also called an annual well check. Dental exams once or twice a year. Routine eye exams. Ask your health care provider how often you should have your eyes checked. Personal lifestyle choices, including: Daily care of your teeth and gums. Regular physical activity. Eating a healthy diet. Avoiding tobacco and drug use. Limiting alcohol use. Practicing safe sex. Taking low doses of aspirin every day. Taking vitamin and mineral supplements as recommended by your health care provider. What happens during an annual well check? The services and screenings done by your health care provider during your annual well check will depend on your age, overall health, lifestyle risk factors, and family history of disease. Counseling  Your health care provider may ask you questions about  your: Alcohol use. Tobacco use. Drug use. Emotional well-being. Home and relationship well-being. Sexual activity. Eating habits. History of falls. Memory and ability to understand (cognition). Work and work Statistician. Screening  You may have the following tests or measurements: Height, weight, and BMI. Blood pressure. Lipid and cholesterol levels. These may be checked every 5 years, or more frequently if you are over 68 years old. Skin check. Lung cancer screening. You may have this screening every year starting at age 68 if you have a 30-pack-year history of smoking and currently smoke or have quit within the past 15 years. Fecal occult blood test (FOBT) of the stool. You may have this test every year starting at age 68. Flexible sigmoidoscopy or colonoscopy. You may have a sigmoidoscopy every 5 years or a colonoscopy every 10 years starting at age 68. Prostate cancer screening. Recommendations will vary depending on your family history and other risks. Hepatitis C blood test. Hepatitis B blood test. Sexually transmitted disease (STD) testing. Diabetes screening. This is done by checking your blood sugar (glucose) after you have not eaten for a while (fasting). You may have this done every 1-3 years. Abdominal aortic aneurysm (AAA) screening. You may need this if you are a current or former smoker. Osteoporosis. You may be screened starting at age 68 if you are at high risk. Talk with your health care provider about your test results, treatment options, and if necessary, the need for more tests. Vaccines  Your health care provider may recommend certain vaccines, such as: Influenza vaccine. This is recommended every year. Tetanus, diphtheria, and acellular pertussis (Tdap, Td) vaccine. You may need a Td booster every 10 years. Zoster vaccine. You may need this after age 68. Pneumococcal  13-valent conjugate (PCV13) vaccine. One dose is recommended after age 68. Pneumococcal  polysaccharide (PPSV23) vaccine. One dose is recommended after age 68. Talk to your health care provider about which screenings and vaccines you need and how often you need them. This information is not intended to replace advice given to you by your health care provider. Make sure you discuss any questions you have with your health care provider. Document Released: 07/23/2015 Document Revised: 03/15/2016 Document Reviewed: 04/27/2015 Elsevier Interactive Patient Education  2017 [place] Prevention in the Home Falls can cause injuries. They can happen to people of all ages. There are many things you can do to make your home safe and to help prevent falls. What can I do on the outside of my home? Regularly fix the edges of walkways and driveways and fix any cracks. Remove anything that might make you trip as you walk through a door, such as a raised step or threshold. Trim any bushes or trees on the path to your home. Use bright outdoor lighting. Clear any walking paths of anything that might make someone trip, such as rocks or tools. Regularly check to see if handrails are loose or broken. Make sure that both sides of any steps have handrails. Any raised decks and porches should have guardrails on the edges. Have any leaves, snow, or ice cleared regularly. Use sand or salt on walking paths during winter. Clean up any spills in your garage right away. This includes oil or grease spills. What can I do in the bathroom? Use night lights. Install grab bars by the toilet and in the tub and shower. Do not use towel bars as grab bars. Use non-skid mats or decals in the tub or shower. If you need to sit down in the shower, use a plastic, non-slip stool. Keep the floor dry. Clean up any water that spills on the floor as soon as it happens. Remove soap buildup in the tub or shower regularly. Attach bath mats securely with double-sided non-slip rug tape. Do not have throw rugs and other  things on the floor that can make you trip. What can I do in the bedroom? Use night lights. Make sure that you have a light by your bed that is easy to reach. Do not use any sheets or blankets that are too big for your bed. They should not hang down onto the floor. Have a firm chair that has side arms. You can use this for support while you get dressed. Do not have throw rugs and other things on the floor that can make you trip. What can I do in the kitchen? Clean up any spills right away. Avoid walking on wet floors. Keep items that you use a lot in easy-to-reach places. If you need to reach something above you, use a strong step stool that has a grab bar. Keep electrical cords out of the way. Do not use floor polish or wax that makes floors slippery. If you must use wax, use non-skid floor wax. Do not have throw rugs and other things on the floor that can make you trip. What can I do with my stairs? Do not leave any items on the stairs. Make sure that there are handrails on both sides of the stairs and use them. Fix handrails that are broken or loose. Make sure that handrails are as long as the stairways. Check any carpeting to make sure that it is firmly attached to the stairs. Fix any carpet  that is loose or worn. Avoid having throw rugs at the top or bottom of the stairs. If you do have throw rugs, attach them to the floor with carpet tape. Make sure that you have a light switch at the top of the stairs and the bottom of the stairs. If you do not have them, ask someone to add them for you. What else can I do to help prevent falls? Wear shoes that: Do not have high heels. Have rubber bottoms. Are comfortable and fit you well. Are closed at the toe. Do not wear sandals. If you use a stepladder: Make sure that it is fully opened. Do not climb a closed stepladder. Make sure that both sides of the stepladder are locked into place. Ask someone to hold it for you, if possible. Clearly  mark and make sure that you can see: Any grab bars or handrails. First and last steps. Where the edge of each step is. Use tools that help you move around (mobility aids) if they are needed. These include: Canes. Walkers. Scooters. Crutches. Turn on the lights when you go into a dark area. Replace any light bulbs as soon as they burn out. Set up your furniture so you have a clear path. Avoid moving your furniture around. If any of your floors are uneven, fix them. If there are any pets around you, be aware of where they are. Review your medicines with your doctor. Some medicines can make you feel dizzy. This can increase your chance of falling. Ask your doctor what other things that you can do to help prevent falls. This information is not intended to replace advice given to you by your health care provider. Make sure you discuss any questions you have with your health care provider. Document Released: 04/22/2009 Document Revised: 12/02/2015 Document Reviewed: 07/31/2014 Elsevier Interactive Patient Education  2017 [employer].

## 2021-02-16 NOTE — Progress Notes (Addendum)
Virtual Visit via Telephone Note  I connected with  Samuel [name] on 02/16/21 at 11:00 AM EDT by telephone and verified that I am speaking with the correct person using two identifiers.  Medicare Annual Wellness visit completed telephonically due to Covid-19 pandemic.   Persons participating in this call: This Health Coach and this patient.   Location: Patient: Home Provider: Office   I discussed the limitations, risks, security and privacy concerns of performing an evaluation and management service by telephone and the availability of in person appointments. The patient expressed understanding and agreed to proceed.  Unable to perform video visit due to video visit attempted and failed and/or patient does not have video capability.   Some vital signs may be absent or patient reported.   Willette Brace, LPN   Subjective:   Samuel [name] is a 70 y.o. male who presents for Medicare Annual/Subsequent preventive examination.  Review of Systems     Cardiac Risk Factors include: advanced age (>25mn, >>54women);hypertension;male gender;obesity (BMI >30kg/m2)     Objective:    There were no vitals filed for this visit. There is no height or weight on file to calculate BMI.  Advanced Directives 02/16/2021 02/10/2020 09/26/2018  Does Patient Have a Medical Advance Directive? Yes Yes Yes  Type of Advance Directive Living will HVioletLiving will -  Does patient want to make changes to medical advance directive? - - No - Patient declined  Copy of [place] Chart? - No - copy requested -    Current Medications (verified) Outpatient Encounter Medications as of 02/16/2021  Medication Sig   alclomethasone (ACLOVATE) 0.05 % cream Apply topically 2 (two) times daily as needed (Rash).   clobetasol (OLUX) 0.05 % topical foam Apply topically 2 (two) times daily.   colchicine 0.6 MG tablet In event of gout flare, take 2 pills, then 1 more pill an hour  after   dorzolamide-timolol (COSOPT) 22.3-6.8 MG/ML ophthalmic solution INSTILL 1 DROP IN EACH EYE ONCE DAILY   hydrochlorothiazide (HYDRODIURIL) 25 MG tablet TAKE 1 TABLET(25 MG) BY MOUTH DAILY   indomethacin (INDOCIN) 25 MG capsule Take 25 mg by mouth 2 (two) times daily with a meal.   ketoconazole (NIZORAL) 2 % cream Apply 1 application topically in the morning and at bedtime.   lisinopril (ZESTRIL) 20 MG tablet TAKE 1 TABLET(20 MG) BY MOUTH DAILY   metoprolol tartrate (LOPRESSOR) 50 MG tablet TAKE 1 TABLET(50 MG) BY MOUTH DAILY   sildenafil (VIAGRA) 100 MG tablet TAKE 0.5 TO 1 TABLET BY MOUTH DAILY AS NEEDED FOR ERECTILE DYSFUNCTION   atorvastatin (LIPITOR) 20 MG tablet Take 1 tablet (20 mg total) by mouth daily. (Patient not taking: Reported on 02/16/2021)   [DISCONTINUED] atropine 1 % ophthalmic solution Place 1 drop into the right eye 2 (two) times daily.   [DISCONTINUED] bacitracin-polymyxin b (POLYSPORIN) ophthalmic ointment Place 1 application into the right eye at bedtime. apply to eye every 12 hours while awake   [DISCONTINUED] prednisoLONE acetate (PRED FORTE) 1 % ophthalmic suspension Place 1 drop into the right eye 6 (six) times daily. (Patient not taking: Reported on 01/18/2021)   No facility-administered encounter medications on file as of 02/16/2021.    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/2011   mild-mod-right upper abdomen   Basal cell carcinoma  07/17/2019   nod-left forearm-([id number])   COVID 01/07/2021   EPISTAXIS, RECURRENT 12/21/2009   ESSENTIAL HYPERTENSION 12/21/2009   Retinal detachment    OD   VENTRICULAR HYPERTROPHY, LEFT 01/04/2010   Past Surgical History:  Procedure Laterality Date   CATARACT EXTRACTION Bilateral 2009   EYE SURGERY Bilateral 2009   cataract   GAS/FLUID  EXCHANGE Right 09/26/2018   Procedure: Gas/Fluid Exchange;  Surgeon: Bernarda Caffey, MD;  Location: [facility];  Service: Ophthalmology;  Laterality: Right;   PHOTOCOAGULATION WITH LASER Right 09/26/2018   Procedure: Photocoagulation With Laser;  Surgeon: Bernarda Caffey, MD;  Location: [facility];  Service: Ophthalmology;  Laterality: Right;   SCLERAL BUCKLE Right 09/26/2018   Procedure: Scleral Buckle;  Surgeon: Bernarda Caffey, MD;  Location: [facility];  Service: Ophthalmology;  Laterality: Right;   VITRECTOMY 25 GAUGE WITH SCLERAL BUCKLE Right 09/26/2018   Procedure: VITRECTOMY 25 GAUGE;  Surgeon: Bernarda Caffey, MD;  Location: [facility];  Service: Ophthalmology;  Laterality: Right;   Family History  Problem Relation Age of Onset   Cancer Mother        bladder CA   Hypertension Mother    Hypertension Father    Heart disease Father    Heart disease Maternal Grandfather    Hypertension Maternal Grandfather    Cancer Paternal Grandmother        ovarian CA   Heart disease Paternal Grandfather    Hypertension Paternal Grandfather    Social History   Socioeconomic History   Marital status: Married    Spouse name: Not on file   Number of children: Not on file   Years of education: Not on file   Highest education level: Not on file  Occupational History   Occupation: Insuarnce agent  Tobacco Use   Smoking status: Former    Packs/day: 1.00    Years: 18.00    Pack years: 18.00    Types: Cigars, Cigarettes    Quit date: 08/22/1988    Years since quitting: 32.5   Smokeless tobacco: Never  Vaping Use   Vaping Use: Never used  Substance and Sexual Activity   Alcohol use: Yes    Comment: social   Drug use: Never   Sexual activity: Not on file  Other Topics Concern   Not on file  Social History Narrative   Not on file   Social Determinants of Health   Financial Resource Strain: Low Risk    Difficulty of Paying Living Expenses: Not hard at all  Food Insecurity: No Food Insecurity   Worried About  Charity fundraiser in the Last Year: Never true   [place] in the Last Year: Never true  Transportation Needs: No Transportation Needs   Lack of Transportation (Medical): No   Lack of Transportation (Non-Medical): No  Physical Activity: Inactive   Days of Exercise per Week: 0 days   Minutes of Exercise per Session: 0 min  Stress: No Stress Concern Present   Feeling of Stress : Not at all  Social Connections: Moderately Integrated   Frequency of Communication with Friends and Family: More than three times a week   Frequency of Social Gatherings with Friends and Family: More than three times a week   Attends Religious Services: 1 to 4 times per year   Active Member of [employer] or Organizations: Yes   Attends Archivist Meetings: 1 to 4 times per year   Marital Status: Separated    Tobacco Counseling Counseling given: Not  Answered   Clinical Intake:  Pre-visit preparation completed: Yes  Pain : No/denies pain     BMI - recorded: 39.46 Nutritional Status: BMI > 30  Obese Nutritional Risks: None Diabetes: No  How often do you need to have someone help you when you read instructions, pamphlets, or other written materials from your doctor or pharmacy?: 1 - Never  Diabetic?No  Interpreter Needed?: No  Information entered by :: Charlott Rakes, LPN   Activities of Daily Living In your present state of health, do you have any difficulty performing the following activities: 02/16/2021  Hearing? N  Vision? N  Difficulty concentrating or making decisions? N  Walking or climbing stairs? Y  Dressing or bathing? N  Doing errands, shopping? N  Preparing Food and eating ? N  Using the Toilet? N  In the past six months, have you accidently leaked urine? N  Do you have problems with loss of bowel control? N  Managing your Medications? N  Managing your Finances? N  Housekeeping or managing your Housekeeping? N  Some recent data might be hidden    Patient Care  Team: Eulas Post, MD as PCP - [facility] [place], Ronalee Red, PA-C as Physician Assistant (Dermatology)  Indicate any recent Medical Services you may have received from other than [facility] providers in the past year (date may be approximate).     Assessment:   This is a routine wellness examination for Samuel [name].  Hearing/Vision screen Hearing Screening - Comments:: Pt denies any hearing issues  Vision Screening - Comments:: Pt follows up with dr Marica Otter for annual eye exams   Dietary issues and exercise activities discussed: Current Exercise Habits: The patient does not participate in regular exercise at present   Goals Addressed             This Visit's Progress    Patient Stated       Lose weight        Depression Screen PHQ 2/9 Scores 02/16/2021 02/10/2020 11/04/2019 08/14/2018  PHQ - 2 Score 0 0 0 0  PHQ- 9 Score - - - 0    Fall Risk Fall Risk  02/16/2021 02/10/2020  Falls in the past year? 0 0  Number falls in past yr: 0 0  Injury with Fall? 0 0  Risk for fall due to : Impaired balance/gait;Impaired vision;Impaired mobility Impaired vision  Follow up Falls prevention discussed -    FALL RISK PREVENTION PERTAINING TO THE HOME:  Any stairs in or around the home? Yes  If so, are there any without handrails? yes Home free of loose throw rugs in walkways, pet beds, electrical cords, etc? Yes  Adequate lighting in your home to reduce risk of falls? Yes   ASSISTIVE DEVICES UTILIZED TO PREVENT FALLS:  Life alert? No  Use of a cane, walker or w/c? Yes  Grab bars in the bathroom? No  Shower chair or bench in shower? No  Elevated toilet seat or a handicapped toilet? No   TIMED UP AND GO:  Was the test performed? No .   Cognitive Function:     6CIT Screen 02/16/2021 02/10/2020  What Year? 0 points 0 points  What month? 0 points 0 points  What time? 0 points -  Count back from 20 0 points 0 points  Months in reverse 0 points 0 points  Repeat phrase 0 points 0  points  Total Score 0 -    Immunizations Immunization History  Administered Date(s) Administered  Influenza,inj,Quad PF,6+ Mos 04/01/2014   Moderna Sars-Covid-2 Vaccination 09/10/2019, 10/14/2019   Pneumococcal Conjugate-13 08/14/2018   Pneumococcal Polysaccharide-23 10/29/2020   Td 02/18/2010   Zoster Recombinat (Shingrix) 01/09/2017, 07/17/2017    TDAP status: Due, Education has been provided regarding the importance of this vaccine. Advised may receive this vaccine at local pharmacy or Health Dept. Aware to provide a copy of the vaccination record if obtained from local pharmacy or Health Dept. Verbalized acceptance and understanding.  Flu Vaccine status: Due, Education has been provided regarding the importance of this vaccine. Advised may receive this vaccine at local pharmacy or Health Dept. Aware to provide a copy of the vaccination record if obtained from local pharmacy or Health Dept. Verbalized acceptance and understanding.  Pneumococcal vaccine status: Up to date  Covid-19 vaccine status: Completed vaccines  Qualifies for Shingles Vaccine? Yes   Zostavax completed No   Shingrix Completed?: Yes  Screening Tests Health Maintenance  Topic Date Due   COVID-19 Vaccine (3 - Moderna risk series) 11/11/2019   TETANUS/TDAP  02/19/2020   INFLUENZA VACCINE  02/07/2021   COLONOSCOPY (Pts 45-32yr Insurance coverage will need to be confirmed)  10/19/2021 (Originally 01/13/1996)   Hepatitis C Screening  Completed   PNA vac Low Risk Adult  Completed   Zoster Vaccines- Shingrix  Completed   HPV VACCINES  Aged Out    Health Maintenance  Health Maintenance Due  Topic Date Due   COVID-19 Vaccine (3 - Moderna risk series) 11/11/2019   TETANUS/TDAP  02/19/2020   INFLUENZA VACCINE  02/07/2021    Pt stated he had a cologuard 2 years ago please advise if he needs another this year   Additional Screening:  Hepatitis C Screening:  Completed 01/09/17  Vision Screening:  Recommended annual ophthalmology exams for early detection of glaucoma and other disorders of the eye. Is the patient up to date with their annual eye exam?  Yes  Who is the provider or what is the name of the office in which the patient attends annual eye exams?  Dr SMarica Otter If pt is not established with a provider, would they like to be referred to a provider to establish care? No .   Dental Screening: Recommended annual dental exams for proper oral hygiene  Community Resource Referral / Chronic Care Management: CRR required this visit?  No   CCM required this visit?  No      Plan:     I have personally reviewed and noted the following in the patient's chart:   Medical and social history Use of alcohol, tobacco or illicit drugs  Current medications and supplements including opioid prescriptions. Patient is not currently taking opioid prescriptions. Functional ability and status Nutritional status Physical activity Advanced directives List of other physicians Hospitalizations, surgeries, and ER visits in previous 12 months Vitals Screenings to include cognitive, depression, and falls Referrals and appointments  In addition, I have reviewed and discussed with patient certain preventive protocols, quality metrics, and best practice recommendations. A written personalized care plan for preventive services as well as general preventive health recommendations were provided to patient.     TWillette Brace LPN   [id number]  Nurse Notes: None

## 2021-03-01 ENCOUNTER — Ambulatory Visit (INDEPENDENT_AMBULATORY_CARE_PROVIDER_SITE_OTHER): Payer: Medicare Other | Admitting: Family Medicine

## 2021-03-01 ENCOUNTER — Encounter (INDEPENDENT_AMBULATORY_CARE_PROVIDER_SITE_OTHER): Payer: Self-pay | Admitting: Family Medicine

## 2021-03-01 ENCOUNTER — Other Ambulatory Visit: Payer: Self-pay

## 2021-03-01 VITALS — BP 135/68 | HR 48 | Temp 98.0°F | Ht 69.0 in | Wt 269.0 lb

## 2021-03-01 DIAGNOSIS — R7303 Prediabetes: Secondary | ICD-10-CM | POA: Diagnosis not present

## 2021-03-01 DIAGNOSIS — E785 Hyperlipidemia, unspecified: Secondary | ICD-10-CM | POA: Insufficient documentation

## 2021-03-01 DIAGNOSIS — R0602 Shortness of breath: Secondary | ICD-10-CM

## 2021-03-01 DIAGNOSIS — Z0289 Encounter for other administrative examinations: Secondary | ICD-10-CM

## 2021-03-01 DIAGNOSIS — I10 Essential (primary) hypertension: Secondary | ICD-10-CM | POA: Diagnosis not present

## 2021-03-01 DIAGNOSIS — M1611 Unilateral primary osteoarthritis, right hip: Secondary | ICD-10-CM | POA: Diagnosis not present

## 2021-03-01 DIAGNOSIS — R5383 Other fatigue: Secondary | ICD-10-CM

## 2021-03-01 DIAGNOSIS — Z6839 Body mass index (BMI) 39.0-39.9, adult: Secondary | ICD-10-CM

## 2021-03-01 DIAGNOSIS — E7849 Other hyperlipidemia: Secondary | ICD-10-CM

## 2021-03-01 DIAGNOSIS — Z1331 Encounter for screening for depression: Secondary | ICD-10-CM | POA: Diagnosis not present

## 2021-03-02 LAB — CBC WITH DIFFERENTIAL/PLATELET
Basophils Absolute: 0.1 10*3/uL (ref 0.0–0.2)
Basos: 1 %
EOS (ABSOLUTE): 0.2 10*3/uL (ref 0.0–0.4)
Eos: 2 %
Hematocrit: 45.6 % (ref 37.5–51.0)
Hemoglobin: 15.8 g/dL (ref 13.0–17.7)
Immature Grans (Abs): 0 10*3/uL (ref 0.0–0.1)
Immature Granulocytes: 0 %
Lymphocytes Absolute: 2 10*3/uL (ref 0.7–3.1)
Lymphs: 21 %
MCH: 32.6 pg (ref 26.6–33.0)
MCHC: 34.6 g/dL (ref 31.5–35.7)
MCV: 94 fL (ref 79–97)
Monocytes Absolute: 1.1 10*3/uL — ABNORMAL HIGH (ref 0.1–0.9)
Monocytes: 12 %
Neutrophils Absolute: 6.1 10*3/uL (ref 1.4–7.0)
Neutrophils: 64 %
Platelets: 300 10*3/uL (ref 150–450)
RBC: 4.84 x10E6/uL (ref 4.14–5.80)
RDW: 12.8 % (ref 11.6–15.4)
WBC: 9.5 10*3/uL (ref 3.4–10.8)

## 2021-03-02 LAB — COMPREHENSIVE METABOLIC PANEL
ALT: 51 IU/L — ABNORMAL HIGH (ref 0–44)
AST: 47 IU/L — ABNORMAL HIGH (ref 0–40)
Albumin/Globulin Ratio: 1.7 (ref 1.2–2.2)
Albumin: 4.5 g/dL (ref 3.8–4.8)
Alkaline Phosphatase: 64 IU/L (ref 44–121)
BUN/Creatinine Ratio: 19 (ref 10–24)
BUN: 23 mg/dL (ref 8–27)
Bilirubin Total: 1 mg/dL (ref 0.0–1.2)
CO2: 24 mmol/L (ref 20–29)
Calcium: 9.8 mg/dL (ref 8.6–10.2)
Chloride: 97 mmol/L (ref 96–106)
Creatinine, Ser: 1.23 mg/dL (ref 0.76–1.27)
Globulin, Total: 2.6 g/dL (ref 1.5–4.5)
Glucose: 123 mg/dL — ABNORMAL HIGH (ref 65–99)
Potassium: 4.4 mmol/L (ref 3.5–5.2)
Sodium: 137 mmol/L (ref 134–144)
Total Protein: 7.1 g/dL (ref 6.0–8.5)
eGFR: 64 mL/min/{1.73_m2} (ref >59)

## 2021-03-02 LAB — LIPID PANEL WITH LDL/HDL RATIO
Cholesterol, Total: 196 mg/dL (ref 100–199)
HDL: 44 mg/dL (ref >39)
LDL Chol Calc (NIH): 130 mg/dL — ABNORMAL HIGH (ref 0–99)
LDL/HDL Ratio: 3 ratio (ref 0.0–3.6)
Triglycerides: 121 mg/dL (ref 0–149)
VLDL Cholesterol Cal: 22 mg/dL (ref 5–40)

## 2021-03-02 LAB — INSULIN, RANDOM: INSULIN: 35.6 u[IU]/mL — ABNORMAL HIGH (ref 2.6–24.9)

## 2021-03-02 LAB — T4, FREE: Free T4: 1.2 ng/dL (ref 0.82–1.77)

## 2021-03-02 LAB — HEMOGLOBIN A1C
Est. average glucose Bld gHb Est-mCnc: 120 mg/dL
Hgb A1c MFr Bld: 5.8 % — ABNORMAL HIGH (ref 4.8–5.6)

## 2021-03-02 LAB — TSH: TSH: 2.13 u[IU]/mL (ref 0.450–4.500)

## 2021-03-02 NOTE — Progress Notes (Signed)
Chief Complaint:   OBESITY Samuel [name] (MR# [id number]) is a 70 y.o. male who presents for evaluation and treatment of obesity and related comorbidities. Current BMI is Body mass index is 39.72 kg/m. Samuel [name] has been struggling with his weight for many years and has been unsuccessful in either losing weight, maintaining weight loss, or reaching his healthy weight goal.  Samuel [name] was sent to our clinic. 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 He lives alone "separated". He desires to lose 75+ lbs in 6-12 months to improve joint pains and his overall health. He never tried a weight loss meal plan in the past. He used to walk 2-3 miles per day but due to orthopedic issues, he had to stop. He snacks on crackers, chex mix, and he identifies his snacks as "worst habit".  Samuel [name] is currently in the action stage of change and ready to dedicate time achieving and maintaining a healthier weight. Samuel [name] is interested in becoming our patient and working on intensive lifestyle modifications including (but not limited to) diet and exercise for weight loss.  Samuel [name]'s habits were reviewed today and are as follows: his desired weight loss is 69 lbs, he has been heavy most of his life, he started gaining weight 2 years ago, his heaviest weight ever was 275 pounds, he snacks frequently in the evenings, he skips meals frequently, and he is frequently drinking liquids with calories.  Depression Screen Samuel [name]'s Food and Mood (modified PHQ-9) score was 3.  Depression screen Samuel [name] 2/9 03/01/2021  Decreased Interest 1  Down, Depressed, Hopeless 0  PHQ - 2 Score 1  Altered sleeping 1  Tired, decreased energy 1  Change in appetite 0  Feeling bad or failure about yourself  0  Trouble concentrating 0  Moving slowly or fidgety/restless 0  Suicidal thoughts 0  PHQ-9 Score 3  Difficult doing work/chores Not difficult at all   Subjective:   1. Other fatigue Samuel [name] admits to daytime somnolence and admits  to waking up still tired. Patent has a history of symptoms of daytime fatigue. Samuel [name] generally gets 6 or 7 hours of sleep per night, and states that he has difficulty falling asleep. Snoring is present. Apneic episodes are not present. Epworth Sleepiness Score is 5.  2. Shortness of breath on exertion Samuel [name] notes increasing shortness of breath with exercising and seems to be worsening over time with weight gain. He notes getting out of breath sooner with activity than he used to. This has not gotten worse recently. Samuel [name] denies shortness of breath at rest or orthopnea.  3. Pre-diabetes Samuel [name]'s last A1c was 5.8 3-4 months ago. He is not on medications.  4. Hypertension, unspecified type Samuel [name] is taking Toprol, lisinopril, and hydrochlorothiazide. He is tolerating medication(s) well without side effects.  Medication compliance is good and patient appears to be taking it as prescribed.  Denies additional concerns regarding this condition.   5. Osteoarthritis of right hip, unspecified osteoarthritis type Samuel [name] notes joint pain in other joints as well, back, knee, etc. He notes a history of gout as well.  6. Other hyperlipidemia Samuel [name] had an elevated LDL in the past. His primary care physician prescribed statin (Lipitor) recently due to coronary calcium score being elevated. He did not take it due to joint aches.  Assessment/Plan:   Orders Placed This Encounter  Procedures   CBC with Differential/Platelet   Comprehensive metabolic panel   Hemoglobin A1c   Insulin, random  Lipid Panel With LDL/HDL Ratio   T4, free   TSH   EKG 12-Lead    Medications Discontinued During This Encounter  Medication Reason   atorvastatin (LIPITOR) 20 MG tablet Error     No orders of the defined types were placed in this encounter.    1. Other fatigue Samuel [name] does feel that his weight is causing his energy to be lower than it should be. Fatigue may be related to obesity, depression or many  other causes. Labs will be ordered, and in the meanwhile, Samuel [name] will focus on self care including making healthy food choices, increasing physical activity and focusing on stress reduction.  - CBC with Differential/Platelet - Comprehensive metabolic panel - EKG [id number] - Lipid Panel With LDL/HDL Ratio - T4, free - TSH  2. Shortness of breath on exertion Samuel [name] does feel that he gets out of breath more easily that he used to when he exercises. Samuel [name]'s shortness of breath appears to be obesity related and exercise induced. He has agreed to work on weight loss and gradually increase exercise to treat his exercise induced shortness of breath. Will continue to monitor closely.  3. Pre-diabetes Samuel [name] will continue to work on weight loss, exercise, and decreasing simple carbohydrates to help decrease the risk of diabetes. We will check labs today.  - Hemoglobin A1c - Insulin, random  4. Hypertension, unspecified type Samuel [name] will work on healthy weight loss and exercise to improve blood pressure control. We will check labs today, and will watch for signs of hypotension as he continues his lifestyle modifications.  - Comprehensive metabolic panel  5. Osteoarthritis of right hip, unspecified osteoarthritis type   6. Other hyperlipidemia Cardiovascular risk and specific lipid/LDL goals reviewed. We discussed several lifestyle modifications today. We will check labs today. Samuel [name] will continue to work on diet, exercise and weight loss efforts. Orders and follow up as documented in patient record.   Counseling Intensive lifestyle modifications are the first line treatment for this issue. Dietary changes: Increase soluble fiber. Decrease simple carbohydrates. Exercise changes: Moderate to vigorous-intensity aerobic activity 150 minutes per week if tolerated. Lipid-lowering medications: see documented in medical record.  - Comprehensive metabolic panel - Lipid Panel With LDL/HDL Ratio  7.  Screening for depression Samuel [name] had a negative depression screening. Depression is commonly associated with obesity and often results in emotional eating behaviors. We will monitor this closely and work on CBT to help improve the non-hunger eating patterns. Referral to Psychology may be required if no improvement is seen as he continues in our clinic.  8. Obesity with current BMI of 39.8 Samuel [name] is currently in the action stage of change and his goal is to continue with weight loss efforts. I recommend Samuel [name] begin the structured treatment plan as follows:  He has agreed to the Category 3 Plan with only 200 snack calories.  Exercise goals: As is.   Behavioral modification strategies: increasing lean protein intake, decreasing simple carbohydrates, and no skipping meals.  He was informed of the importance of frequent follow-up visits to maximize his success with intensive lifestyle modifications for his multiple health conditions. He was informed we would discuss his lab results at his next visit unless there is a critical issue that needs to be addressed sooner. Samuel [name] agreed to keep his next visit at the agreed upon time to discuss these results.  Objective:   Blood pressure 135/68, pulse (!) 48, temperature 98 F (36.7 C), height '5\' 9"'$  (1.753 m), weight 269 lb (122 kg),  SpO2 94 %. Body mass index is 39.72 kg/m.  EKG: Normal sinus rhythm, rate 46 BPM.  Indirect Calorimeter completed today shows a VO2 of 281 and a REE of 1944.  His calculated basal metabolic rate is [id number] thus his basal metabolic rate is worse than expected.  General: Cooperative, alert, well developed, in no acute distress. HEENT: Conjunctivae and lids unremarkable. Cardiovascular: Regular rhythm.  Lungs: Normal work of breathing. Neurologic: No focal deficits.   Lab Results  Component Value Date   CREATININE 1.23 03/01/2021   BUN 23 03/01/2021   NA 137 03/01/2021   K 4.4 03/01/2021   CL 97 03/01/2021   CO2 24  03/01/2021   Lab Results  Component Value Date   ALT 51 (H) 03/01/2021   AST 47 (H) 03/01/2021   ALKPHOS 64 03/01/2021   BILITOT 1.0 03/01/2021   Lab Results  Component Value Date   HGBA1C 5.8 (H) 03/01/2021   HGBA1C 5.8 10/29/2020   HGBA1C 5.9 11/04/2019   HGBA1C 6.0 08/14/2018   HGBA1C 5.8 07/17/2017   Lab Results  Component Value Date   INSULIN 35.6 (H) 03/01/2021   Lab Results  Component Value Date   TSH 2.130 03/01/2021   Lab Results  Component Value Date   CHOL 196 03/01/2021   HDL 44 03/01/2021   LDLCALC 130 (H) 03/01/2021   TRIG 121 03/01/2021   CHOLHDL 4 10/29/2020   Lab Results  Component Value Date   WBC 9.5 03/01/2021   HGB 15.8 03/01/2021   HCT 45.6 03/01/2021   MCV 94 03/01/2021   PLT 300 03/01/2021   No results found for: IRON, TIBC, FERRITIN Obesity Behavioral Intervention:   Approximately 15 minutes were spent on the discussion below.  ASK: We discussed the diagnosis of obesity with Samuel [name] today and Eddison agreed to give [place] permission to discuss obesity behavioral modification therapy today.  ASSESS: Nim has the diagnosis of obesity and his BMI today is 39.8. Shivansh is in the action stage of change.   ADVISE: Samuel [name] was educated on the multiple health risks of obesity as well as the benefit of weight loss to improve his health. He was advised of the need for long term treatment and the importance of lifestyle modifications to improve his current health and to decrease his risk of future health problems.  AGREE: Multiple dietary modification options and treatment options were discussed and Celestino agreed to follow the recommendations documented in the above note.  ARRANGE: Samuel [name] was educated on the importance of frequent visits to treat obesity as outlined per CMS and USPSTF guidelines and agreed to schedule his next follow up appointment today.  Attestation Statements:   Reviewed by clinician on day of visit: allergies, medications,  problem list, medical history, surgical history, family history, social history, and previous encounter notes.   Wilhemena Durie, am acting as transcriptionist for [employer], DO.  I have reviewed the above documentation for accuracy and completeness, and I agree with the above. Marjory Sneddon, D.O.  The [place] was signed into law in 2016 which includes the topic of electronic health records.  This provides immediate access to information in MyChart.  This includes consultation notes, operative notes, office notes, lab results and pathology reports.  If you have any questions about what you read please let us know at your next visit so we can discuss your concerns and take corrective action if need be.  We are right here with you.

## 2021-03-15 ENCOUNTER — Ambulatory Visit (INDEPENDENT_AMBULATORY_CARE_PROVIDER_SITE_OTHER): Payer: Medicare Other | Admitting: Family Medicine

## 2021-03-15 ENCOUNTER — Encounter (INDEPENDENT_AMBULATORY_CARE_PROVIDER_SITE_OTHER): Payer: Self-pay | Admitting: Family Medicine

## 2021-03-15 ENCOUNTER — Other Ambulatory Visit: Payer: Self-pay

## 2021-03-15 VITALS — BP 126/61 | HR 43 | Temp 98.2°F | Ht 69.0 in | Wt 263.0 lb

## 2021-03-15 DIAGNOSIS — E7849 Other hyperlipidemia: Secondary | ICD-10-CM | POA: Diagnosis not present

## 2021-03-15 DIAGNOSIS — I10 Essential (primary) hypertension: Secondary | ICD-10-CM | POA: Diagnosis not present

## 2021-03-15 DIAGNOSIS — R7303 Prediabetes: Secondary | ICD-10-CM | POA: Diagnosis not present

## 2021-03-15 DIAGNOSIS — Z6839 Body mass index (BMI) 39.0-39.9, adult: Secondary | ICD-10-CM | POA: Diagnosis not present

## 2021-03-15 DIAGNOSIS — K760 Fatty (change of) liver, not elsewhere classified: Secondary | ICD-10-CM

## 2021-03-16 NOTE — Progress Notes (Signed)
Chief Complaint:   OBESITY Samuel [name] is here to discuss his progress with his obesity treatment plan along with follow-up of his obesity related diagnoses. Kaelum is on the Category 3 Plan + 200 calories and states he is following his eating plan approximately 75-80% of the time. Kishon states he is doing 0 minutes 0 times per week.  Today's visit was #: 2 Starting weight: 269 lbs Starting date: 03/01/2021 Today's weight: 263 lbs Today's date: 03/15/2021 Total lbs lost to date: 6 Total lbs lost since last in-office visit: 6  Interim History: Leanor Kail Siegert is here today for his first follow-up office visit since starting the program with [place].  All blood work/ lab tests that were recently ordered by myself or an outside provider were reviewed with patient today per their request.   Extended time was spent counseling him on all new disease processes that were discovered or preexisting ones that are affected by BMI.  he understands that many of these abnormalities will need to monitored regularly along with the current treatment plan of prudent dietary changes, in which we are making each and every office visit, to improve these health parameters. Burdell cut out a lot of his juices, crackers, and snacks. He was away on a work function for 2-3 days and he did portion control and made smarter choices during that time.  Subjective:   1. Pre-diabetes Bartolome has a diagnosis of pre-diabetes based on his elevated HgA1c and was informed this puts him at greater risk of developing diabetes. He continues to work on diet and exercise to decrease his risk of diabetes. He is not on medications, and he denies nausea or hypoglycemia. I discussed labs with the patient today.  2. NAFLD (nonalcoholic fatty liver disease) Sylvio has a new diagnosis of NAFLD. His ALT and AST are elevated. He denies a history of this or elevation of enzymes. He does drink 2-3 glass of bourbon or hard liquor per night. I discussed labs  with the patient today.  3. Other hyperlipidemia Donaciano has worsening hyperlipidemia. His LDL is not at goal. He failed Lipitor in the past due to myalgias. I discussed labs with the patient today.  4. Essential hypertension Brendyn has a high normal serum creatinine. His blood pressure is at goal. I discussed labs with the patient today.  Assessment/Plan:  No orders of the defined types were placed in this encounter.   There are no discontinued medications.   No orders of the defined types were placed in this encounter.    1. Pre-diabetes We will consider medications in the future if appropriate. Zisha will continue to follow up as directed.  - I reiterated and again counseled patient on pathophysiology of the disease process of Pre-DM.   - Stressed importance of dietary and lifestyle modifications resulting in weight loss as first line txmnt - in addition we discussed the risks and benefits of various medication options which can help [place] in the management of this disease process as well as with weight loss.  Will consider starting one of these meds in future as will focus on prudent nutritional plan at this time.  - continue to decrease simple carbs; increase fiber and proteins -> follow meal plan  - handouts provided at pt's request after education provided.  All concerns/questions addressed.   - anticipatory guidance given.   - Recheck A1c and fasting insulin level in approximately 3 months from last check or as deemed fit.   2. NAFLD (  nonalcoholic fatty liver disease) Lab results reviewed with patient. Counseling: Intensive lifestyle modifications are the first line treatment for this issue. We discussed several lifestyle modifications today. Rumaldo will continue his prudent nutritional plan, weight loss, and decrease ETOH intake. We will recheck labs in 3-4 months. Orders and follow up as documented in patient record.  Counseling:  NAFLD is an umbrella term that encompasses a  disease spectrum that includes steatosis (fat) without inflammation, steatohepatitis (NASH; fat + inflammation in a characteristic pattern), and cirrhosis. Bland steatosis is felt to be a benign condition, with extremely low to no risk of progression to cirrhosis, whereas NASH can progress to cirrhosis. The mainstay of treatment of NAFLD includes lifestyle modification to achieve weight loss, at least 7% of current body weight. Low carbohydrate diets can be beneficial in improving NAFLD liver histology. Additionally, exercise, even the absence of weight loss can have beneficial effects on the patient's metabolic profile and liver health. We recommend that their metabolic comorbidities be aggressively managed, as patients with NAFLD are at increased risk of coronary artery disease. Vitamin E has been demonstrated to improve hepatic steatosis and inflammation in non-diabetic patients with NASH.  3. Other hyperlipidemia Leanor Kail Slee reports compliance with meds and/or treatment plan such as low saturated and trans fat low cholesterol meal plan. He will follow his prudent nutritional plan, decrease saturated and trans fats. He is to discuss with his primary care physician about alternative medications such as low dose Crestor, even every other night to start, and will eventually increase exercise.  - Rec: aerobic activity with eventual goal of a minimum of 150+ min wk plus 2 days/ week of resistance strength training - Cardiovascular risk and specific lipid/LDL goals reviewed.  We discussed several lifestyle modifications today and Bethany will continue to work on diet, exercise and weight loss efforts.  - Will continue routine screening as patient continues with health goals and weight loss journey  4. Essential hypertension Winifred's BP is at goal today.   - Counseled Leanor Kail Eschmann on pathophysiology of disease and discussed treatment plan, which always includes dietary and lifestyle modification as  first line.  - Lifestyle changes such as following our low salt, heart healthy meal plan and engaging in a regular exercise program discussed  - Avoid buying foods that are: processed, frozen, or prepackaged to avoid excess salt. - Ambulatory blood pressure monitoring encouraged.  Reminded patient that if they ever feel poorly in any way, to check their blood pressure and pulse as well. - We will continue to monitor closely alongside PCP/ specialists.  Pt reminded to also f/up with those individuals as instructed by them.  - We will continue to monitor symptoms as they relate to the his weight loss journey.  5. Obesity with current BMI of 38.8 Ashish is currently in the action stage of change. As such, his goal is to continue with weight loss efforts. He has agreed to the Category 3 Plan + 200 calories.   Exercise goals: All adults should avoid inactivity. Some physical activity is better than none, and adults who participate in any amount of physical activity gain some health benefits.  Behavioral modification strategies: increasing lean protein intake, decreasing simple carbohydrates, and planning for success.  Samuel [name] has agreed to follow-up with our clinic in 2 weeks. He was informed of the importance of frequent follow-up visits to maximize his success with intensive lifestyle modifications for his multiple health conditions.   Objective:   Blood pressure 126/61, pulse Marland Kitchen)  43, temperature 98.2 F (36.8 C), height '5\' 9"'$  (1.753 m), weight 263 lb (119.3 kg), SpO2 95 %. Body mass index is 38.84 kg/m.  General: Cooperative, alert, well developed, in no acute distress. HEENT: Conjunctivae and lids unremarkable. Cardiovascular: Regular rhythm.  Lungs: Normal work of breathing. Neurologic: No focal deficits.   Lab Results  Component Value Date   CREATININE 1.23 03/01/2021   BUN 23 03/01/2021   NA 137 03/01/2021   K 4.4 03/01/2021   CL 97 03/01/2021   CO2 24 03/01/2021   Lab Results   Component Value Date   ALT 51 (H) 03/01/2021   AST 47 (H) 03/01/2021   ALKPHOS 64 03/01/2021   BILITOT 1.0 03/01/2021   Lab Results  Component Value Date   HGBA1C 5.8 (H) 03/01/2021   HGBA1C 5.8 10/29/2020   HGBA1C 5.9 11/04/2019   HGBA1C 6.0 08/14/2018   HGBA1C 5.8 07/17/2017   Lab Results  Component Value Date   INSULIN 35.6 (H) 03/01/2021   Lab Results  Component Value Date   TSH 2.130 03/01/2021   Lab Results  Component Value Date   CHOL 196 03/01/2021   HDL 44 03/01/2021   LDLCALC 130 (H) 03/01/2021   TRIG 121 03/01/2021   CHOLHDL 4 10/29/2020   No results found for: VD25OH Lab Results  Component Value Date   WBC 9.5 03/01/2021   HGB 15.8 03/01/2021   HCT 45.6 03/01/2021   MCV 94 03/01/2021   PLT 300 03/01/2021   No results found for: IRON, TIBC, FERRITIN  Attestation Statements:   Reviewed by clinician on day of visit: allergies, medications, problem list, medical history, surgical history, family history, social history, and previous encounter notes.  Time spent on visit including pre-visit chart review and post-visit care and charting was 45 minutes.    Wilhemena Durie, am acting as transcriptionist for [employer], DO.  I have reviewed the above documentation for accuracy and completeness, and I agree with the above. Marjory Sneddon, D.O.  The [place] was signed into law in 2016 which includes the topic of electronic health records.  This provides immediate access to information in MyChart.  This includes consultation notes, operative notes, office notes, lab results and pathology reports.  If you have any questions about what you read please let us know at your next visit so we can discuss your concerns and take corrective action if need be.  We are right here with you.

## 2021-04-04 ENCOUNTER — Ambulatory Visit (INDEPENDENT_AMBULATORY_CARE_PROVIDER_SITE_OTHER): Payer: Medicare Other | Admitting: Family Medicine

## 2021-04-04 ENCOUNTER — Other Ambulatory Visit: Payer: Self-pay

## 2021-04-04 ENCOUNTER — Encounter (INDEPENDENT_AMBULATORY_CARE_PROVIDER_SITE_OTHER): Payer: Self-pay | Admitting: Family Medicine

## 2021-04-04 VITALS — BP 133/79 | HR 70 | Temp 97.5°F | Ht 69.0 in | Wt 261.0 lb

## 2021-04-04 DIAGNOSIS — K219 Gastro-esophageal reflux disease without esophagitis: Secondary | ICD-10-CM | POA: Diagnosis not present

## 2021-04-04 DIAGNOSIS — I10 Essential (primary) hypertension: Secondary | ICD-10-CM | POA: Diagnosis not present

## 2021-04-04 DIAGNOSIS — E639 Nutritional deficiency, unspecified: Secondary | ICD-10-CM

## 2021-04-04 DIAGNOSIS — Z6839 Body mass index (BMI) 39.0-39.9, adult: Secondary | ICD-10-CM | POA: Diagnosis not present

## 2021-04-05 NOTE — Progress Notes (Signed)
Chief Complaint:   OBESITY Tavari is here to discuss his progress with his obesity treatment plan along with follow-up of his obesity related diagnoses. Yoon is on the Category 3 Plan + 200 calories and states he is following his eating plan approximately 75-80% of the time. Donell states he is doing  0 minutes 0 times per week.  Today's visit was #: 3 Starting weight: 269 lbs Starting date: 03/01/2021 Today's weight: 261 lbs Today's date: 04/04/2021 Total lbs lost to date: 8 Total lbs lost since last in-office visit: 2  Interim History: Leanor Kail Spurr is here for a follow up office visit. We reviewed his meal plan and questions were answered. Patient's food recall appears to be accurate and consistent with what is on plan when he is following it. When eating on plan, his hunger and cravings are well controlled. Dandra notes occasional hunger around 4-5 PM but he occasionally skips lunch and is not eating all of his food at dinner. Instead he has lunch frozen meal for dinner.   Subjective:   1. Essential hypertension Toben's blood pressure is at goal today. He is taking hydrochlorothiazide, lisinopril, and metoprolol. Cardiovascular ROS: no chest pain or dyspnea on exertion.  BP Readings from Last 3 Encounters:  04/04/21 133/79  03/15/21 126/61  03/01/21 135/68   2. Inadequate dietary intake Kraig is skipping his protein and he is making some substitutions on the plan that are resulting in inadequate nutrition.  3. Gastroesophageal reflux disease, unspecified whether esophagitis present Rydge denies worsening symptoms. He is on Famotidine q daily. He notes improved symptoms if anything since being on the plan.  Assessment/Plan:  No orders of the defined types were placed in this encounter.   There are no discontinued medications.   No orders of the defined types were placed in this encounter.    1. Essential hypertension Aarian will continue his medications, and  will continue working on healthy weight loss and exercise to improve blood pressure control. We will watch for signs of hypotension as he continues his lifestyle modifications.  2. Inadequate dietary intake I explained the importance to the patient of eating all of his food on the plan and adequate alternatives were discussed.  3. Gastroesophageal reflux disease, unspecified whether esophagitis present Jarone will continue his prudent nutritional plan, decrease spicy/fatty food, late night eating, and watch his caffeine intake as well.  4. Obesity with current BMI of 38.6 Suyash is currently in the action stage of change. As such, his goal is to continue with weight loss efforts. He has agreed to the Category 3 Plan + 200 calories of 10:1 ratio items.   Gust will break up his lunch into 2 wraps/tortillas with 2-3 oz of [place] or roast beef on each. Also strategies to ensure he eats all of his food on the plan was discussed with the patient extensively.  Exercise goals: As is.  Behavioral modification strategies: increasing lean protein intake, decreasing simple carbohydrates, no skipping meals, and planning for success.  Abrham has agreed to follow-up with our clinic in 2 to 3 weeks. He was informed of the importance of frequent follow-up visits to maximize his success with intensive lifestyle modifications for his multiple health conditions.   Objective:   Blood pressure 133/79, pulse 70, temperature (!) 97.5 F (36.4 C), height 5\' 9"  (1.753 m), weight 261 lb (118.4 kg), SpO2 94 %. Body mass index is 38.54 kg/m.  General: Cooperative, alert, well developed, in no acute distress.  HEENT: Conjunctivae and lids unremarkable. Cardiovascular: Regular rhythm.  Lungs: Normal work of breathing. Neurologic: No focal deficits.   Lab Results  Component Value Date   CREATININE 1.23 03/01/2021   BUN 23 03/01/2021   NA 137 03/01/2021   K 4.4 03/01/2021   CL 97 03/01/2021   CO2 24  03/01/2021   Lab Results  Component Value Date   ALT 51 (H) 03/01/2021   AST 47 (H) 03/01/2021   ALKPHOS 64 03/01/2021   BILITOT 1.0 03/01/2021   Lab Results  Component Value Date   HGBA1C 5.8 (H) 03/01/2021   HGBA1C 5.8 10/29/2020   HGBA1C 5.9 11/04/2019   HGBA1C 6.0 08/14/2018   HGBA1C 5.8 07/17/2017   Lab Results  Component Value Date   INSULIN 35.6 (H) 03/01/2021   Lab Results  Component Value Date   TSH 2.130 03/01/2021   Lab Results  Component Value Date   CHOL 196 03/01/2021   HDL 44 03/01/2021   LDLCALC 130 (H) 03/01/2021   TRIG 121 03/01/2021   CHOLHDL 4 10/29/2020   No results found for: VD25OH Lab Results  Component Value Date   WBC 9.5 03/01/2021   HGB 15.8 03/01/2021   HCT 45.6 03/01/2021   MCV 94 03/01/2021   PLT 300 03/01/2021   No results found for: IRON, TIBC, FERRITIN  Obesity Behavioral Intervention:   Approximately 15 minutes were spent on the discussion below.  ASK: We discussed the diagnosis of obesity with Samuel [name] today and Bobby agreed to give [place] permission to discuss obesity behavioral modification therapy today.  ASSESS: Cortney has the diagnosis of obesity and his BMI today is 38.6. Yovanni is in the action stage of change.   ADVISE: Geraldine was educated on the multiple health risks of obesity as well as the benefit of weight loss to improve his health. He was advised of the need for long term treatment and the importance of lifestyle modifications to improve his current health and to decrease his risk of future health problems.  AGREE: Multiple dietary modification options and treatment options were discussed and Edmond agreed to follow the recommendations documented in the above note.  ARRANGE: George was educated on the importance of frequent visits to treat obesity as outlined per CMS and USPSTF guidelines and agreed to schedule his next follow up appointment today.  Attestation Statements:   Reviewed by clinician on day of  visit: allergies, medications, problem list, medical history, surgical history, family history, social history, and previous encounter notes.   Wilhemena Durie, am acting as transcriptionist for [employer], DO.  I have reviewed the above documentation for accuracy and completeness, and I agree with the above. Marjory Sneddon, D.O.  The [place] was signed into law in 2016 which includes the topic of electronic health records.  This provides immediate access to information in MyChart.  This includes consultation notes, operative notes, office notes, lab results and pathology reports.  If you have any questions about what you read please let us know at your next visit so we can discuss your concerns and take corrective action if need be.  We are right here with you.

## 2021-04-25 ENCOUNTER — Other Ambulatory Visit: Payer: Self-pay | Admitting: Podiatry

## 2021-05-02 ENCOUNTER — Encounter (INDEPENDENT_AMBULATORY_CARE_PROVIDER_SITE_OTHER): Payer: Self-pay

## 2021-05-03 ENCOUNTER — Encounter (INDEPENDENT_AMBULATORY_CARE_PROVIDER_SITE_OTHER): Payer: Self-pay | Admitting: Adult Health

## 2021-05-03 ENCOUNTER — Other Ambulatory Visit: Payer: Self-pay

## 2021-05-03 ENCOUNTER — Ambulatory Visit (INDEPENDENT_AMBULATORY_CARE_PROVIDER_SITE_OTHER): Payer: Medicare Other | Admitting: Adult Health

## 2021-05-03 VITALS — BP 128/62 | HR 55 | Temp 98.1°F | Ht 69.0 in | Wt 258.0 lb

## 2021-05-03 DIAGNOSIS — R7303 Prediabetes: Secondary | ICD-10-CM | POA: Diagnosis not present

## 2021-05-03 DIAGNOSIS — I10 Essential (primary) hypertension: Secondary | ICD-10-CM | POA: Diagnosis not present

## 2021-05-03 DIAGNOSIS — Z6839 Body mass index (BMI) 39.0-39.9, adult: Secondary | ICD-10-CM | POA: Diagnosis not present

## 2021-05-03 NOTE — Progress Notes (Signed)
Chief Complaint:   OBESITY TRUE is here to discuss his progress with his obesity treatment plan along with follow-up of his obesity related diagnoses. Samuel [name] is on the Category 3 Plan +200 calories and states he is following his eating plan approximately 75-80% of the time. Andrez states he is not exercising at this time.  Today's visit was #: 4 Starting weight: 269 lbs Starting date: 03/01/2021 Today's weight: 258 lbs Today's date: 05/03/2021 Total lbs lost to date: 11 lbs Total lbs lost since last in-office visit: 3 lbs  Interim History: Redding has enjoyed less right hip pain since starting the program.   Current right hip 4/10, previously 8/10. He is followed by [facility]. He continues to enjoy structure and foods on Cat 3 Meal Plan.  Subjective:   1. Essential hypertension BP/HR at goal at office visit. He denies tobacco/vape use. He is on lisinopril 20 mg daily, HCTZ 25 mg daily, metoprolol tartrate 50 mg daily.  2. Pre-diabetes A1c - 5.8 at last 2 checks. He denies family history of T2D. He has never been on every BG lowering medications. He denies polyphagia.  Assessment/Plan:   1. Essential hypertension Continue current antihypertensive therapy.  2. Pre-diabetes Continue increasing protein and decreasing sugar/carbohydrate intake.  3. Obesity with current BMI of 38.2  Kekai is currently in the action stage of change. As such, his goal is to continue with weight loss efforts. He has agreed to the Category 3 Plan +200 calories.   Exercise goals:  As is.  Behavioral modification strategies: increasing lean protein intake, decreasing simple carbohydrates, meal planning and cooking strategies, keeping healthy foods in the home, avoiding temptations, and planning for success.  Jarrod has agreed to follow-up with our clinic in 2 weeks. He was informed of the importance of frequent follow-up visits to maximize his success with intensive lifestyle  modifications for his multiple health conditions.   Objective:   Blood pressure 128/62, pulse (!) 55, temperature 98.1 F (36.7 C), height 5\' 9"  (9.678 m), weight 258 lb (117 kg), SpO2 98 %. Body mass index is 38.1 kg/m.  General: Cooperative, alert, well developed, in no acute distress. HEENT: Conjunctivae and lids unremarkable. Cardiovascular: Regular rhythm.  Lungs: Normal work of breathing. Neurologic: No focal deficits.   Lab Results  Component Value Date   CREATININE 1.23 03/01/2021   BUN 23 03/01/2021   NA 137 03/01/2021   K 4.4 03/01/2021   CL 97 03/01/2021   CO2 24 03/01/2021   Lab Results  Component Value Date   ALT 51 (H) 03/01/2021   AST 47 (H) 03/01/2021   ALKPHOS 64 03/01/2021   BILITOT 1.0 03/01/2021   Lab Results  Component Value Date   HGBA1C 5.8 (H) 03/01/2021   HGBA1C 5.8 10/29/2020   HGBA1C 5.9 11/04/2019   HGBA1C 6.0 08/14/2018   HGBA1C 5.8 07/17/2017   Lab Results  Component Value Date   INSULIN 35.6 (H) 03/01/2021   Lab Results  Component Value Date   TSH 2.130 03/01/2021   Lab Results  Component Value Date   CHOL 196 03/01/2021   HDL 44 03/01/2021   LDLCALC 130 (H) 03/01/2021   TRIG 121 03/01/2021   CHOLHDL 4 10/29/2020   Lab Results  Component Value Date   WBC 9.5 03/01/2021   HGB 15.8 03/01/2021   HCT 45.6 03/01/2021   MCV 94 03/01/2021   PLT 300 03/01/2021   Attestation Statements:   Reviewed by clinician on day of visit: allergies, medications,  problem list, medical history, surgical history, family history, social history, and previous encounter notes.  Time spent on visit including pre-visit chart review and post-visit care and charting was 28 minutes.   I, Water quality scientist, CMA, am acting as Location manager for Mina Marble, NP.  I have reviewed the above documentation for accuracy and completeness, and I agree with the above. -  Vamsi Apfel d. Kassiah Mccrory, NP-C

## 2021-05-13 ENCOUNTER — Ambulatory Visit (INDEPENDENT_AMBULATORY_CARE_PROVIDER_SITE_OTHER): Payer: Medicare Other | Admitting: Family Medicine

## 2021-05-13 ENCOUNTER — Encounter: Payer: Self-pay | Admitting: Family Medicine

## 2021-05-13 ENCOUNTER — Other Ambulatory Visit: Payer: Self-pay

## 2021-05-13 VITALS — BP 110/64 | HR 52 | Temp 98.2°F | Wt 260.4 lb

## 2021-05-13 DIAGNOSIS — M7021 Olecranon bursitis, right elbow: Secondary | ICD-10-CM | POA: Diagnosis not present

## 2021-05-13 DIAGNOSIS — M109 Gout, unspecified: Secondary | ICD-10-CM | POA: Diagnosis not present

## 2021-05-13 DIAGNOSIS — M25521 Pain in right elbow: Secondary | ICD-10-CM

## 2021-05-13 MED ORDER — PREDNISONE 10 MG PO TABS: ORAL_TABLET | ORAL | 0 refills | Status: DC

## 2021-05-13 NOTE — Progress Notes (Signed)
Subjective:    Patient ID: Samuel [name], male    DOB: 1951-01-28, 70 y.o.   MRN: [id number]  Chief Complaint  Patient presents with   Elbow Injury    Pain started Tuesday, has gotten a little worse each day since. Has not taken anything. May have strained it monday night opening a bottle of wine.  Ulnar nerve    HPI Patient was seen today for acute concern.  Patient endorses right elbow pain Monday night after opening a bottle of wine.  Patient states the pain became progressively worse.  Unable to fully bend elbow.  Has not tried anything for symptoms.  Denies any heavy lifting, pushing pulling.  Patient is right-handed.  Patient also notes gout flare in right foot.  Has colchicine at home but has not started taking it.  Not drinking much water daily.  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   severe- right anterior shoulder- (EXC)   Atypical nevus 07/18/2011   mild-mod-right upper abdomen   Back pain    Basal cell carcinoma 07/17/2019   nod-left forearm-([id number])   COVID 01/07/2021   EPISTAXIS, RECURRENT 12/21/2009   ESSENTIAL HYPERTENSION 12/21/2009   Hypertension    Joint pain    Other fatigue    Prediabetes    Retinal detachment    OD   Shortness of breath on exertion    VENTRICULAR HYPERTROPHY, LEFT 01/04/2010    Allergies  Allergen Reactions   Penicillins     REACTION: hives    ROS General: Denies fever, chills, night sweats, changes in weight, changes in appetite HEENT: Denies headaches, ear pain, changes in vision, rhinorrhea, sore throat CV: Denies CP, palpitations, SOB, orthopnea Pulm: Denies SOB, cough, wheezing GI: Denies abdominal pain, nausea, vomiting, diarrhea, constipation GU: Denies dysuria, hematuria, frequency Msk: Denies muscle cramps, joint pains + right elbow pain, right foot pain Neuro: Denies weakness, numbness, tingling Skin: Denies rashes,  bruising + erythema and edema of right elbow Psych: Denies depression, anxiety, hallucination     Objective:    Blood pressure 110/64, pulse (!) 52, temperature 98.2 F (36.8 C), temperature source Oral, weight 260 lb 6.4 oz (118.1 kg), SpO2 98 %.  Gen. Pleasant, well-nourished, in no distress, normal affect   HEENT: [place]/AT, face symmetric, conjunctiva clear, no scleral icterus, PERRLA, EOMI, nares patent without drainage Lungs: no accessory muscle use Cardiovascular: RRR, no peripheral edema Musculoskeletal: Mild edema and erythema of right elbow.  TTP of lateral right elbow distal to lateral epicondyle.  No numbness or tingling with tapping on lateral or medial epicondyles.  No effusion of right elbow noted.  No deformities, no cyanosis or clubbing, normal tone Neuro:  A&Ox3, CN II-XII intact, normal gait Skin:  Warm, no lesions/ rash   Wt Readings from Last 3 Encounters:  05/13/21 260 lb 6.4 oz (118.1 kg)  05/03/21 258 lb (117 kg)  04/04/21 261 lb (118.4 kg)    Lab Results  Component Value Date   WBC 9.5 03/01/2021   HGB 15.8 03/01/2021   HCT 45.6 03/01/2021   PLT 300 03/01/2021   GLUCOSE 123 (H) 03/01/2021   CHOL 196 03/01/2021   TRIG 121 03/01/2021   HDL 44 03/01/2021   LDLCALC 130 (H) 03/01/2021   ALT 51 (H) 03/01/2021   AST 47 (H) 03/01/2021   NA 137 03/01/2021   K 4.4 03/01/2021   CL 97 03/01/2021  CREATININE 1.23 03/01/2021   BUN 23 03/01/2021   CO2 24 03/01/2021   TSH 2.130 03/01/2021   PSA 1.42 10/29/2020   HGBA1C 5.8 (H) 03/01/2021    Assessment/Plan:  Acute gout of right foot, unspecified cause -Discussed increasing p.o. intake of water and fluids -Patient has colchicine but did not start.  We will send in Rx for prednisone given elbow bursitis. -Discussed diet modifications.  Given handout  - Plan: predniSONE (DELTASONE) 10 MG tablet  Olecranon bursitis of right elbow -Discussed various causes of elbow pain and edema including overuse, gout  flare -Discussed supportive care including compression, ice, stretching exercises, topical analgesics, NSAIDs, Tylenol, steroids -We will start prednisone taper -Given handout -Given precautions - Plan: predniSONE (DELTASONE) 10 MG tablet  Right elbow pain -Discussed various causes of elbow pain and edema including bursitis, gout flare, muscle strain -Discussed supportive care including ice, heat, hydration, stretching, topical analgesics such as Aspercreme, NSAIDs or Tylenol as needed.  F/u as needed  Grier Mitts, MD

## 2021-05-18 ENCOUNTER — Other Ambulatory Visit: Payer: Self-pay

## 2021-05-18 ENCOUNTER — Ambulatory Visit (INDEPENDENT_AMBULATORY_CARE_PROVIDER_SITE_OTHER): Payer: Medicare Other | Admitting: Family Medicine

## 2021-05-18 ENCOUNTER — Encounter (INDEPENDENT_AMBULATORY_CARE_PROVIDER_SITE_OTHER): Payer: Self-pay | Admitting: Family Medicine

## 2021-05-18 VITALS — BP 125/68 | HR 44 | Temp 97.8°F | Ht 69.0 in | Wt 253.0 lb

## 2021-05-18 DIAGNOSIS — Z6839 Body mass index (BMI) 39.0-39.9, adult: Secondary | ICD-10-CM | POA: Diagnosis not present

## 2021-05-18 DIAGNOSIS — I10 Essential (primary) hypertension: Secondary | ICD-10-CM

## 2021-05-18 DIAGNOSIS — M159 Polyosteoarthritis, unspecified: Secondary | ICD-10-CM | POA: Diagnosis not present

## 2021-05-18 DIAGNOSIS — K219 Gastro-esophageal reflux disease without esophagitis: Secondary | ICD-10-CM | POA: Diagnosis not present

## 2021-05-19 NOTE — Progress Notes (Signed)
Chief Complaint:   OBESITY Samuel [name] is here to discuss his progress with his obesity treatment plan along with follow-up of his obesity related diagnoses. Samuel [name] is on the Category 3 Plan + 200 calories and states he is following his eating plan approximately 75-80% of the time. Samuel [name] states he is doing 0 minutes 0 times per week.  Today's visit was #: 5 Starting weight: 269 lbs Starting date: 03/01/2021 Today's weight: 253 lbs Today's date: 05/18/2021 Total lbs lost to date: 16 Total lbs lost since last in-office visit: 5  Interim History: Samuel [name] is here for a follow up office visit. We reviewed his meal plan and questions were answered. Patient's food recall appears to be accurate and consistent with what is on plan when he is following it. When eating on plan, his hunger and cravings are well controlled. Samuel [name] denies issues, but he is no measuring his snacks, cheese, peanut butter, etc. He desires larger numbers of weight loss.  Subjective:   1. Essential hypertension Samuel [name] is on several blood pressure medications. His blood pressure is at goal. He is asymptomatic and denies dizziness or concerns.   BP Readings from Last 3 Encounters:  05/18/21 125/68  05/13/21 110/64  05/03/21 128/62   2. Gastroesophageal reflux disease, unspecified whether esophagitis present Samuel [name] takes Pepcid, and he denies issues. He notes his symptoms have decreased since starting his meal plan.  3. Generalized osteoarthritis Samuel [name] has hip and knee pain, and it hinders his ability to exercise.  Assessment/Plan:  No orders of the defined types were placed in this encounter.   There are no discontinued medications.   No orders of the defined types were placed in this encounter.    1. Essential hypertension Samuel [name] will continue working on healthy weight loss and exercise to improve blood pressure control. We will watch for signs of hypotension as he continues his lifestyle  modifications.  2. Gastroesophageal reflux disease, unspecified whether esophagitis present Intensive lifestyle modifications are the first line treatment for this issue. We discussed several lifestyle modifications today. Samuel [name] will continue his medications, prudent nutritional plan, exercise and weight loss efforts. Orders and follow up as documented in patient record.   Counseling If a person has gastroesophageal reflux disease (GERD), food and stomach acid move back up into the esophagus and cause symptoms or problems such as damage to the esophagus. Anti-reflux measures include: raising the head of the bed, avoiding tight clothing or belts, avoiding eating late at night, not lying down shortly after mealtime, and achieving weight loss. Avoid ASA, NSAID's, caffeine, alcohol, and tobacco.  OTC Pepcid and/or Tums are often very helpful for as needed use.  However, for persisting chronic or daily symptoms, stronger medications like Omeprazole may be needed. You may need to avoid foods and drinks such as: Coffee and tea (with or without caffeine). Drinks that contain alcohol. Energy drinks and sports drinks. Bubbly (carbonated) drinks or sodas. Chocolate and cocoa. Peppermint and mint flavorings. Garlic and onions. Horseradish. Spicy and acidic foods. These include peppers, chili powder, curry powder, vinegar, hot sauces, and BBQ sauce. Citrus fruit juices and citrus fruits, such as oranges, lemons, and limes. Tomato-based foods. These include red sauce, chili, salsa, and pizza with red sauce. Fried and fatty foods. These include donuts, french fries, potato chips, and high-fat dressings. High-fat meats. These include hot dogs, rib eye steak, sausage, ham, and bacon.  3. Generalized osteoarthritis Samuel [name] will continue glucosamine-chondroitin and indomethacin. He will continue with weight loss,  prudent nutritional plan, and try to walk 5-10 minutes every day as tolerated.  4. Obesity with  current BMI of 37.4 Samuel [name] is currently in the action stage of change. As such, his goal is to continue with weight loss efforts. He has agreed to the Category 3 Plan + 200 calories.   Samuel [name] is to measure his 200 calorie snack per day.  Exercise goals: All adults should avoid inactivity. Some physical activity is better than none, and adults who participate in any amount of physical activity gain some health benefits.  Behavioral modification strategies: increasing lean protein intake, decreasing simple carbohydrates, increasing water intake, decreasing liquid calories, and decreasing alcohol intake.  Samuel [name] has agreed to follow-up with our clinic in 2 to 3 weeks. He was informed of the importance of frequent follow-up visits to maximize his success with intensive lifestyle modifications for his multiple health conditions.   Objective:   Blood pressure 125/68, pulse (!) 44, temperature 97.8 F (36.6 C), height 5\' 9"  (1.753 m), weight 253 lb (114.8 kg), SpO2 97 %. Body mass index is 37.36 kg/m.  General: Cooperative, alert, well developed, in no acute distress. HEENT: Conjunctivae and lids unremarkable. Cardiovascular: Regular rhythm.  Lungs: Normal work of breathing. Neurologic: No focal deficits.   Lab Results  Component Value Date   CREATININE 1.23 03/01/2021   BUN 23 03/01/2021   NA 137 03/01/2021   K 4.4 03/01/2021   CL 97 03/01/2021   CO2 24 03/01/2021   Lab Results  Component Value Date   ALT 51 (H) 03/01/2021   AST 47 (H) 03/01/2021   ALKPHOS 64 03/01/2021   BILITOT 1.0 03/01/2021   Lab Results  Component Value Date   HGBA1C 5.8 (H) 03/01/2021   HGBA1C 5.8 10/29/2020   HGBA1C 5.9 11/04/2019   HGBA1C 6.0 08/14/2018   HGBA1C 5.8 07/17/2017   Lab Results  Component Value Date   INSULIN 35.6 (H) 03/01/2021   Lab Results  Component Value Date   TSH 2.130 03/01/2021   Lab Results  Component Value Date   CHOL 196 03/01/2021   HDL 44 03/01/2021   LDLCALC  130 (H) 03/01/2021   TRIG 121 03/01/2021   CHOLHDL 4 10/29/2020   No results found for: VD25OH Lab Results  Component Value Date   WBC 9.5 03/01/2021   HGB 15.8 03/01/2021   HCT 45.6 03/01/2021   MCV 94 03/01/2021   PLT 300 03/01/2021   No results found for: IRON, TIBC, FERRITIN  Obesity Behavioral Intervention:   Approximately 15 minutes were spent on the discussion below.  ASK: We discussed the diagnosis of obesity with Samuel [name] today and Samuel [name] agreed to give [place] permission to discuss obesity behavioral modification therapy today.  ASSESS: Samuel [name] has the diagnosis of obesity and his BMI today is 37.4. Samuel [name] is in the action stage of change.   ADVISE: Samuel [name] was educated on the multiple health risks of obesity as well as the benefit of weight loss to improve his health. He was advised of the need for long term treatment and the importance of lifestyle modifications to improve his current health and to decrease his risk of future health problems.  AGREE: Multiple dietary modification options and treatment options were discussed and Samuel [name] agreed to follow the recommendations documented in the above note.  ARRANGE: Samuel [name] was educated on the importance of frequent visits to treat obesity as outlined per CMS and USPSTF guidelines and agreed to schedule his next follow up appointment today.  Attestation Statements:  Reviewed by clinician on day of visit: allergies, medications, problem list, medical history, surgical history, family history, social history, and previous encounter notes.   Wilhemena Durie, am acting as transcriptionist for [employer], DO.  I have reviewed the above documentation for accuracy and completeness, and I agree with the above. Marjory Sneddon, D.O.  The [place] was signed into law in 2016 which includes the topic of electronic health records.  This provides immediate access to information in MyChart.  This includes  consultation notes, operative notes, office notes, lab results and pathology reports.  If you have any questions about what you read please let us know at your next visit so we can discuss your concerns and take corrective action if need be.  We are right here with you.

## 2021-06-06 ENCOUNTER — Other Ambulatory Visit: Payer: Self-pay | Admitting: Family Medicine

## 2021-06-12 ENCOUNTER — Other Ambulatory Visit: Payer: Self-pay | Admitting: Podiatry

## 2021-06-13 NOTE — Telephone Encounter (Signed)
Please advise 

## 2021-06-16 ENCOUNTER — Encounter (INDEPENDENT_AMBULATORY_CARE_PROVIDER_SITE_OTHER): Payer: Self-pay | Admitting: Family Medicine

## 2021-06-16 ENCOUNTER — Other Ambulatory Visit: Payer: Self-pay

## 2021-06-16 ENCOUNTER — Ambulatory Visit (INDEPENDENT_AMBULATORY_CARE_PROVIDER_SITE_OTHER): Payer: Medicare Other | Admitting: Family Medicine

## 2021-06-16 VITALS — BP 133/76 | HR 61 | Temp 98.1°F | Ht 69.0 in | Wt 249.0 lb

## 2021-06-16 DIAGNOSIS — M109 Gout, unspecified: Secondary | ICD-10-CM

## 2021-06-16 DIAGNOSIS — I10 Essential (primary) hypertension: Secondary | ICD-10-CM | POA: Diagnosis not present

## 2021-06-16 DIAGNOSIS — Z6839 Body mass index (BMI) 39.0-39.9, adult: Secondary | ICD-10-CM | POA: Diagnosis not present

## 2021-06-16 DIAGNOSIS — R7303 Prediabetes: Secondary | ICD-10-CM

## 2021-06-16 DIAGNOSIS — K760 Fatty (change of) liver, not elsewhere classified: Secondary | ICD-10-CM

## 2021-06-16 DIAGNOSIS — E7849 Other hyperlipidemia: Secondary | ICD-10-CM

## 2021-06-16 DIAGNOSIS — K219 Gastro-esophageal reflux disease without esophagitis: Secondary | ICD-10-CM | POA: Diagnosis not present

## 2021-06-20 NOTE — Progress Notes (Signed)
Chief Complaint:   OBESITY Samuel [name] is here to discuss his progress with his obesity treatment plan along with follow-up of his obesity related diagnoses. Samuel [name] is on the Category 3 Plan + 200 calories and states he is following his eating plan approximately 75-80% of the time. Samuel [name] states he is not currently exercising.  Today's visit was #: 6 Starting weight: 269 lbs Starting date: 03/01/2021 Today's weight: 249 lbs Today's date: 06/16/2021 Total lbs lost to date: 20 Total lbs lost since last in-office visit: 4  Interim History: Samuel [name] is here for a follow up office visit.  We reviewed his meal plan and questions were answered.  Patient's food recall appears to be accurate and consistent with what is on plan when he is following it.   When eating on plan, his hunger and cravings are well controlled.   Pt reports some hunger/cravings but only when he sees commercials of foods, otherwise controlled.  Subjective:   1. Gastroesophageal reflux disease, unspecified whether esophagitis present Pt reports less indigestion. He is doing better with eating healthier. Medication: Pepcid  2. Essential hypertension Samuel [name] is tolerating medication(s) well without side effects.  Medication compliance is good and patient appears to be taking it as prescribed.  Denies additional concerns regarding this condition. Medication: HCTZ, Zestril, metoprolol  3. Gouty arthropathy Pt has no active concerns or issues. Symptoms are better controlled with healthier eating. Medication: colchicine, Indocin  Assessment/Plan:  No orders of the defined types were placed in this encounter.   There are no discontinued medications.   No orders of the defined types were placed in this encounter.    1. Gastroesophageal reflux disease, unspecified whether esophagitis present Continue to avoid triggers. Continue with weight loss via prudent nutritional plan. Intensive lifestyle modifications are  the first line treatment for this issue. We discussed several lifestyle modifications today and he will continue to work on diet, exercise and weight loss efforts. Orders and follow up as documented in patient record.   Counseling If a person has gastroesophageal reflux disease (GERD), food and stomach acid move back up into the esophagus and cause symptoms or problems such as damage to the esophagus. Anti-reflux measures include: raising the head of the bed, avoiding tight clothing or belts, avoiding eating late at night, not lying down shortly after mealtime, and achieving weight loss. Avoid ASA, NSAID's, caffeine, alcohol, and tobacco.  OTC Pepcid and/or Tums are often very helpful for as needed use.  However, for persisting chronic or daily symptoms, stronger medications like Omeprazole may be needed. You may need to avoid foods and drinks such as: Coffee and tea (with or without caffeine). Drinks that contain alcohol. Energy drinks and sports drinks. Bubbly (carbonated) drinks or sodas. Chocolate and cocoa. Peppermint and mint flavorings. Garlic and onions. Horseradish. Spicy and acidic foods. These include peppers, chili powder, curry powder, vinegar, hot sauces, and BBQ sauce. Citrus fruit juices and citrus fruits, such as oranges, lemons, and limes. Tomato-based foods. These include red sauce, chili, salsa, and pizza with red sauce. Fried and fatty foods. These include donuts, french fries, potato chips, and high-fat dressings. High-fat meats. These include hot dogs, rib eye steak, sausage, ham, and bacon.  2. Essential hypertension At goal and stable. Rollyn is working on healthy weight loss and exercise to improve blood pressure control. We will watch for signs of hypotension as he continues his lifestyle modifications. Continue current treatment plan.  3. Gouty arthropathy Symptoms stable. Increase  activity/walking as tolerated.  4. Obesity BMI today is 70  Samuel [name] is  currently in the action stage of change. As such, his goal is to continue with weight loss efforts. He has agreed to the Category 3 Plan + 200 calories.   Exercise goals: All adults should avoid inactivity. Some physical activity is better than none, and adults who participate in any amount of physical activity gain some health benefits. Pt advised to start walking.  Behavioral modification strategies: increasing lean protein intake, decreasing simple carbohydrates, and planning for success.  [name] has agreed to follow-up with our clinic in 2-4 weeks. He was informed of the importance of frequent follow-up visits to maximize his success with intensive lifestyle modifications for his multiple health conditions.   Objective:   Blood pressure 133/76, pulse 61, temperature 98.1 F (36.7 C), height 5\' 9"  (1.753 m), weight 249 lb (112.9 kg), SpO2 100 %. Body mass index is 36.77 kg/m.  General: Cooperative, alert, well developed, in no acute distress. HEENT: Conjunctivae and lids unremarkable. Cardiovascular: Regular rhythm.  Lungs: Normal work of breathing. Neurologic: No focal deficits.   Lab Results  Component Value Date   CREATININE 1.23 03/01/2021   BUN 23 03/01/2021   NA 137 03/01/2021   K 4.4 03/01/2021   CL 97 03/01/2021   CO2 24 03/01/2021   Lab Results  Component Value Date   ALT 51 (H) 03/01/2021   AST 47 (H) 03/01/2021   ALKPHOS 64 03/01/2021   BILITOT 1.0 03/01/2021   Lab Results  Component Value Date   HGBA1C 5.8 (H) 03/01/2021   HGBA1C 5.8 10/29/2020   HGBA1C 5.9 11/04/2019   HGBA1C 6.0 08/14/2018   HGBA1C 5.8 07/17/2017   Lab Results  Component Value Date   INSULIN 35.6 (H) 03/01/2021   Lab Results  Component Value Date   TSH 2.130 03/01/2021   Lab Results  Component Value Date   CHOL 196 03/01/2021   HDL 44 03/01/2021   LDLCALC 130 (H) 03/01/2021   TRIG 121 03/01/2021   CHOLHDL 4 10/29/2020   No results found for: VD25OH Lab Results  Component  Value Date   WBC 9.5 03/01/2021   HGB 15.8 03/01/2021   HCT 45.6 03/01/2021   MCV 94 03/01/2021   PLT 300 03/01/2021   No results found for: IRON, TIBC, FERRITIN  Obesity Behavioral Intervention:   Approximately 15 minutes were spent on the discussion below.  ASK: We discussed the diagnosis of obesity with [name] today and Christhoper agreed to give [place] permission to discuss obesity behavioral modification therapy today.  ASSESS: Bodi has the diagnosis of obesity and his BMI today is 36.8. Norval is in the action stage of change.   ADVISE: Orry was educated on the multiple health risks of obesity as well as the benefit of weight loss to improve his health. He was advised of the need for long term treatment and the importance of lifestyle modifications to improve his current health and to decrease his risk of future health problems.  AGREE: Multiple dietary modification options and treatment options were discussed and Preet agreed to follow the recommendations documented in the above note.  ARRANGE: Rahshawn was educated on the importance of frequent visits to treat obesity as outlined per CMS and USPSTF guidelines and agreed to schedule his next follow up appointment today.  Attestation Statements:   Reviewed by clinician on day of visit: allergies, medications, problem list, medical history, surgical history, family history, social history, and previous encounter notes.  Coral Ceo, CMA, am acting as transcriptionist for [employer], DO.  I have reviewed the above documentation for accuracy and completeness, and I agree with the above. Marjory Sneddon, D.O.  The [place] was signed into law in 2016 which includes the topic of electronic health records.  This provides immediate access to information in MyChart.  This includes consultation notes, operative notes, office notes, lab results and pathology reports.  If you have any questions about what you  read please let us know at your next visit so we can discuss your concerns and take corrective action if need be.  We are right here with you.

## 2021-07-19 ENCOUNTER — Encounter: Payer: Self-pay | Admitting: Physician Assistant

## 2021-07-19 ENCOUNTER — Other Ambulatory Visit: Payer: Self-pay

## 2021-07-19 ENCOUNTER — Ambulatory Visit (INDEPENDENT_AMBULATORY_CARE_PROVIDER_SITE_OTHER): Payer: Medicare Other | Admitting: Physician Assistant

## 2021-07-19 DIAGNOSIS — Z1283 Encounter for screening for malignant neoplasm of skin: Secondary | ICD-10-CM

## 2021-07-19 DIAGNOSIS — Z85828 Personal history of other malignant neoplasm of skin: Secondary | ICD-10-CM

## 2021-07-19 DIAGNOSIS — L821 Other seborrheic keratosis: Secondary | ICD-10-CM | POA: Diagnosis not present

## 2021-07-19 NOTE — Progress Notes (Signed)
° °  Follow-Up Visit   Subjective  Samuel [name] is a 71 y.o. male who presents for the following: Annual Exam (Pt here for annual skin exam. Pt has hx of bcc, no family hx of melanoma or non melanoma skin cancers  ).   The following portions of the chart were reviewed this encounter and updated as appropriate:  Tobacco   Allergies   Meds   Problems   Med Hx   Surg Hx   Fam Hx       Objective  Well appearing patient in no apparent distress; mood and affect are within normal limits.  All skin waist up examined.  Brown to skin toned papules and plaques.   Assessment & Plan  Seborrheic keratosis  Observe  No atypical nevi noted at the time of the visit.  I, Marlette Curvin, PA-C, have reviewed all documentation's for this visit.  The documentation on 07/19/21 for the exam, diagnosis, procedures and orders are all accurate and complete.

## 2021-07-21 ENCOUNTER — Other Ambulatory Visit: Payer: Self-pay

## 2021-07-21 ENCOUNTER — Ambulatory Visit (INDEPENDENT_AMBULATORY_CARE_PROVIDER_SITE_OTHER): Payer: Medicare Other | Admitting: Family Medicine

## 2021-07-21 ENCOUNTER — Encounter (INDEPENDENT_AMBULATORY_CARE_PROVIDER_SITE_OTHER): Payer: Self-pay | Admitting: Family Medicine

## 2021-07-21 VITALS — BP 130/63 | HR 54 | Temp 98.1°F | Ht 69.0 in | Wt 251.0 lb

## 2021-07-21 DIAGNOSIS — K760 Fatty (change of) liver, not elsewhere classified: Secondary | ICD-10-CM

## 2021-07-21 DIAGNOSIS — I10 Essential (primary) hypertension: Secondary | ICD-10-CM | POA: Diagnosis not present

## 2021-07-21 DIAGNOSIS — R7303 Prediabetes: Secondary | ICD-10-CM

## 2021-07-21 DIAGNOSIS — Z6837 Body mass index (BMI) 37.0-37.9, adult: Secondary | ICD-10-CM | POA: Diagnosis not present

## 2021-07-21 DIAGNOSIS — E7849 Other hyperlipidemia: Secondary | ICD-10-CM

## 2021-07-21 NOTE — Addendum Note (Signed)
Addended by: Marjory Sneddon on: 07/21/2021 08:43 AM   Modules accepted: Orders

## 2021-07-22 LAB — HEMOGLOBIN A1C
Est. average glucose Bld gHb Est-mCnc: 114 mg/dL
Hgb A1c MFr Bld: 5.6 % (ref 4.8–5.6)

## 2021-07-22 LAB — COMPREHENSIVE METABOLIC PANEL
ALT: 28 IU/L (ref 0–44)
AST: 30 IU/L (ref 0–40)
Albumin/Globulin Ratio: 2 (ref 1.2–2.2)
Albumin: 4.6 g/dL (ref 3.8–4.8)
Alkaline Phosphatase: 65 IU/L (ref 44–121)
BUN/Creatinine Ratio: 29 — ABNORMAL HIGH (ref 10–24)
BUN: 36 mg/dL — ABNORMAL HIGH (ref 8–27)
Bilirubin Total: 0.9 mg/dL (ref 0.0–1.2)
CO2: 22 mmol/L (ref 20–29)
Calcium: 9.8 mg/dL (ref 8.6–10.2)
Chloride: 105 mmol/L (ref 96–106)
Creatinine, Ser: 1.25 mg/dL (ref 0.76–1.27)
Globulin, Total: 2.3 g/dL (ref 1.5–4.5)
Glucose: 119 mg/dL — ABNORMAL HIGH (ref 70–99)
Potassium: 4.8 mmol/L (ref 3.5–5.2)
Sodium: 141 mmol/L (ref 134–144)
Total Protein: 6.9 g/dL (ref 6.0–8.5)
eGFR: 63 mL/min/{1.73_m2} (ref >59)

## 2021-07-22 LAB — LIPID PANEL
Chol/HDL Ratio: 2.5 ratio (ref 0.0–5.0)
Cholesterol, Total: 127 mg/dL (ref 100–199)
HDL: 50 mg/dL (ref >39)
LDL Chol Calc (NIH): 61 mg/dL (ref 0–99)
Triglycerides: 82 mg/dL (ref 0–149)
VLDL Cholesterol Cal: 16 mg/dL (ref 5–40)

## 2021-07-22 LAB — INSULIN, RANDOM: INSULIN: 23.9 u[IU]/mL (ref 2.6–24.9)

## 2021-07-25 NOTE — Progress Notes (Signed)
Chief Complaint:   OBESITY Samuel [name] is here to discuss his progress with his obesity treatment plan along with follow-up of his obesity related diagnoses. Samuel [name] is on the Category 3 Plan + 200 calories of 10:1 ratio and states he is following his eating plan approximately 75-80% of the time. Samuel [name] states he is not currently exercising.  Today's visit was #: 7 Starting weight: 269 lbs Starting date: 03/01/2021 Today's weight: 251 lbs Today's date: 07/21/2021 Total lbs lost to date: 18 Total lbs lost since last in-office visit: +2  Interim History: Samuel [name] wasn't on plan over the holidays, but pt is surprised her gained. He did more snacking, off plan eating, and alcohol than usual.  Subjective:   1. Prediabetes Samuel [name] denies issues with carb cravings. He is not on meds.  2. Essential hypertension BP at goal.   3. Other hyperlipidemia Samuel [name] has hyperlipidemia and has been trying to improve his cholesterol levels with intensive lifestyle modification including a low saturated fat diet, exercise and weight loss. He denies any chest pain, claudication or myalgias. Medication: None  4. NAFLD (nonalcoholic fatty liver disease) Samuel [name] has a new dx of elevated ALT. His BMI is over 40. He denies abdominal pain or jaundice and has never been told of any liver problems in the past. He denies excessive alcohol intake.  Assessment/Plan:   Orders Placed This Encounter  Procedures   Comprehensive metabolic panel   Hemoglobin A1c   Insulin, random   Lipid panel    Medications Discontinued During This Encounter  Medication Reason   Apoaequorin (PREVAGEN PO)    predniSONE (DELTASONE) 10 MG tablet      No orders of the defined types were placed in this encounter.    1. Prediabetes Samuel [name] will continue to work on weight loss, exercise, and decreasing simple carbohydrates to help decrease the risk of diabetes. Check labs today.  - Hemoglobin A1c - Insulin, random  2. Essential  hypertension Samuel [name] is working on healthy weight loss and exercise to improve blood pressure control. We will watch for signs of hypotension as he continues his lifestyle modifications. Check labs today.  - Comprehensive metabolic panel  3. Other hyperlipidemia Cardiovascular risk and specific lipid/LDL goals reviewed.  We discussed several lifestyle modifications today and Samuel [name] will continue to work on diet, exercise and weight loss efforts. Orders and follow up as documented in patient record.   Counseling Intensive lifestyle modifications are the first line treatment for this issue. Dietary changes: Increase soluble fiber. Decrease simple carbohydrates. Exercise changes: Moderate to vigorous-intensity aerobic activity 150 minutes per week if tolerated. Lipid-lowering medications: see documented in medical record. Check labs today.  - Lipid panel  4. NAFLD (nonalcoholic fatty liver disease) We discussed the likely diagnosis of non-alcoholic fatty liver disease today and how this condition is obesity related. Samuel [name] was educated the importance of weight loss. Samuel [name] agreed to continue with his weight loss efforts with healthier diet and exercise as an essential part of his treatment plan. Check labs today.  - Comprehensive metabolic panel  5. Obesity with current BMI of 37.2  Samuel [name] is currently in the action stage of change. As such, his goal is to continue with weight loss efforts. He has agreed to the Category 3 Plan with 200 calories of 10:1 ratio items.   Exercise goals: All adults should avoid inactivity. Some physical activity is better than none, and adults who participate in any amount of physical activity gain some health benefits. Pt is  to start walking some.  Behavioral modification strategies: meal planning and cooking strategies and avoiding temptations.  Samuel [name] has agreed to follow-up with our clinic in 2-4 weeks. He was informed of the importance of frequent follow-up  visits to maximize his success with intensive lifestyle modifications for his multiple health conditions.   Samuel [name] was informed we would discuss his lab results at his next visit unless there is a critical issue that needs to be addressed sooner. Samuel [name] agreed to keep his next visit at the agreed upon time to discuss these results.  Objective:   Blood pressure 130/63, pulse (!) 54, temperature 98.1 F (36.7 C), height 5\' 9"  (1.753 m), weight 251 lb (113.9 kg), SpO2 98 %. Body mass index is 37.07 kg/m.  General: Cooperative, alert, well developed, in no acute distress. HEENT: Conjunctivae and lids unremarkable. Cardiovascular: Regular rhythm.  Lungs: Normal work of breathing. Neurologic: No focal deficits.   Lab Results  Component Value Date   CREATININE 1.25 07/21/2021   BUN 36 (H) 07/21/2021   NA 141 07/21/2021   K 4.8 07/21/2021   CL 105 07/21/2021   CO2 22 07/21/2021   Lab Results  Component Value Date   ALT 28 07/21/2021   AST 30 07/21/2021   ALKPHOS 65 07/21/2021   BILITOT 0.9 07/21/2021   Lab Results  Component Value Date   HGBA1C 5.6 07/21/2021   HGBA1C 5.8 (H) 03/01/2021   HGBA1C 5.8 10/29/2020   HGBA1C 5.9 11/04/2019   HGBA1C 6.0 08/14/2018   Lab Results  Component Value Date   INSULIN 23.9 07/21/2021   INSULIN 35.6 (H) 03/01/2021   Lab Results  Component Value Date   TSH 2.130 03/01/2021   Lab Results  Component Value Date   CHOL 127 07/21/2021   HDL 50 07/21/2021   LDLCALC 61 07/21/2021   TRIG 82 07/21/2021   CHOLHDL 2.5 07/21/2021   No results found for: VD25OH Lab Results  Component Value Date   WBC 9.5 03/01/2021   HGB 15.8 03/01/2021   HCT 45.6 03/01/2021   MCV 94 03/01/2021   PLT 300 03/01/2021   No results found for: IRON, TIBC, FERRITIN  Obesity Behavioral Intervention:   Approximately 15 minutes were spent on the discussion below.  ASK: We discussed the diagnosis of obesity with Samuel [name] today and Samuel [name] agreed to give [place]  permission to discuss obesity behavioral modification therapy today.  ASSESS: Samuel [name] has the diagnosis of obesity and his BMI today is 37.2. Samuel [name] is in the action stage of change.   ADVISE: Samuel [name] was educated on the multiple health risks of obesity as well as the benefit of weight loss to improve his health. He was advised of the need for long term treatment and the importance of lifestyle modifications to improve his current health and to decrease his risk of future health problems.  AGREE: Multiple dietary modification options and treatment options were discussed and Samuel [name] agreed to follow the recommendations documented in the above note.  ARRANGE: Samuel [name] was educated on the importance of frequent visits to treat obesity as outlined per CMS and USPSTF guidelines and agreed to schedule his next follow up appointment today.  Attestation Statements:   Reviewed by clinician on day of visit: allergies, medications, problem list, medical history, surgical history, family history, social history, and previous encounter notes.  Coral Ceo, CMA, am acting as transcriptionist for [employer], DO.  I have reviewed the above documentation for accuracy and completeness, and I agree with the above. -  Marjory Sneddon, D.O.  The [place] was signed into law in 2016 which includes the topic of electronic health records.  This provides immediate access to information in MyChart.  This includes consultation notes, operative notes, office notes, lab results and pathology reports.  If you have any questions about what you read please let us know at your next visit so we can discuss your concerns and take corrective action if need be.  We are right here with you.

## 2021-08-05 ENCOUNTER — Other Ambulatory Visit: Payer: Self-pay | Admitting: Family Medicine

## 2021-08-09 ENCOUNTER — Other Ambulatory Visit: Payer: Self-pay | Admitting: Podiatry

## 2021-08-09 DIAGNOSIS — U071 COVID-19: Secondary | ICD-10-CM | POA: Diagnosis not present

## 2021-08-11 ENCOUNTER — Other Ambulatory Visit: Payer: Self-pay

## 2021-08-11 ENCOUNTER — Encounter (INDEPENDENT_AMBULATORY_CARE_PROVIDER_SITE_OTHER): Payer: Self-pay | Admitting: Family Medicine

## 2021-08-11 ENCOUNTER — Ambulatory Visit (INDEPENDENT_AMBULATORY_CARE_PROVIDER_SITE_OTHER): Payer: Medicare Other | Admitting: Family Medicine

## 2021-08-11 VITALS — BP 144/66 | HR 62 | Temp 98.0°F | Ht 69.0 in | Wt 247.0 lb

## 2021-08-11 DIAGNOSIS — Z6836 Body mass index (BMI) 36.0-36.9, adult: Secondary | ICD-10-CM

## 2021-08-11 DIAGNOSIS — E669 Obesity, unspecified: Secondary | ICD-10-CM | POA: Diagnosis not present

## 2021-08-11 DIAGNOSIS — K760 Fatty (change of) liver, not elsewhere classified: Secondary | ICD-10-CM

## 2021-08-11 DIAGNOSIS — E7849 Other hyperlipidemia: Secondary | ICD-10-CM | POA: Diagnosis not present

## 2021-08-11 DIAGNOSIS — R7303 Prediabetes: Secondary | ICD-10-CM

## 2021-08-11 DIAGNOSIS — I10 Essential (primary) hypertension: Secondary | ICD-10-CM

## 2021-08-11 DIAGNOSIS — E860 Dehydration: Secondary | ICD-10-CM

## 2021-08-11 MED ORDER — ATORVASTATIN CALCIUM 20 MG PO TABS: 20.0000 mg | ORAL_TABLET | Freq: Every day | ORAL | 3 refills | Status: DC

## 2021-08-15 NOTE — Progress Notes (Signed)
Chief Complaint:   OBESITY Samuel [name] is here to discuss his progress with his obesity treatment plan along with follow-up of his obesity related diagnoses. Samuel [name] is on the Category 3 Plan + 200 calories of 10:1 ratio and states he is following his eating plan approximately 75-80% of the time. Samuel [name] states he is not currently exercising.  Today's visit was #: 8 Starting weight: 269 lbs Starting date: 03/01/2021 Today's weight: 247 lbs Today's date: 08/11/2021 Total lbs lost to date: 22 Total lbs lost since last in-office visit: 4  Interim History: Pt recently got over a head cold and was under the weather last week. He has no issues or concerns with the plan. Pt has done better with meal prepping but is still struggling.   Subjective:   1. Other hyperlipidemia Discussed labs with patient today. LDL was 130 but is now 61! Pt started taking his Lipitor regularly a couple of months ago.  2. Essential hypertension Discussed labs with patient today. Mildly elevated BP today but ok for age and PMHx.  Medication: HCTZ, Zestril, Lopressor   3. Prediabetes Discussed labs with patient today. Rishard has a diagnosis of prediabetes based on his elevated HgA1c and was informed this puts him at greater risk of developing diabetes. He continues to work on diet and exercise to decrease his risk of diabetes. He denies nausea or hypoglycemia.  4. NAFLD (nonalcoholic fatty liver disease) Discussed labs with patient today. [place] has a dx of elevated ALT. His BMI is over 40. He denies abdominal pain or jaundice and has never been told of any liver problems in the past. He denies excessive alcohol intake.  5. Dehydration, mild Discussed labs with patient today. Pt is at 16-32 oz of fluids per day now.    Assessment/Plan:   Meds ordered this encounter  Medications   atorvastatin (LIPITOR) 20 MG tablet    Sig: Take 1 tablet (20 mg total) by mouth daily.    Dispense:  90 tablet    Refill:  3      1. Other hyperlipidemia Much improved FLP since pt is now taking medicine. Continue prudent nutritional plan and lifestyle changes and current dose of Lipitor.  Refill- atorvastatin (LIPITOR) 20 MG tablet; Take 1 tablet (20 mg total) by mouth daily.  Dispense: 90 tablet; Refill: 3  2. Essential hypertension Mildly elevated BP. Goal is <875 systolic.  - Continue medications - Lifestyle changes such as following our low salt, heart healthy meal plan and engaging in a regular exercise program discussed  - Avoid buying foods that are: processed, frozen, or prepackaged to avoid excess salt. - Ambulatory blood pressure monitoring encouraged.  Reminded patient that if they ever feel poorly in any way, to check their blood pressure and pulse as well. - We will continue to monitor closely alongside PCP/ specialists.  Pt reminded to also f/up with those individuals as instructed by them.  - We will continue to monitor symptoms as they relate to the his weight loss journey.    3. Prediabetes A1c improved from 5.8 to 5.6 recently. Pt's fasting insulin is still elevated at 24. - I reiterated and again counseled patient on pathophysiology of the disease process of I.R and Pre-DM.   - Stressed importance of dietary and lifestyle modifications to result in weight loss as first line txmnt - in addition we discussed the risks and benefits of various medication options which can help [place] in the management of this disease process as well  as with weight loss.  Will consider starting one of these meds in future, or a dose adjustment in the future, as we will focus on prudent nutritional plan at this time.  - continue to decrease simple carbs; increase fiber and proteins -> follow meal plan  - handouts provided at pt's request after education provided.  All concerns/questions addressed.   - anticipatory guidance given.   - We will recheck A1c and fasting insulin level in approximately 3 months from last  check, or as deemed appropriate.   4. NAFLD (nonalcoholic fatty liver disease) Improved from prior.  ALT and AST now within normal limits.  We discussed the likely diagnosis of non-alcoholic fatty liver disease today and how this condition is obesity related. Hosea was educated the importance of weight loss. [name] agreed to continue with his weight loss efforts with healthier diet and exercise as an essential part of his treatment plan.  5. Dehydration, mild Elevated BUN and BUN/Crt ratio. Shoot for 60-80 oz water per day to start.  We discussed the signs and symptoms of dehydration, some of which may include muscle cramping, constipation or even orthostatic symptoms.   Counseling on the prevention of dehydration was also provided today.    He was encouraged to adequately hydrate and monitor fluid status to avoid dehydration as well as weight loss plateaus.  Unless pre-existing renal or cardiopulmonary conditions exist, in which patient was told to limit their fluid intake, I recommended roughly one half of their weight in pounds to be the approximate ounces of non-caloric, non-caffeinated beverages they should drink per day; including more if they are engaging in exercise.   6. Obesity with current BMI of 36.5 Ronda is currently in the action stage of change. As such, his goal is to continue with weight loss efforts. He has agreed to the Category 3 Plan + 200 calories of 10:1 ratio.   Recipes given and ideas discussed with pt since he lives alone.  Exercise goals: All adults should avoid inactivity. Some physical activity is better than none, and adults who participate in any amount of physical activity gain some health benefits.  Behavioral modification strategies: increasing lean protein intake, decreasing eating out, meal planning and cooking strategies, and planning for success.  Carolyn has agreed to follow-up with our clinic in 3 weeks. He was informed of the importance of frequent  follow-up visits to maximize his success with intensive lifestyle modifications for his multiple health conditions.   Objective:   Blood pressure (!) 144/66, pulse 62, temperature 98 F (36.7 C), height 5\' 9"  (1.753 m), weight 247 lb (112 kg), SpO2 97 %. Body mass index is 36.48 kg/m.  General: Cooperative, alert, well developed, in no acute distress. HEENT: Conjunctivae and lids unremarkable. Cardiovascular: Regular rhythm.  Lungs: Normal work of breathing. Neurologic: No focal deficits.   Lab Results  Component Value Date   CREATININE 1.25 07/21/2021   BUN 36 (H) 07/21/2021   NA 141 07/21/2021   K 4.8 07/21/2021   CL 105 07/21/2021   CO2 22 07/21/2021   Lab Results  Component Value Date   ALT 28 07/21/2021   AST 30 07/21/2021   ALKPHOS 65 07/21/2021   BILITOT 0.9 07/21/2021   Lab Results  Component Value Date   HGBA1C 5.6 07/21/2021   HGBA1C 5.8 (H) 03/01/2021   HGBA1C 5.8 10/29/2020   HGBA1C 5.9 11/04/2019   HGBA1C 6.0 08/14/2018   Lab Results  Component Value Date   INSULIN 23.9 07/21/2021  INSULIN 35.6 (H) 03/01/2021   Lab Results  Component Value Date   TSH 2.130 03/01/2021   Lab Results  Component Value Date   CHOL 127 07/21/2021   HDL 50 07/21/2021   LDLCALC 61 07/21/2021   TRIG 82 07/21/2021   CHOLHDL 2.5 07/21/2021   No results found for: VD25OH Lab Results  Component Value Date   WBC 9.5 03/01/2021   HGB 15.8 03/01/2021   HCT 45.6 03/01/2021   MCV 94 03/01/2021   PLT 300 03/01/2021    Attestation Statements:   Reviewed by clinician on day of visit: allergies, medications, problem list, medical history, surgical history, family history, social history, and previous encounter notes.  Time spent on visit including pre-visit chart review and post-visit care and charting was 40 minutes.   Coral Ceo, CMA, am acting as transcriptionist for [employer], DO.  I have reviewed the above documentation for accuracy and  completeness, and I agree with the above. Marjory Sneddon, D.O.  The [place] was signed into law in 2016 which includes the topic of electronic health records.  This provides immediate access to information in MyChart.  This includes consultation notes, operative notes, office notes, lab results and pathology reports.  If you have any questions about what you read please let us know at your next visit so we can discuss your concerns and take corrective action if need be.  We are right here with you.

## 2021-09-06 ENCOUNTER — Encounter: Payer: Self-pay | Admitting: Family Medicine

## 2021-09-07 ENCOUNTER — Other Ambulatory Visit: Payer: Self-pay

## 2021-09-07 DIAGNOSIS — R32 Unspecified urinary incontinence: Secondary | ICD-10-CM

## 2021-09-15 ENCOUNTER — Encounter (INDEPENDENT_AMBULATORY_CARE_PROVIDER_SITE_OTHER): Payer: Self-pay | Admitting: Family Medicine

## 2021-09-15 ENCOUNTER — Ambulatory Visit (INDEPENDENT_AMBULATORY_CARE_PROVIDER_SITE_OTHER): Payer: Medicare Other | Admitting: Family Medicine

## 2021-09-15 ENCOUNTER — Other Ambulatory Visit: Payer: Self-pay

## 2021-09-15 VITALS — BP 122/79 | HR 50 | Temp 97.5°F | Ht 69.0 in | Wt 247.0 lb

## 2021-09-15 DIAGNOSIS — I10 Essential (primary) hypertension: Secondary | ICD-10-CM | POA: Diagnosis not present

## 2021-09-15 DIAGNOSIS — E669 Obesity, unspecified: Secondary | ICD-10-CM | POA: Diagnosis not present

## 2021-09-15 DIAGNOSIS — M167 Other unilateral secondary osteoarthritis of hip: Secondary | ICD-10-CM

## 2021-09-15 DIAGNOSIS — R7303 Prediabetes: Secondary | ICD-10-CM | POA: Diagnosis not present

## 2021-09-15 DIAGNOSIS — Z6836 Body mass index (BMI) 36.0-36.9, adult: Secondary | ICD-10-CM

## 2021-09-19 NOTE — Progress Notes (Signed)
? ? ? ?Chief Complaint:  ? ?OBESITY ?Samuel [name] is here to discuss his progress with his obesity treatment plan along with follow-up of his obesity related diagnoses. Samuel [name] is on the Category 3 Plan +200 calories of 10:1 ratio and states he is following his eating plan approximately 75-80% of the time. Samuel [name] states he is not exercising regularly. ? ?Today's visit was #: 9 ?Starting weight: 269 lbs ?Starting date: 03/01/2021 ?Today's weight: 247 lbs ?Today's date: 09/15/2021 ?Total lbs lost to date: 22 lbs ?Total lbs lost since last in-office visit: 0 ? ?Interim History: Samuel [name] feels that he followed the plan a little more diligently than usual.  Hunger and cravings controlled.  For snacks: crackers and cheese, but not measuring.  He is still struggling to make meals despite last OV's conversation/ suggestions.   He has not looked into [employer] meal prep yet. ? ?Subjective:  ? ?1. Prediabetes ?Samuel [name] denies cravings and concerns.  He is not on any medications.  Samuel [name] has a diagnosis of prediabetes based on his elevated HgA1c and was informed this puts him at greater risk of developing diabetes.  ? ?2. Essential hypertension ?Samuel [name] is taking HCTZ, Lopressor, and lisinopril.  He is tolerating medication(s) well without side effects.  Medication compliance is good and patient appears to be taking it as prescribed.  Denies additional concerns regarding this condition.  ? ?3. Other secondary osteoarthritis of hip, unspecified laterality ?He says that pain is limiting his activity.  He has been seen by [facility] in the past for his hip pain.  He has not had PT or an injection yet ? ? ?Assessment/Plan:  ? ?1. Prediabetes ?Samuel [name] will continue to work on weight loss, exercise, and decreasing simple carbohydrates to help decrease the risk of diabetes. I reiterated and again counseled patient on pathophysiology of the disease process of I.R. and Pre-DM.  ?Stressed importance of dietary and lifestyle modifications to result in  weight loss as first line txmnt ?Continue to decrease simple carbs; increase fiber and proteins -> follow meal plan  ?Handouts provided at pt's request after education provided.  All concerns/questions addressed.   ?Anticipatory guidance given.   ?- We will recheck A1c and fasting insulin level in approximately 3 months from last check, or as deemed appropriate.  ? ?2. Essential hypertension ?At goal and even improved from prior office visits.   ?- Continue medications- HCTZ, Lopressor, and lisinopril.  ?Lifestyle changes such as following our low salt, heart healthy meal plan and engaging in a regular exercise program discussed  ?- Avoid buying foods that are: processed, frozen, or prepackaged to avoid excess salt. ?- Ambulatory blood pressure monitoring encouraged.  Reminded patient that if they ever feel poorly in any way, to check their blood pressure and pulse as well. ?- We will continue to monitor closely alongside PCP/ specialists.  Pt reminded to also f/up with those individuals as instructed by them.  ?- We will continue to monitor symptoms as they relate to the his weight loss journey. ? ?3. Other secondary osteoarthritis of hip, unspecified laterality ?I discussed with the patient that an injection may be appropriate and encouraged him to follow-up with his orthopedist to discuss further plan of care options.  He will increase activity as tolerated.  Ice 15 min prn ? ?4. Obesity with current BMI of 36.6 ? ?Allex is currently in the action stage of change. As such, his goal is to continue with weight loss efforts. He has agreed to the Category 3 Plan  BUT WITH ONLY 200 snack calories 10:1 ratio ? ?We discussed Samuel [name]'s [employer].  Gave recipe packet I and discussed cooking techniques.  ? ?Exercise goals:  As is. ? ?Behavioral modification strategies: meal planning and cooking strategies and keeping healthy foods in the home. ? ?Samuel [name] has agreed to follow-up with our clinic in 3-4 weeks. He was  informed of the importance of frequent follow-up visits to maximize his success with intensive lifestyle modifications for his multiple health conditions.  ? ?Objective:  ? ?Blood pressure 122/79, pulse (!) 50, temperature (!) 97.5 ?F (36.4 ?C), temperature source Oral, height '5\' 9"'$  (1.753 m), weight 247 lb (112 kg), SpO2 97 %. ?Body mass index is 36.48 kg/m?. ? ?General: Cooperative, alert, well developed, in no acute distress. ?HEENT: Conjunctivae and lids unremarkable. ?Cardiovascular: Regular rhythm.  ?Lungs: Normal work of breathing. ?Neurologic: No focal deficits.  ? ?Lab Results  ?Component Value Date  ? CREATININE 1.25 07/21/2021  ? BUN 36 (H) 07/21/2021  ? NA 141 07/21/2021  ? K 4.8 07/21/2021  ? CL 105 07/21/2021  ? CO2 22 07/21/2021  ? ?Lab Results  ?Component Value Date  ? ALT 28 07/21/2021  ? AST 30 07/21/2021  ? ALKPHOS 65 07/21/2021  ? BILITOT 0.9 07/21/2021  ? ?Lab Results  ?Component Value Date  ? HGBA1C 5.6 07/21/2021  ? HGBA1C 5.8 (H) 03/01/2021  ? HGBA1C 5.8 10/29/2020  ? HGBA1C 5.9 11/04/2019  ? HGBA1C 6.0 08/14/2018  ? ?Lab Results  ?Component Value Date  ? INSULIN 23.9 07/21/2021  ? INSULIN 35.6 (H) 03/01/2021  ? ?Lab Results  ?Component Value Date  ? TSH 2.130 03/01/2021  ? ?Lab Results  ?Component Value Date  ? CHOL 127 07/21/2021  ? HDL 50 07/21/2021  ? [place] 61 07/21/2021  ? TRIG 82 07/21/2021  ? CHOLHDL 2.5 07/21/2021  ? ?Lab Results  ?Component Value Date  ? WBC 9.5 03/01/2021  ? HGB 15.8 03/01/2021  ? HCT 45.6 03/01/2021  ? MCV 94 03/01/2021  ? PLT 300 03/01/2021  ? ?Obesity Behavioral Intervention:  ? ?Approximately 15 minutes were spent on the discussion below. ? ?ASK: ?We discussed the diagnosis of obesity with Samuel [name] today and Samuel [name] agreed to give [place] permission to discuss obesity behavioral modification therapy today. ? ?ASSESS: ?Samuel [name] has the diagnosis of obesity and his BMI today is 36.6. Samuel [name] is in the action stage of change.  ? ?ADVISE: ?Samuel [name] was educated on the multiple  health risks of obesity as well as the benefit of weight loss to improve his health. He was advised of the need for long term treatment and the importance of lifestyle modifications to improve his current health and to decrease his risk of future health problems. ? ?AGREE: ?Multiple dietary modification options and treatment options were discussed and Samuel [name] agreed to follow the recommendations documented in the above note. ? ?ARRANGE: ?Samuel [name] was educated on the importance of frequent visits to treat obesity as outlined per CMS and USPSTF guidelines and agreed to schedule his next follow up appointment today. ? ?Attestation Statements:  ? ?Reviewed by clinician on day of visit: allergies, medications, problem list, medical history, surgical history, family history, social history, and previous encounter notes. ? ?I, Water quality scientist, CMA, am acting as transcriptionist for [employer], DO. ? ?I have reviewed the above documentation for accuracy and completeness, and I agree with the above. Marjory Sneddon, D.O. ? ?The [place] was signed into law in 2016 which includes  the topic of electronic health records.  This provides immediate access to information in MyChart.  This includes consultation notes, operative notes, office notes, lab results and pathology reports.  If you have any questions about what you read please let us know at your next visit so we can discuss your concerns and take corrective action if need be.  We are right here with you. ? ?

## 2021-09-23 ENCOUNTER — Other Ambulatory Visit: Payer: Self-pay | Admitting: Family Medicine

## 2021-09-26 MED ORDER — INDOMETHACIN 25 MG PO CAPS: 25.0000 mg | ORAL_CAPSULE | Freq: Every day | ORAL | 2 refills | Status: DC

## 2021-10-13 ENCOUNTER — Encounter (INDEPENDENT_AMBULATORY_CARE_PROVIDER_SITE_OTHER): Payer: Self-pay | Admitting: Family Medicine

## 2021-10-13 ENCOUNTER — Ambulatory Visit (INDEPENDENT_AMBULATORY_CARE_PROVIDER_SITE_OTHER): Payer: Medicare Other | Admitting: Family Medicine

## 2021-10-13 VITALS — BP 112/66 | HR 55 | Temp 98.0°F | Ht 69.0 in | Wt 245.0 lb

## 2021-10-13 DIAGNOSIS — E669 Obesity, unspecified: Secondary | ICD-10-CM

## 2021-10-13 DIAGNOSIS — E7849 Other hyperlipidemia: Secondary | ICD-10-CM | POA: Diagnosis not present

## 2021-10-13 DIAGNOSIS — Z6836 Body mass index (BMI) 36.0-36.9, adult: Secondary | ICD-10-CM | POA: Diagnosis not present

## 2021-10-13 DIAGNOSIS — M109 Gout, unspecified: Secondary | ICD-10-CM | POA: Diagnosis not present

## 2021-10-13 DIAGNOSIS — I10 Essential (primary) hypertension: Secondary | ICD-10-CM

## 2021-10-17 NOTE — Progress Notes (Signed)
? ? ? ?Chief Complaint:  ? ?OBESITY ?Samuel [name] [name] here to discuss his progress with his obesity treatment plan along with follow-up of his obesity related diagnoses. Samuel [name] [name] on the Category 3 Plan with 200 snack calories 10:1 ratio and states he [name] following his eating plan approximately 75-80% of the time. Samuel [name] states he [name] not currently exercising. ? ?Today's visit was #: 10 ?Starting weight: 269 lbs  ?Starting date: 03/01/2021 ?Today's weight: 245 lbs ?Today's date: 10/13/2021 ?Total lbs lost to date: 24 ?Total lbs lost since last in-office visit: 2 ? ?Interim History: Samuel [name] went to the beach even over the weekend. He has been more creative in his cooking and tried Samuel [name]'s all natural oven while rice and loved it.  ? ?Subjective:  ? ?1. Essential hypertension ?Samuel [name] [name] taking hydrochlorothiazide, Zestril, and Lopressor.  ? ?BP Readings from Last 3 Encounters:  ?10/13/21 112/66  ?09/15/21 122/79  ?08/11/21 (!) 144/66  ? ?2. Gout, unspecified cause, unspecified chronicity, unspecified site ?Samuel [name] [name] taking Colchicine and indomethacin. He reports while on the meal plan, he has no flares of gout.  ? ?3. Other hyperlipidemia ?Samuel [name] has hyperlipidemia and he [name] taking Lipitor. He has been trying to improve his cholesterol levels with intensive lifestyle modifications including a low saturated fat diet, exercise, and weight loss. He denies any chest pain, claudication, or myalgias. ? ?Assessment/Plan:  ?No orders of the defined types were placed in this encounter. ? ? ?There are no discontinued medications.  ? ?No orders of the defined types were placed in this encounter. ?  ? ?1. Essential hypertension ?BP [name] at goal today.  ?Counseled Samuel [name] on pathophysiology of disease and discussed treatment plan, which always includes dietary and lifestyle modification Samuel first line.  ?Lifestyle changes such Samuel following our low salt, heart healthy meal plan and engaging in a regular exercise program discussed  ?- Avoid  buying foods that are: processed, frozen, or prepackaged to avoid excess salt. ?- Ambulatory blood pressure monitoring encouraged.  Reminded patient that if they ever feel poorly in any way, to check their blood pressure and pulse Samuel well. ?- We will continue to monitor closely alongside Samuel [name]/ specialists.  Pt reminded to also f/up with those individuals Samuel instructed by them.  ?- We will continue to monitor symptoms Samuel they relate to the his weight loss journey. ? ?2. Gout, unspecified cause, unspecified chronicity, unspecified site ?Samuel [name] [name] stable, and he [name] asymptomatic. He will continue adequate hydration, and his prudent nutritional plan that [name] low in purines.  ? ?3. Other hyperlipidemia ?Cardiovascular risk and specific lipid/LDL goals reviewed.  We discussed several lifestyle modifications today and Samuel [name] will continue to work on diet, exercise and weight loss efforts. Orders and follow up Samuel documented in patient record.  ? ?Counseling ?Intensive lifestyle modifications are the first line treatment for this issue. ?Dietary changes: Increase soluble fiber. Decrease simple carbohydrates. ?Exercise changes: Moderate to vigorous-intensity aerobic activity 150 minutes per week if tolerated. ?Lipid-lowering medications: see documented in medical record. ? ?4. Obesity with current BMI of 36.2 ?Samuel [name] [name] currently in the action stage of change. Samuel such, his goal [name] to continue with weight loss efforts. He has [name] to the Category 3 Plan.  ? ?Try to substitute 1/2 cup of brown rice for 2 pieces of bread. Encourage cauliflower rice.  ? ?Exercise goals: Samuel [name]. ? ?Behavioral modification strategies: decreasing simple carbohydrates. ? ?Samuel [name] has [name] to follow-up with our clinic in 4 to 5 weeks.  He was informed of the importance of frequent follow-up visits to maximize his success with intensive lifestyle modifications for his multiple health conditions.  ? ?Objective:  ? ?Blood pressure 112/66, pulse (!) 55,  temperature 98 ?F (36.7 ?C), height '5\' 9"'$  (1.753 m), weight 245 lb (111.1 kg), SpO2 97 %. ?Body mass index [name] 36.18 kg/m?. ? ?General: Cooperative, alert, well developed, in no acute distress. ?HEENT: Conjunctivae and lids unremarkable. ?Cardiovascular: Regular rhythm.  ?Lungs: Normal work of breathing. ?Neurologic: No focal deficits.  ? ?Lab Results  ?Component Value Date  ? CREATININE 1.25 07/21/2021  ? BUN 36 (H) 07/21/2021  ? NA 141 07/21/2021  ? K 4.8 07/21/2021  ? CL 105 07/21/2021  ? CO2 22 07/21/2021  ? ?Lab Results  ?Component Value Date  ? ALT 28 07/21/2021  ? AST 30 07/21/2021  ? ALKPHOS 65 07/21/2021  ? BILITOT 0.9 07/21/2021  ? ?Lab Results  ?Component Value Date  ? HGBA1C 5.6 07/21/2021  ? HGBA1C 5.8 (H) 03/01/2021  ? HGBA1C 5.8 10/29/2020  ? HGBA1C 5.9 11/04/2019  ? HGBA1C 6.0 08/14/2018  ? ?Lab Results  ?Component Value Date  ? INSULIN 23.9 07/21/2021  ? INSULIN 35.6 (H) 03/01/2021  ? ?Lab Results  ?Component Value Date  ? TSH 2.130 03/01/2021  ? ?Lab Results  ?Component Value Date  ? CHOL 127 07/21/2021  ? HDL 50 07/21/2021  ? Samuel [name] 61 07/21/2021  ? TRIG 82 07/21/2021  ? CHOLHDL 2.5 07/21/2021  ? ?No results found for: VD25OH ?Lab Results  ?Component Value Date  ? WBC 9.5 03/01/2021  ? HGB 15.8 03/01/2021  ? HCT 45.6 03/01/2021  ? MCV 94 03/01/2021  ? PLT 300 03/01/2021  ? ?No results found for: IRON, TIBC, FERRITIN ? ?Obesity Behavioral Intervention:  ? ?Approximately 15 minutes were spent on the discussion below. ? ?ASK: ?We discussed the diagnosis of obesity with Samuel [name] today and Samuel [name] to give [place] permission to discuss obesity behavioral modification therapy today. ? ?ASSESS: ?Samuel [name] has the diagnosis of obesity and his BMI today [name] 36.2. Samuel [name] in the action stage of change.  ? ?ADVISE: ?Samuel [name] was educated on the multiple health risks of obesity Samuel well Samuel the benefit of weight loss to improve his health. He was advised of the need for long term treatment and the importance of  lifestyle modifications to improve his current health and to decrease his risk of future health problems. ? ?AGREE: ?Multiple dietary modification options and treatment options were discussed and Maddix [name] to follow the recommendations documented in the above note. ? ?ARRANGE: ?Delma was educated on the importance of frequent visits to treat obesity Samuel outlined per CMS and USPSTF guidelines and [name] to schedule his next follow up appointment today. ? ?Attestation Statements:  ? ?Reviewed by clinician on day of visit: allergies, medications, problem list, medical history, surgical history, family history, social history, and previous encounter notes. ? ? ?I, Trixie Dredge, am acting Samuel transcriptionist for [employer], DO. ? ?I have reviewed the above documentation for accuracy and completeness, and I agree with the above. Marjory Sneddon, D.O. ? ?The [place] was signed into law in 2016 which includes the topic of electronic health records.  This provides immediate access to information in MyChart.  This includes consultation notes, operative notes, office notes, lab results and pathology reports.  If you have any questions about what you read please let us know at your next visit so we can  discuss your concerns and take corrective action if need be.  We are right here with you. ? ? ?

## 2021-10-18 ENCOUNTER — Other Ambulatory Visit: Payer: Self-pay | Admitting: Dermatology

## 2021-10-18 ENCOUNTER — Other Ambulatory Visit (INDEPENDENT_AMBULATORY_CARE_PROVIDER_SITE_OTHER): Payer: Self-pay | Admitting: Ophthalmology

## 2021-10-24 ENCOUNTER — Telehealth: Payer: Self-pay | Admitting: Podiatry

## 2021-10-24 ENCOUNTER — Other Ambulatory Visit: Payer: Self-pay | Admitting: Podiatry

## 2021-10-24 NOTE — Telephone Encounter (Signed)
Patient said his gout medication was denied by the pharmacy. The Cultragene medication. He tooks the last two pills from his last prescription. ?

## 2021-10-25 ENCOUNTER — Other Ambulatory Visit: Payer: Self-pay | Admitting: Podiatry

## 2021-10-25 MED ORDER — COLCHICINE 0.6 MG PO TABS: ORAL_TABLET | ORAL | 0 refills | Status: DC

## 2021-11-03 ENCOUNTER — Encounter: Payer: Self-pay | Admitting: Podiatry

## 2021-11-03 ENCOUNTER — Ambulatory Visit (INDEPENDENT_AMBULATORY_CARE_PROVIDER_SITE_OTHER): Payer: Medicare Other | Admitting: Podiatry

## 2021-11-03 ENCOUNTER — Ambulatory Visit (INDEPENDENT_AMBULATORY_CARE_PROVIDER_SITE_OTHER): Payer: Medicare Other

## 2021-11-03 DIAGNOSIS — M1A0710 Idiopathic chronic gout, right ankle and foot, without tophus (tophi): Secondary | ICD-10-CM

## 2021-11-03 DIAGNOSIS — M2041 Other hammer toe(s) (acquired), right foot: Secondary | ICD-10-CM | POA: Diagnosis not present

## 2021-11-03 DIAGNOSIS — M2042 Other hammer toe(s) (acquired), left foot: Secondary | ICD-10-CM

## 2021-11-03 DIAGNOSIS — Q828 Other specified congenital malformations of skin: Secondary | ICD-10-CM | POA: Diagnosis not present

## 2021-11-03 MED ORDER — ALLOPURINOL 100 MG PO TABS: 100.0000 mg | ORAL_TABLET | Freq: Every day | ORAL | 6 refills | Status: DC

## 2021-11-03 NOTE — Progress Notes (Signed)
Subjective:  ? ?Patient ID: Samuel [name], male   DOB: 71 y.o.   MRN: [id number]  ? ?HPI ?Patient presents stating he has a painful lesion underneath his left foot and he has been having multiple gout attacks approximately every 2 to 4 weeks and has to take colchicine fairly consistently ? ? ?ROS ? ? ?   ?Objective:  ?Physical Exam  ?Neurovascular status is found to be intact significant structural bone issues with hammertoe deformity and keratotic lesion subsecond metatarsal left that is painful with significant history of gout that is only being treated currently with colchicine ? ?   ?Assessment:  ?Chronic foot structural issues bilateral with probable acute gout which continues to occur along with chronic keratotic lesion left ? ?   ?Plan:  ?H&P reviewed conditions and at this point sterile debridement of lesion with sharp instrumentation accomplished no iatrogenic bleeding.  I then discussed his gout and I do think that we need to get him on medicine I recommended allopurinol at the current time to prevent these attacks as I do not think it is good for his system.  He agrees and will start allopurinol be followed up by family physician ? ?X-rays indicate that there is significant digital deformity left over right with lesion directly on the second metatarsal left foot ?   ? ? ?

## 2021-11-15 DIAGNOSIS — M25551 Pain in right hip: Secondary | ICD-10-CM | POA: Diagnosis not present

## 2021-11-16 DIAGNOSIS — N401 Enlarged prostate with lower urinary tract symptoms: Secondary | ICD-10-CM | POA: Diagnosis not present

## 2021-11-16 DIAGNOSIS — R3915 Urgency of urination: Secondary | ICD-10-CM | POA: Diagnosis not present

## 2021-11-16 DIAGNOSIS — N5201 Erectile dysfunction due to arterial insufficiency: Secondary | ICD-10-CM | POA: Diagnosis not present

## 2021-11-17 ENCOUNTER — Ambulatory Visit (INDEPENDENT_AMBULATORY_CARE_PROVIDER_SITE_OTHER): Payer: Medicare Other | Admitting: Family Medicine

## 2021-11-21 ENCOUNTER — Ambulatory Visit (INDEPENDENT_AMBULATORY_CARE_PROVIDER_SITE_OTHER): Payer: Medicare Other | Admitting: Family Medicine

## 2021-11-21 ENCOUNTER — Encounter (INDEPENDENT_AMBULATORY_CARE_PROVIDER_SITE_OTHER): Payer: Self-pay | Admitting: Family Medicine

## 2021-11-21 VITALS — BP 123/74 | HR 54 | Temp 97.7°F | Ht 69.0 in | Wt 244.0 lb

## 2021-11-21 DIAGNOSIS — E669 Obesity, unspecified: Secondary | ICD-10-CM | POA: Diagnosis not present

## 2021-11-21 DIAGNOSIS — M1990 Unspecified osteoarthritis, unspecified site: Secondary | ICD-10-CM

## 2021-11-21 DIAGNOSIS — R7303 Prediabetes: Secondary | ICD-10-CM | POA: Diagnosis not present

## 2021-11-21 DIAGNOSIS — I10 Essential (primary) hypertension: Secondary | ICD-10-CM

## 2021-11-21 DIAGNOSIS — Z6836 Body mass index (BMI) 36.0-36.9, adult: Secondary | ICD-10-CM | POA: Diagnosis not present

## 2021-11-21 DIAGNOSIS — R350 Frequency of micturition: Secondary | ICD-10-CM | POA: Insufficient documentation

## 2021-11-24 DIAGNOSIS — M160 Bilateral primary osteoarthritis of hip: Secondary | ICD-10-CM | POA: Diagnosis not present

## 2021-11-26 ENCOUNTER — Other Ambulatory Visit: Payer: Self-pay | Admitting: Family Medicine

## 2021-11-28 ENCOUNTER — Telehealth: Payer: Self-pay | Admitting: Family Medicine

## 2021-11-28 NOTE — Telephone Encounter (Signed)
Pt called to check on the status of why this Rx Metoprolol Tartrate 50 MG was denied. I let pt know that it looks like the request already was responded to by other means either fax or phone but I will send a msg back to confirm. Pt understood.   Please advise.

## 2021-11-28 NOTE — Telephone Encounter (Signed)
Pt's refill was denied due to pt not being seen in over 1 year. Futre OV scheduled and rx sent for 30 days

## 2021-11-30 NOTE — Telephone Encounter (Signed)
Disregard

## 2021-12-02 ENCOUNTER — Other Ambulatory Visit: Payer: Self-pay | Admitting: Family Medicine

## 2021-12-02 DIAGNOSIS — E7849 Other hyperlipidemia: Secondary | ICD-10-CM

## 2021-12-02 NOTE — Progress Notes (Unsigned)
Chief Complaint:   OBESITY Samuel [name] is here to discuss his progress with his obesity treatment plan along with follow-up of his obesity related diagnoses. Samuel [name] is on the Category 3 Plan and states he is following his eating plan approximately 75-80% of the time. Samuel [name] states he is not currently exercising due to hip pain.  Today's visit was #: 11 Starting weight: 269 lbs Starting date: 03/01/2021 Today's weight: 244 lbs Today's date: 11/21/2021 Total lbs lost to date: 25 Total lbs lost since last in-office visit: 1  Interim History: Pt skips his lunch and has yogurt or cottage cheese. He still struggles with meal planning/prepping and is not weighing his evening proteins.  Subjective:   1. Pre-diabetes Pt is not on medication. His serum creatinine is typically 1.2-1.3. He denies sweets cravings or hunger.   2. Essential hypertension BP at goal. Medication: HCTZ, Zestril, lopressor  3. Chronic osteoarthritis, Hip Pt sees Dr. Cay Schillings at [facility] and was referred to Dr. Alvan Dame for a surgical consult, whom he will see in the near future.  4. Urinary frequency Pt has h/o benign prostatic hyperplasia. He rarely drink during the day due to frequent urination. He is not sure how many ounces of water he drinks daily.   Assessment/Plan:  No orders of the defined types were placed in this encounter.   There are no discontinued medications.   No orders of the defined types were placed in this encounter.    1. Pre-diabetes Samuel [name] will continue to work on weight loss, exercise, and decreasing simple carbohydrates to help decrease the risk of diabetes. Repeat fasting insulin and A1c at next OV. Continue prudent nutritional plan and weight loss.  2. Essential hypertension Samuel [name] is working on healthy weight loss and exercise to improve blood pressure control. We will watch for signs of hypotension as he continues his lifestyle modifications. Continue meds per PCP. Continue  prudent nutritional plan and weight loss.  3. Chronic osteoarthritis, Hip Increase activity as tolerated. Walk 15 minutes 3 days a week, in addition to grocery shopping, etc.  4. Urinary frequency Pt is managed by urology and notes it is difficult to drink during the day due to frequent urination. Continue Rapaflo and treatment per specialist.  5. Obesity, Current BMI 36.1 Samuel [name] is currently in the action stage of change. As such, his goal is to continue with weight loss efforts. He has agreed to the Category 3 Plan.   Be more mindful os protein amounts- weigh them and try to increase activity, as pt walks around grocery store without issue.   Exercise goals:  Limited in activity due to hip, but pt will try 15-20 minutes of physical activity 3 days a week.  Behavioral modification strategies: increasing water intake and planning for success.  Samuel [name] has agreed to follow-up with our clinic in 3 weeks (come in fasting 30 minutes prior to appt time for repeat IC and A1c or come in prior to scheduled appt). He was informed of the importance of frequent follow-up visits to maximize his success with intensive lifestyle modifications for his multiple health conditions.   Objective:   Blood pressure 123/74, pulse (!) 54, temperature 97.7 F (36.5 C), height '5\' 9"'$  (1.753 m), weight 244 lb (110.7 kg), SpO2 96 %. Body mass index is 36.03 kg/m.  General: Cooperative, alert, well developed, in no acute distress. HEENT: Conjunctivae and lids unremarkable. Cardiovascular: Regular rhythm.  Lungs: Normal work of breathing. Neurologic: No focal deficits.   Lab Results  Component Value Date   CREATININE 1.25 07/21/2021   BUN 36 (H) 07/21/2021   NA 141 07/21/2021   K 4.8 07/21/2021   CL 105 07/21/2021   CO2 22 07/21/2021   Lab Results  Component Value Date   ALT 28 07/21/2021   AST 30 07/21/2021   ALKPHOS 65 07/21/2021   BILITOT 0.9 07/21/2021   Lab Results  Component Value Date    HGBA1C 5.6 07/21/2021   HGBA1C 5.8 (H) 03/01/2021   HGBA1C 5.8 10/29/2020   HGBA1C 5.9 11/04/2019   HGBA1C 6.0 08/14/2018   Lab Results  Component Value Date   INSULIN 23.9 07/21/2021   INSULIN 35.6 (H) 03/01/2021   Lab Results  Component Value Date   TSH 2.130 03/01/2021   Lab Results  Component Value Date   CHOL 127 07/21/2021   HDL 50 07/21/2021   LDLCALC 61 07/21/2021   TRIG 82 07/21/2021   CHOLHDL 2.5 07/21/2021   No results found for: VD25OH Lab Results  Component Value Date   WBC 9.5 03/01/2021   HGB 15.8 03/01/2021   HCT 45.6 03/01/2021   MCV 94 03/01/2021   PLT 300 03/01/2021   No results found for: IRON, TIBC, FERRITIN  Obesity Behavioral Intervention:   Approximately 15 minutes were spent on the discussion below.  ASK: We discussed the diagnosis of obesity with Samuel [name] today and Samuel [name] agreed to give [place] permission to discuss obesity behavioral modification therapy today.  ASSESS: Samuel [name] has the diagnosis of obesity and his BMI today is 36.1. Samuel [name] is in the action stage of change.   ADVISE: Samuel [name] was educated on the multiple health risks of obesity as well as the benefit of weight loss to improve his health. He was advised of the need for long term treatment and the importance of lifestyle modifications to improve his current health and to decrease his risk of future health problems.  AGREE: Multiple dietary modification options and treatment options were discussed and Samuel [name] agreed to follow the recommendations documented in the above note.  ARRANGE: Samuel [name] was educated on the importance of frequent visits to treat obesity as outlined per CMS and USPSTF guidelines and agreed to schedule his next follow up appointment today.  Attestation Statements:   Reviewed by clinician on day of visit: allergies, medications, problem list, medical history, surgical history, family history, social history, and previous encounter notes.  I, Kathlene November, BS,  CMA, am acting as transcriptionist for [employer], DO.  I have reviewed the above documentation for accuracy and completeness, and I agree with the above. Marjory Sneddon, D.O.  The [place] was signed into law in 2016 which includes the topic of electronic health records.  This provides immediate access to information in MyChart.  This includes consultation notes, operative notes, office notes, lab results and pathology reports.  If you have any questions about what you read please let us know at your next visit so we can discuss your concerns and take corrective action if need be.  We are right here with you.

## 2021-12-03 ENCOUNTER — Other Ambulatory Visit: Payer: Self-pay | Admitting: Family Medicine

## 2021-12-03 DIAGNOSIS — E7849 Other hyperlipidemia: Secondary | ICD-10-CM

## 2021-12-07 ENCOUNTER — Other Ambulatory Visit: Payer: Self-pay | Admitting: Family Medicine

## 2021-12-07 DIAGNOSIS — E7849 Other hyperlipidemia: Secondary | ICD-10-CM

## 2021-12-08 ENCOUNTER — Other Ambulatory Visit: Payer: Self-pay | Admitting: Family Medicine

## 2021-12-08 DIAGNOSIS — E7849 Other hyperlipidemia: Secondary | ICD-10-CM

## 2021-12-10 ENCOUNTER — Other Ambulatory Visit: Payer: Self-pay | Admitting: Podiatry

## 2021-12-13 ENCOUNTER — Ambulatory Visit (INDEPENDENT_AMBULATORY_CARE_PROVIDER_SITE_OTHER): Payer: Medicare Other | Admitting: Family Medicine

## 2021-12-13 ENCOUNTER — Encounter (INDEPENDENT_AMBULATORY_CARE_PROVIDER_SITE_OTHER): Payer: Self-pay | Admitting: Family Medicine

## 2021-12-13 ENCOUNTER — Encounter: Payer: Self-pay | Admitting: Family Medicine

## 2021-12-13 VITALS — BP 110/60 | HR 50 | Temp 97.7°F | Ht 69.29 in | Wt 245.2 lb

## 2021-12-13 DIAGNOSIS — I10 Essential (primary) hypertension: Secondary | ICD-10-CM

## 2021-12-13 DIAGNOSIS — K760 Fatty (change of) liver, not elsewhere classified: Secondary | ICD-10-CM

## 2021-12-13 DIAGNOSIS — Z1211 Encounter for screening for malignant neoplasm of colon: Secondary | ICD-10-CM

## 2021-12-13 DIAGNOSIS — E785 Hyperlipidemia, unspecified: Secondary | ICD-10-CM

## 2021-12-13 DIAGNOSIS — R7303 Prediabetes: Secondary | ICD-10-CM | POA: Diagnosis not present

## 2021-12-13 DIAGNOSIS — Z125 Encounter for screening for malignant neoplasm of prostate: Secondary | ICD-10-CM | POA: Diagnosis not present

## 2021-12-13 DIAGNOSIS — M109 Gout, unspecified: Secondary | ICD-10-CM | POA: Diagnosis not present

## 2021-12-13 DIAGNOSIS — R195 Other fecal abnormalities: Secondary | ICD-10-CM | POA: Diagnosis not present

## 2021-12-13 LAB — URIC ACID: Uric Acid, Serum: 6.9 mg/dL (ref 4.0–7.8)

## 2021-12-13 LAB — PSA, MEDICARE: PSA: 2.02 ng/ml (ref 0.10–4.00)

## 2021-12-13 NOTE — Progress Notes (Signed)
Established Patient Office Visit  Subjective   Patient ID: Samuel [name], male    DOB: 1951/01/14  Age: 71 y.o. MRN: [id number]  Chief Complaint  Patient presents with   Annual Exam    HPI   Samuel [name] is seen for medical follow-up.  Progressive arthritis right hip.  He has been scheduled for right hip replacement in August.  He continues to work full-time.  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  He has been followed by the weight management center and states he is lost about 25 pounds due to dietary efforts.  Largely he has done this through reducing snack foods and increased high-protein food choices.  Hopes to get more active after his hip surgery.  He has recently seen podiatrist.  He complained of some recurrent acute foot pain in various spots including MTP joint but also Achilles area.  Was placed per podiatry on allopurinol 100 mg once daily.  Since starting this several months ago has still had several flareups.  Has used colchicine as needed in between.  Does take HCTZ for hypertension along with lisinopril and metoprolol.  Blood pressures been very well controlled.  He takes atorvastatin for hyperlipidemia.  He had recent lab work through weight loss clinic and A1c was stable at 5.6 and lipids well controlled.  Health maintenance reviewed.  He declines colonoscopy.  Due for repeat Cologuard.  Pneumonia vaccine already completed.  Has had Shingrix.  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   severe- right anterior shoulder- (EXC)   Atypical nevus 07/18/2011   mild-mod-right upper abdomen   Back pain    Basal cell carcinoma 07/17/2019   nod-left forearm-([id number])   COVID 01/07/2021   EPISTAXIS, RECURRENT 12/21/2009   ESSENTIAL HYPERTENSION 12/21/2009   Hypertension    Joint pain    Other fatigue    Prediabetes    Retinal detachment    OD    Shortness of breath on exertion    VENTRICULAR HYPERTROPHY, LEFT 01/04/2010   Past Surgical History:  Procedure Laterality Date   CATARACT EXTRACTION Bilateral 2009   EYE SURGERY Bilateral 2009   cataract   GAS/FLUID EXCHANGE Right 09/26/2018   Procedure: Gas/Fluid Exchange;  Surgeon: Bernarda Caffey, MD;  Location: [place];  Service: Ophthalmology;  Laterality: Right;   PHOTOCOAGULATION WITH LASER Right 09/26/2018   Procedure: Photocoagulation With Laser;  Surgeon: Bernarda Caffey, MD;  Location: [place];  Service: Ophthalmology;  Laterality: Right;   SCLERAL BUCKLE Right 09/26/2018   Procedure: Scleral Buckle;  Surgeon: Bernarda Caffey, MD;  Location: [place];  Service: Ophthalmology;  Laterality: Right;   VITRECTOMY 25 GAUGE WITH SCLERAL BUCKLE Right 09/26/2018   Procedure: VITRECTOMY 25 GAUGE;  Surgeon: Bernarda Caffey, MD;  Location: [place];  Service: Ophthalmology;  Laterality: Right;    reports that he quit smoking about 33 years ago. His smoking use included cigars and cigarettes. He has a 18.00 pack-year smoking history. He has never used smokeless tobacco. He reports current alcohol use. He reports that he does not use drugs. family history includes Cancer in his mother and paternal grandmother; Heart disease in his father, maternal grandfather, and paternal grandfather; Hypertension in his father, maternal grandfather, mother, and paternal grandfather. Allergies  Allergen Reactions   Penicillins     REACTION: hives    Review of Systems  Constitutional:  Negative for malaise/fatigue.  Respiratory:  Negative for shortness of breath.   Cardiovascular:  Negative for chest pain.  Gastrointestinal:  Negative for abdominal pain.  Neurological:  Negative for dizziness and headaches.  Endo/Heme/Allergies:  Negative for polydipsia.  Psychiatric/Behavioral:  Negative for depression.      Objective:     BP 110/60 (BP Location: Left Arm, Patient Position: Sitting, Cuff Size: Normal)   Pulse (!) 50    Temp 97.7 F (36.5 C) (Oral)   Ht 5' 9.29" (1.76 m)   Wt 245 lb 3.2 oz (111.2 kg)   SpO2 97%   BMI 35.91 kg/m  BP Readings from Last 3 Encounters:  12/13/21 110/60  11/21/21 123/74  10/13/21 112/66   Wt Readings from Last 3 Encounters:  12/13/21 245 lb 3.2 oz (111.2 kg)  11/21/21 244 lb (110.7 kg)  10/13/21 245 lb (111.1 kg)      Physical Exam Vitals reviewed.  Cardiovascular:     Rate and Rhythm: Normal rate and regular rhythm.  Pulmonary:     Effort: Pulmonary effort is normal.     Breath sounds: Normal breath sounds. No wheezing or rales.  Musculoskeletal:     Right lower leg: No edema.     Left lower leg: No edema.  Neurological:     General: No focal deficit present.     Mental Status: He is alert.     No results found for any visits on 12/13/21.  Last CBC Lab Results  Component Value Date   WBC 9.5 03/01/2021   HGB 15.8 03/01/2021   HCT 45.6 03/01/2021   MCV 94 03/01/2021   MCH 32.6 03/01/2021   RDW 12.8 03/01/2021   PLT 300 40/04/2724   Last metabolic panel Lab Results  Component Value Date   GLUCOSE 119 (H) 07/21/2021   NA 141 07/21/2021   K 4.8 07/21/2021   CL 105 07/21/2021   CO2 22 07/21/2021   BUN 36 (H) 07/21/2021   CREATININE 1.25 07/21/2021   EGFR 63 07/21/2021   CALCIUM 9.8 07/21/2021   PROT 6.9 07/21/2021   ALBUMIN 4.6 07/21/2021   LABGLOB 2.3 07/21/2021   AGRATIO 2.0 07/21/2021   BILITOT 0.9 07/21/2021   ALKPHOS 65 07/21/2021   AST 30 07/21/2021   ALT 28 07/21/2021   ANIONGAP 8 09/26/2018   Last lipids Lab Results  Component Value Date   CHOL 127 07/21/2021   HDL 50 07/21/2021   LDLCALC 61 07/21/2021   TRIG 82 07/21/2021   CHOLHDL 2.5 07/21/2021   Last hemoglobin A1c Lab Results  Component Value Date   HGBA1C 5.6 07/21/2021      The ASCVD Risk score (Arnett DK, et al., 2019) failed to calculate for the following reasons:   The valid total cholesterol range is 130 to 320 mg/dL    Assessment & Plan:    Problem List Items Addressed This Visit       Unprioritized   NAFLD (nonalcoholic fatty liver disease)   Hyperlipidemia   Essential hypertension - Primary   Prediabetes   Other Visit Diagnoses     Colon cancer screening       Relevant Orders   Cologuard   Gout, unspecified cause, unspecified chronicity, unspecified site       Relevant Orders   Uric Acid   Prostate cancer screening       Relevant Orders   PSA, Medicare     -Recommend check uric acid.  Goal less than 6.  If above 6 consider further titration of  allopurinol.  -Discussed low purine diet.  Handout given.  -We also discussed possible discontinuation of HCTZ which could help his gout situation especially if blood pressure remains stable with weight loss.  Discussed colon cancer screening.  He declines colonoscopy.  Set up repeat Cologuard.  -The natural history of prostate cancer and ongoing controversy regarding screening and potential treatment outcomes of prostate cancer has been discussed with the patient. The meaning of a false positive PSA and a false negative PSA has been discussed. He indicates understanding of the limitations of this screening test and wishes to proceed with screening PSA testing.  -Not checking lipids or A1c as these were checked recently through weight loss clinic   No follow-ups on file.    Carolann Littler, MD

## 2021-12-14 MED ORDER — ALLOPURINOL 100 MG PO TABS: ORAL_TABLET | ORAL | 0 refills | Status: DC

## 2021-12-14 NOTE — Addendum Note (Signed)
Addended by: Nilda Riggs on: 12/14/2021 09:53 AM   Modules accepted: Orders

## 2021-12-14 NOTE — Telephone Encounter (Signed)
Dr.Opalski ?

## 2021-12-15 ENCOUNTER — Ambulatory Visit (INDEPENDENT_AMBULATORY_CARE_PROVIDER_SITE_OTHER): Payer: Medicare Other | Admitting: Family Medicine

## 2021-12-15 ENCOUNTER — Encounter (INDEPENDENT_AMBULATORY_CARE_PROVIDER_SITE_OTHER): Payer: Self-pay | Admitting: Family Medicine

## 2021-12-15 VITALS — BP 124/66 | HR 42 | Temp 98.0°F | Ht 69.0 in | Wt 239.0 lb

## 2021-12-15 DIAGNOSIS — E669 Obesity, unspecified: Secondary | ICD-10-CM

## 2021-12-15 DIAGNOSIS — R0602 Shortness of breath: Secondary | ICD-10-CM

## 2021-12-15 DIAGNOSIS — E860 Dehydration: Secondary | ICD-10-CM | POA: Diagnosis not present

## 2021-12-15 DIAGNOSIS — E7849 Other hyperlipidemia: Secondary | ICD-10-CM | POA: Diagnosis not present

## 2021-12-15 DIAGNOSIS — R7303 Prediabetes: Secondary | ICD-10-CM | POA: Diagnosis not present

## 2021-12-15 DIAGNOSIS — K760 Fatty (change of) liver, not elsewhere classified: Secondary | ICD-10-CM

## 2021-12-15 DIAGNOSIS — Z6835 Body mass index (BMI) 35.0-35.9, adult: Secondary | ICD-10-CM

## 2021-12-16 LAB — COMPREHENSIVE METABOLIC PANEL
ALT: 31 IU/L (ref 0–44)
AST: 29 IU/L (ref 0–40)
Albumin/Globulin Ratio: 1.8 (ref 1.2–2.2)
Albumin: 4.5 g/dL (ref 3.8–4.8)
Alkaline Phosphatase: 65 IU/L (ref 44–121)
BUN/Creatinine Ratio: 23 (ref 10–24)
BUN: 32 mg/dL — ABNORMAL HIGH (ref 8–27)
Bilirubin Total: 1 mg/dL (ref 0.0–1.2)
CO2: 22 mmol/L (ref 20–29)
Calcium: 10 mg/dL (ref 8.6–10.2)
Chloride: 99 mmol/L (ref 96–106)
Creatinine, Ser: 1.42 mg/dL — ABNORMAL HIGH (ref 0.76–1.27)
Globulin, Total: 2.5 g/dL (ref 1.5–4.5)
Glucose: 132 mg/dL — ABNORMAL HIGH (ref 70–99)
Potassium: 5 mmol/L (ref 3.5–5.2)
Sodium: 137 mmol/L (ref 134–144)
Total Protein: 7 g/dL (ref 6.0–8.5)
eGFR: 54 mL/min/{1.73_m2} — ABNORMAL LOW (ref >59)

## 2021-12-16 LAB — LIPID PANEL
Chol/HDL Ratio: 2.5 ratio (ref 0.0–5.0)
Cholesterol, Total: 120 mg/dL (ref 100–199)
HDL: 48 mg/dL (ref >39)
LDL Chol Calc (NIH): 55 mg/dL (ref 0–99)
Triglycerides: 91 mg/dL (ref 0–149)
VLDL Cholesterol Cal: 17 mg/dL (ref 5–40)

## 2021-12-16 LAB — HEMOGLOBIN A1C
Est. average glucose Bld gHb Est-mCnc: 117 mg/dL
Hgb A1c MFr Bld: 5.7 % — ABNORMAL HIGH (ref 4.8–5.6)

## 2021-12-16 LAB — INSULIN, RANDOM: INSULIN: 15.7 u[IU]/mL (ref 2.6–24.9)

## 2021-12-20 DIAGNOSIS — R0602 Shortness of breath: Secondary | ICD-10-CM | POA: Insufficient documentation

## 2021-12-22 NOTE — Progress Notes (Unsigned)
Chief Complaint:   OBESITY Samuel [name] is here to discuss his progress with his obesity treatment plan along with follow-up of his obesity related diagnoses. Samuel [name] is on the Category 3 Plan and states he is following his eating plan approximately 75-80% of the time. Samuel [name] states he is not exercising.   Today's visit was #: 12 Starting weight: 269 lbs Starting date: 03/01/2021 Today's weight: 244 lbs Today's date: 12/15/2021 Total lbs lost to date: 25 lbs Total lbs lost since last in-office visit: 5 lbs  Interim History: Samuel [name] is here for a follow up office visit.  We reviewed his meal plan and all questions were answered.  Patient's food recall appears to be accurate and consistent with what is on plan when he is following it.   When eating on plan, his hunger and cravings are well controlled.     Subjective:   1. Prediabetes 07/21/21, A1c was 5.8, fasting insulin 23.9.  He is currently not on any medications.   2. SOB (shortness of breath) on exertion ***  3. Other hyperlipidemia Samuel [name] is tolerating medication(s) well without side effects.  Medication compliance is good as patient endorses taking it as prescribed.  The patient denies additional concerns regarding this condition.  He is on Lipitor.    4. NAFLD (nonalcoholic fatty liver disease) 9 months ago:  ALT 51, AST 47.  5. Dehydration, mild Samuel [name] has not improved much with his water intake.  He is getting 32-38 oz per day.  Has bourbon, water, wine or coffee as well.  He denies any constipation.    Assessment/Plan:   1. Prediabetes Recheck labs today. See below.  Continue prudent nutritional plan and weight loss.  Decrease simple carbohydrates and increase proteins.  - Hemoglobin A1c - Insulin, random  2. SOB (shortness of breath) on exertion Samuel [name] is here for a follow up office visit.  We reviewed his meal plan and all questions were answered.  Patient's food recall appears to be accurate  and consistent with what is on plan when he is following it.   When eating on plan, his hunger and cravings are well controlled.   Recheck IC today (2117)  3. Other hyperlipidemia Cardiovascular risk and specific lipid/LDL goals reviewed.  We discussed several lifestyle modifications today and [name] will continue to work on diet, exercise and weight loss efforts. Orders and follow up as documented in patient record.   Counseling Intensive lifestyle modifications are the first line treatment for this issue. Dietary changes: Increase soluble fiber. Decrease simple carbohydrates. Exercise changes: Moderate to vigorous-intensity aerobic activity 150 minutes per week if tolerated. Lipid-lowering medications: see documented in medical record.  Continue medications per PCP.   Recheck labs today. See below. Continue prudent nutritional plan and weight loss.   - Comprehensive metabolic panel - Lipid panel  4. NAFLD (nonalcoholic fatty liver disease) We discussed the likely diagnosis of non-alcoholic fatty liver disease today and how this condition is obesity related. Samuel [name] was educated the importance of weight loss. Samuel [name] agreed to continue with his weight loss efforts with healthier diet and exercise as an essential part of his treatment plan.   5. Dehydration, mild Increase water intake slowly. (Goal 50 oz for next office visit).  Discussed constipation prevention with Samuel [name].  Check labs today, see below.   - Comprehensive metabolic panel  6. Obesity, Current BMI 35.4 Samuel [name] is here fasting and forgot to come 30 minutes prior for IC, but obtained  it after the office visit today.  IC was 1944, IC today was 2117.  Thus has improved from prior due to increased protein.  Continue Category 3 meal plan.   Samuel [name] is currently in the action stage of change. As such, his goal is to continue with weight loss efforts. He has agreed to the Category 3 Plan with breakfast and lunch options.    Exercise  goals: All adults should avoid inactivity. Some physical activity is better than none, and adults who participate in any amount of physical activity gain some health benefits.  Has agreed to start walking.   Behavioral modification strategies: increasing lean protein intake, decreasing simple carbohydrates, increasing water intake, and decreasing liquid calories.  Samuel [name] has agreed to follow-up with our clinic in 3 weeks. He was informed of the importance of frequent follow-up visits to maximize his success with intensive lifestyle modifications for his multiple health conditions.   Samuel [name] was informed we would discuss his lab results at his next visit unless there is a critical issue that needs to be addressed sooner. Samuel [name] agreed to keep his next visit at the agreed upon time to discuss these results.  Objective:   Blood pressure 124/66, pulse (!) 42, temperature 98 F (36.7 C), height '5\' 9"'$  (1.753 m), weight 239 lb (108.4 kg), SpO2 97 %. Body mass index is 35.29 kg/m.  General: Cooperative, alert, well developed, in no acute distress. HEENT: Conjunctivae and lids unremarkable. Cardiovascular: Regular rhythm.  Lungs: Normal work of breathing. Neurologic: No focal deficits.   Lab Results  Component Value Date   CREATININE 1.42 (H) 12/15/2021   BUN 32 (H) 12/15/2021   NA 137 12/15/2021   K 5.0 12/15/2021   CL 99 12/15/2021   CO2 22 12/15/2021   Lab Results  Component Value Date   ALT 31 12/15/2021   AST 29 12/15/2021   ALKPHOS 65 12/15/2021   BILITOT 1.0 12/15/2021   Lab Results  Component Value Date   HGBA1C 5.7 (H) 12/15/2021   HGBA1C 5.6 07/21/2021   HGBA1C 5.8 (H) 03/01/2021   HGBA1C 5.8 10/29/2020   HGBA1C 5.9 11/04/2019   Lab Results  Component Value Date   INSULIN 15.7 12/15/2021   INSULIN 23.9 07/21/2021   INSULIN 35.6 (H) 03/01/2021   Lab Results  Component Value Date   TSH 2.130 03/01/2021   Lab Results  Component Value Date   CHOL 120 12/15/2021    HDL 48 12/15/2021   LDLCALC 55 12/15/2021   TRIG 91 12/15/2021   CHOLHDL 2.5 12/15/2021   No results found for: "VD25OH" Lab Results  Component Value Date   WBC 9.5 03/01/2021   HGB 15.8 03/01/2021   HCT 45.6 03/01/2021   MCV 94 03/01/2021   PLT 300 03/01/2021   No results found for: "IRON", "TIBC", "FERRITIN"  Obesity Behavioral Intervention:   Approximately 15 minutes were spent on the discussion below.  ASK: We discussed the diagnosis of obesity with Samuel [name] today and Samuel [name] agreed to give [place] permission to discuss obesity behavioral modification therapy today.  ASSESS: Samuel [name] has the diagnosis of obesity and his BMI today is 35.4. Samuel [name] is in the action stage of change.   ADVISE: Samuel [name] was educated on the multiple health risks of obesity as well as the benefit of weight loss to improve his health. He was advised of the need for long term treatment and the importance of lifestyle modifications to improve his current health and to decrease his risk of future health problems.  AGREE: Multiple dietary modification options and treatment options were discussed and Samuel [name] agreed to follow the recommendations documented in the above note.  ARRANGE: Samuel [name] was educated on the importance of frequent visits to treat obesity as outlined per CMS and USPSTF guidelines and agreed to schedule his next follow up appointment today.  Attestation Statements:   Reviewed by clinician on day of visit: allergies, medications, problem list, medical history, surgical history, family history, social history, and previous encounter notes.   Time spent on visit including pre-visit chart review and post-visit care and charting was 40 minutes.   I, Davy Pique, RMA, am acting as Location manager for [employer], DO.  I have reviewed the above documentation for accuracy and completeness, and I agree with the above. -  ***

## 2021-12-23 ENCOUNTER — Other Ambulatory Visit: Payer: Self-pay | Admitting: Family Medicine

## 2021-12-26 ENCOUNTER — Other Ambulatory Visit: Payer: Self-pay | Admitting: Family Medicine

## 2021-12-27 DIAGNOSIS — Z1211 Encounter for screening for malignant neoplasm of colon: Secondary | ICD-10-CM | POA: Diagnosis not present

## 2021-12-28 ENCOUNTER — Other Ambulatory Visit: Payer: Self-pay | Admitting: Family Medicine

## 2021-12-29 DIAGNOSIS — N5201 Erectile dysfunction due to arterial insufficiency: Secondary | ICD-10-CM | POA: Diagnosis not present

## 2021-12-29 DIAGNOSIS — R3915 Urgency of urination: Secondary | ICD-10-CM | POA: Diagnosis not present

## 2021-12-29 DIAGNOSIS — N401 Enlarged prostate with lower urinary tract symptoms: Secondary | ICD-10-CM | POA: Diagnosis not present

## 2021-12-30 NOTE — Progress Notes (Signed)
[facility] Note  01/06/2022     CHIEF COMPLAINT Patient presents for Retina Follow Up    HISTORY OF PRESENT ILLNESS: Samuel [name] is a 71 y.o. male who presents to the clinic today for:   HPI     Retina Follow Up   Patient presents with  Retinal Break/Detachment.  In right eye.  Duration of 1 year.  Since onset it is stable.  I, the attending physician,  performed the HPI with the patient and updated documentation appropriately.        Comments   Pt states vision has been "okay" over the past year, no fol, occasional floaters in the left eye, using Cosopt once a day OU      Last edited by Bernarda Caffey, MD on 01/08/2022  2:15 AM.    pt states VA is stable, he has to have hip sx in August   Referring physician: Eulas Post, MD [place],  [place] [id number]  HISTORICAL INFORMATION:   Selected notes from the MEDICAL RECORD NUMBER Referred by Dr. Marica Otter for concern of RD OD LEE: 3.18.20 Ammie Ferrier)  Ocular Hx- PMH-    CURRENT MEDICATIONS: Current Outpatient Medications (Ophthalmic Drugs)  Medication Sig   dorzolamide-timolol (COSOPT) 22.3-6.8 MG/ML ophthalmic solution INSTILL ONE DROP IN [facility] EYE DAILY   No current facility-administered medications for this visit. (Ophthalmic Drugs)   Current Outpatient Medications (Other)  Medication Sig   alclomethasone (ACLOVATE) 0.05 % cream Apply topically 2 (two) times daily as needed (Rash).   allopurinol (ZYLOPRIM) 100 MG tablet Take 2 tablets by mouth daily   aspirin EC 81 MG tablet Take 81 mg by mouth daily. Swallow whole.   atorvastatin (LIPITOR) 20 MG tablet TAKE ONE TABLET BY MOUTH DAILY   clobetasol (OLUX) 0.05 % topical foam APPLY TO AFFECTED AREA(S) TWO TIMES A DAY   colchicine 0.6 MG tablet IN GOUT FLARE, TAKE 2 TABLETS BY MOUTH AND THEN TAKE ONE TABLET ONE HOUR LATER   famotidine (PEPCID) 20 MG tablet Take 20 mg by mouth daily.    glucosamine-chondroitin 500-400 MG tablet Take 1 tablet by mouth daily.   hydrochlorothiazide (HYDRODIURIL) 25 MG tablet TAKE 1 TABLET(25 MG) BY MOUTH DAILY   indomethacin (INDOCIN) 25 MG capsule TAKE ONE CAPSULE BY MOUTH DAILY   ketoconazole (NIZORAL) 2 % cream Apply 1 application topically in the morning and at bedtime.   lisinopril (ZESTRIL) 20 MG tablet TAKE 1 TABLET(20 MG) BY MOUTH DAILY   metoprolol tartrate (LOPRESSOR) 50 MG tablet TAKE 1 TABLET(50 MG) BY MOUTH DAILY   Misc Natural Products (MENS PROSTATE HEALTH FORMULA PO) Take 1 tablet by mouth daily.   Multiple Vitamin (MULTIVITAMIN) tablet Take 1 tablet by mouth daily.   sildenafil (VIAGRA) 100 MG tablet TAKE 1/2 TO 1  TABLET BY MOUTH DAILY AS NEEDED FOR ERECTILE DYSFUNCTION  THIS IS NOT A DAILY MEDICATION   silodosin (RAPAFLO) 8 MG CAPS capsule silodosin 8 mg capsule   UNABLE TO FIND Take 1 tablet by mouth daily. Med Name: Relief Factor.   No current facility-administered medications for this visit. (Other)   REVIEW OF SYSTEMS: ROS   Positive for: Eyes Negative for: Constitutional, Gastrointestinal, Neurological, Skin, Genitourinary, Musculoskeletal, HENT, Endocrine, Cardiovascular, Respiratory, Psychiatric, Allergic/Imm, Heme/Lymph Last edited by Debbrah Alar, COT on 01/06/2022  9:13 AM.     ALLERGIES Allergies  Allergen Reactions   Penicillins     REACTION: hives   PAST  MEDICAL HISTORY 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   severe- right anterior shoulder- (EXC)   Atypical nevus 07/18/2011   mild-mod-right upper abdomen   Back pain    Basal cell carcinoma 07/17/2019   nod-left forearm-([id number])   COVID 01/07/2021   EPISTAXIS, RECURRENT 12/21/2009   ESSENTIAL HYPERTENSION 12/21/2009   Hypertension    Joint pain    Other fatigue    Prediabetes    Retinal detachment    OD   Shortness of breath on exertion     VENTRICULAR HYPERTROPHY, LEFT 01/04/2010   Past Surgical History:  Procedure Laterality Date   CATARACT EXTRACTION Bilateral 2009   EYE SURGERY Bilateral 2009   cataract   GAS/FLUID EXCHANGE Right 09/26/2018   Procedure: Gas/Fluid Exchange;  Surgeon: Bernarda Caffey, MD;  Location: [facility];  Service: Ophthalmology;  Laterality: Right;   PHOTOCOAGULATION WITH LASER Right 09/26/2018   Procedure: Photocoagulation With Laser;  Surgeon: Bernarda Caffey, MD;  Location: [facility];  Service: Ophthalmology;  Laterality: Right;   SCLERAL BUCKLE Right 09/26/2018   Procedure: Scleral Buckle;  Surgeon: Bernarda Caffey, MD;  Location: [facility];  Service: Ophthalmology;  Laterality: Right;   VITRECTOMY 25 GAUGE WITH SCLERAL BUCKLE Right 09/26/2018   Procedure: VITRECTOMY 25 GAUGE;  Surgeon: Bernarda Caffey, MD;  Location: [facility];  Service: Ophthalmology;  Laterality: Right;   FAMILY HISTORY Family History  Problem Relation Age of Onset   Cancer Mother        bladder CA   Hypertension Mother    Hypertension Father    Heart disease Father    Heart disease Maternal Grandfather    Hypertension Maternal Grandfather    Cancer Paternal Grandmother        ovarian CA   Heart disease Paternal Grandfather    Hypertension Paternal Grandfather    SOCIAL HISTORY Social History   Tobacco Use   Smoking status: Former    Packs/day: 1.00    Years: 18.00    Total pack years: 18.00    Types: Cigars, Cigarettes    Quit date: 08/22/1988    Years since quitting: 33.4   Smokeless tobacco: Never  Vaping Use   Vaping Use: Never used  Substance Use Topics   Alcohol use: Yes    Comment: social   Drug use: Never       OPHTHALMIC EXAM:  Base Eye Exam     Visual Acuity (Snellen - Linear)       Right Left   Dist [place]  20/20 -1   Dist cc 20/25 -2    Dist ph [place]  NI   Dist ph cc 20/20   Wearing CL OD        Tonometry (Tonopen, 9:18 AM)       Right Left   Pressure 10 13         Pupils       Dark Light Shape React  APD   Right 3 2 Round Minimal None   Left 3 2 Round Minimal None         Visual Fields (Counting fingers)       Left Right    Full Full         Extraocular Movement       Right Left    Full, Ortho Full, Ortho         Neuro/Psych     Oriented x3:  Yes   Mood/Affect: Normal         Dilation     Both eyes: 1.0% Mydriacyl, 2.5% Phenylephrine @ 9:21 AM           Slit Lamp and Fundus Exam     Slit Lamp Exam       Right Left   Lids/Lashes Dermatochalasis - upper lid Dermatochalasis - upper lid   Conjunctiva/Sclera White and quiet White and quiet   Cornea 1-2+ PEE, inferior, paracentral corneal haze, mild tear film debris, mild arcus mild Arcus, trace Punctate epithelial erosions, mild tear film debris   Anterior Chamber Deep and quiet Deep and quiet   Iris Round and dilated Round and dilated   Lens 3 piece sulcus IOL in good position, open PC 3 piece IOL, open PC, ?sulcus   Anterior Vitreous post vitrectomy, clear Mild syneresis, vitreous condensations, Posterior vitreous detachment, prominent vitreous condensation IT quad         Fundus Exam       Right Left   Disc Pink and Sharp Pink and Sharp, mild tilt   C/D Ratio 0.5 0.5   Macula Flat, blunted foveal reflex, RPE mottling, focal, nasal ERM with striae SN to fovea, no heme or edema Flat, Good foveal reflex, Retinal pigment epithelial mottling, trace Epiretinal membrane, No heme or edema   Vessels attenuated, Tortuous, mild AV crossing changes mild attenuation, mild tortuosity   Periphery retina attached over buckle, good buckle height, good laser over buckle; Original detachment: bullous inferior detachment from 0230-0930 Attached, peripheral cystoid degeneration, no RT/RD           IMAGING AND PROCEDURES  Imaging and Procedures for _0 @  OCT, Retina - OU - Both Eyes       Right Eye Quality was good. Central Foveal Thickness: 328. Progression has worsened. Findings include normal foveal  contour, no IRF, no SRF, epiretinal membrane, outer retinal atrophy (Retina re-attached, macula re-attached, good foveal depression, mild progression of nasal ERM with mild pucker).   Left Eye Quality was good. Central Foveal Thickness: 300. Progression has been stable. Findings include normal foveal contour, no IRF, no SRF (Trace ERM).   Notes *Images captured and stored on drive  Diagnosis / Impression:  OD: NFP, no IRF/SRF, Retina re-attached, macula re-attached, good foveal depression, mild progression of nasal ERM with mild pucker OS: NFP, no IRF/SRF   Clinical management:  See below  Abbreviations: NFP - Normal foveal profile. CME - cystoid macular edema. PED - pigment epithelial detachment. IRF - intraretinal fluid. SRF - subretinal fluid. EZ - ellipsoid zone. ERM - epiretinal membrane. ORA - outer retinal atrophy. ORT - outer retinal tubulation. SRHM - subretinal hyper-reflective material            ASSESSMENT/PLAN:    ICD-10-CM   1. Right retinal detachment  H33.21     2. Epiretinal membrane (ERM) of right eye  H35.371 OCT, Retina - OU - Both Eyes    3. 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 Rhegmatogenous retinal detachment, RIGHT EYE  - bullous inferior mac off detachment, 0230-0930, fovea off  - onset of foveal involvement Wednesday, 03.18.20 by history  - s/p SBP + PPV/PFC/EL/FAX/14% C3F8 OD, 03.19.2020             - retina attached and in good position -- good buckle height and laser around breaks   - BCVA: 20/20 (PH)  -  cont Cosopt Qdaily OU  - cont Artificial tears QID PRN OD  - f/u 61yr 2. Epiretinal membrane, OD   - mild progression of ERM with mild pucker  - asymptomatic, no metamorphopsia  - no indication for surgery at this time  - monitor -- f/u 1 yr  3. Pseudophakia OU  - s/p CE/IOL - Dr. EDolores Lory(2010)  - sulcus IOLs  - monitor   4. Ocular hypertension OU  - IOP 10,13 and stably good on  Cosopt qd OU   Ophthalmic Meds Ordered this visit:  No orders of the defined types were placed in this encounter.    Return in about 1 year (around 01/07/2023) for f/u RD / ERM OD, DFE, OCT.  There are no Patient Instructions on file for this visit.   Explained the diagnoses, plan, and follow up with the patient and they expressed understanding.  Patient expressed understanding of the importance of proper follow up care.   This document serves as a record of services personally performed by BGardiner Sleeper MD, PhD. It was created on their behalf by RLeonie Douglas an ophthalmic technician. The creation of this record is the provider's dictation and/or activities during the visit.    Electronically signed by: RLeonie DouglasCOA, 01/08/22  2:27 AM  This document serves as a record of services personally performed by BGardiner Sleeper MD, PhD. It was created on their behalf by ASan Jetty BOwens Shark OA an ophthalmic technician. The creation of this record is the provider's dictation and/or activities during the visit.    Electronically signed by: ASan Jetty BOwens Shark ONew [place]06.30.2023 2:27 AM  BGardiner Sleeper M.D., Ph.D. Diseases & Surgery of the Retina and Vitreous [facility] I have reviewed the above documentation for accuracy and completeness, and I agree with the above. BGardiner Sleeper M.D., Ph.D. 01/08/22 2:29 AM   Abbreviations: M myopia (nearsighted); A astigmatism; H hyperopia (farsighted); P presbyopia; Mrx spectacle prescription;  CTL contact lenses; OD right eye; OS left eye; OU both eyes  XT exotropia; ET esotropia; PEK punctate epithelial keratitis; PEE punctate epithelial erosions; DES dry eye syndrome; MGD meibomian gland dysfunction; ATs artificial tears; PFAT's preservative free artificial tears; NHillnuclear sclerotic cataract; PSC posterior subcapsular cataract; ERM epi-retinal membrane; PVD posterior vitreous detachment; RD retinal detachment; DM diabetes  mellitus; DR diabetic retinopathy; NPDR non-proliferative diabetic retinopathy; PDR proliferative diabetic retinopathy; CSME clinically significant macular edema; DME diabetic macular edema; dbh dot blot hemorrhages; CWS cotton wool spot; POAG primary open angle glaucoma; C/D cup-to-disc ratio; HVF humphrey visual field; GVF goldmann visual field; OCT optical coherence tomography; IOP intraocular pressure; BRVO Branch retinal vein occlusion; CRVO central retinal vein occlusion; CRAO central retinal artery occlusion; BRAO branch retinal artery occlusion; RT retinal tear; SB scleral buckle; PPV pars plana vitrectomy; VH Vitreous hemorrhage; PRP panretinal laser photocoagulation; IVK intravitreal kenalog; VMT vitreomacular traction; MH Macular hole;  NVD neovascularization of the disc; NVE neovascularization elsewhere; AREDS age related eye disease study; ARMD age related macular degeneration; POAG primary open angle glaucoma; EBMD epithelial/anterior basement membrane dystrophy; ACIOL anterior chamber intraocular lens; IOL intraocular lens; PCIOL posterior chamber intraocular lens; Phaco/IOL phacoemulsification with intraocular lens placement; PSomertonphotorefractive keratectomy; LASIK laser assisted in situ keratomileusis; HTN hypertension; DM diabetes mellitus; COPD chronic obstructive pulmonary disease

## 2022-01-02 LAB — COLOGUARD: COLOGUARD: POSITIVE — AB

## 2022-01-05 ENCOUNTER — Ambulatory Visit (INDEPENDENT_AMBULATORY_CARE_PROVIDER_SITE_OTHER): Payer: Medicare Other | Admitting: Family Medicine

## 2022-01-05 ENCOUNTER — Encounter (INDEPENDENT_AMBULATORY_CARE_PROVIDER_SITE_OTHER): Payer: Self-pay | Admitting: Family Medicine

## 2022-01-05 VITALS — BP 135/84 | HR 49 | Temp 98.0°F | Ht 69.0 in | Wt 237.0 lb

## 2022-01-05 DIAGNOSIS — M1610 Unilateral primary osteoarthritis, unspecified hip: Secondary | ICD-10-CM | POA: Diagnosis not present

## 2022-01-05 DIAGNOSIS — E7849 Other hyperlipidemia: Secondary | ICD-10-CM

## 2022-01-05 DIAGNOSIS — E669 Obesity, unspecified: Secondary | ICD-10-CM

## 2022-01-05 DIAGNOSIS — K760 Fatty (change of) liver, not elsewhere classified: Secondary | ICD-10-CM

## 2022-01-05 DIAGNOSIS — R7303 Prediabetes: Secondary | ICD-10-CM | POA: Diagnosis not present

## 2022-01-05 DIAGNOSIS — Z6835 Body mass index (BMI) 35.0-35.9, adult: Secondary | ICD-10-CM | POA: Diagnosis not present

## 2022-01-06 ENCOUNTER — Encounter (INDEPENDENT_AMBULATORY_CARE_PROVIDER_SITE_OTHER): Payer: Self-pay | Admitting: Ophthalmology

## 2022-01-06 ENCOUNTER — Ambulatory Visit (INDEPENDENT_AMBULATORY_CARE_PROVIDER_SITE_OTHER): Payer: Medicare Other | Admitting: Ophthalmology

## 2022-01-06 DIAGNOSIS — H35371 Puckering of macula, right eye: Secondary | ICD-10-CM | POA: Diagnosis not present

## 2022-01-06 DIAGNOSIS — H40053 Ocular hypertension, bilateral: Secondary | ICD-10-CM | POA: Diagnosis not present

## 2022-01-06 DIAGNOSIS — H35372 Puckering of macula, left eye: Secondary | ICD-10-CM

## 2022-01-06 DIAGNOSIS — H3321 Serous retinal detachment, right eye: Secondary | ICD-10-CM

## 2022-01-06 DIAGNOSIS — Z961 Presence of intraocular lens: Secondary | ICD-10-CM | POA: Diagnosis not present

## 2022-01-07 ENCOUNTER — Other Ambulatory Visit: Payer: Self-pay | Admitting: Physician Assistant

## 2022-01-07 DIAGNOSIS — B353 Tinea pedis: Secondary | ICD-10-CM

## 2022-01-08 ENCOUNTER — Other Ambulatory Visit: Payer: Self-pay | Admitting: Family Medicine

## 2022-01-08 ENCOUNTER — Encounter (INDEPENDENT_AMBULATORY_CARE_PROVIDER_SITE_OTHER): Payer: Self-pay | Admitting: Ophthalmology

## 2022-01-09 NOTE — Progress Notes (Signed)
Chief Complaint:   OBESITY Samuel [name] is here to discuss his progress with his obesity treatment plan along with follow-up of his obesity related diagnoses. Samuel [name] is on the Category 3 Plan and states he is following his eating plan approximately 75% of the time. Samuel [name] states he is not exercising.  Today's visit was #: 14 Starting weight: 269 lbs Starting date: 03/01/2021 Today's weight: 237 lbs Today's date: 01/05/2022 Total lbs lost to date: 32 lbs Total lbs lost since last in-office visit: 2 lbs  Interim History: Samuel [name] is here for a follow up office visit.  We reviewed his meal plan and all questions were answered.  Patient's food recall appears to be accurate and consistent with what is on plan when he is following it.   When eating on plan, his hunger and cravings are well controlled.  He is having hip surgery on February 09, 2022.  Samuel [name] is here to review labs obtained at last visit.  FLP, FI, A1c, CMP.   Subjective:   1. Pre-diabetes Worsening. Discussed labs with patient today. Samuel [name] has a diagnosis of prediabetes based on his elevated HgA1c and was informed this puts him at greater risk of developing diabetes. He continues to work on diet and exercise to decrease his risk of diabetes. He denies nausea or hypoglycemia.  Samuel [name] denies any cravings and he is not taking any medications at this time.   2. Other hyperlipidemia Discussed labs with patient today. Samuel [name] is tolerating medication(s) well without side effects.  Medication compliance is good as patient endorses taking it as prescribed.  The patient denies additional concerns regarding this condition.  He is taking Lipitor.     3. NAFLD (nonalcoholic fatty liver disease) Discussed labs with patient today. Samuel [name] has a dx of elevated ALT. His BMI is over 40. He denies abdominal pain or jaundice and has never been told of any liver problems in the past. He denies excessive alcohol intake.   4. Arthritis of  hip, chronic Takes 2 Advil OTC for hip pain as needed. His serum creatinine increased to 1.42.  (up from 1.25).   Assessment/Plan:  No orders of the defined types were placed in this encounter.   There are no discontinued medications.   No orders of the defined types were placed in this encounter.    1. Pre-diabetes Samuel [name] will continue to work on weight loss, exercise, and decreasing simple carbohydrates to help decrease the risk of diabetes.  A1c has increased from prior labs.  He is declining medication at this time.   2. Other hyperlipidemia Cardiovascular risk and specific lipid/LDL goals reviewed.  We discussed several lifestyle modifications today and Samuel [name] will continue to work on diet, exercise and weight loss efforts. Orders and follow up as documented in patient record.   Counseling Intensive lifestyle modifications are the first line treatment for this issue. Dietary changes: Increase soluble fiber. Decrease simple carbohydrates. Exercise changes: Moderate to vigorous-intensity aerobic activity 150 minutes per week if tolerated. Lipid-lowering medications: see documented in medical record. Continue Lipitor, he is at goal.  3. NAFLD (nonalcoholic fatty liver disease) We discussed the likely diagnosis of non-alcoholic fatty liver disease today and how this condition is obesity related. Samuel [name] was educated the importance of weight loss. Samuel [name] agreed to continue with his weight loss efforts with healthier diet and exercise as an essential part of his treatment plan.  ALT and AST are within normal limits  4. Arthritis of hip, chronic  Uses Indocin daily in the morning for osteoarthritis pain Samuel [name] likely took 2 Advil, expressed and explained to Samuel [name] that he cannot take both.  Encouraged to try Tylenol as need or ice the area. Recheck labs in 3 months.   5. Obesity, Current BMI 35.1 Samuel [name] is currently in the action stage of change. As such, his goal is to continue with  weight loss efforts. He has agreed to the Category 3 Plan.   Exercise goals: All adults should avoid inactivity. Some physical activity is better than none, and adults who participate in any amount of physical activity gain some health benefits.  Behavioral modification strategies: increasing water intake and meal planning and cooking strategies.  Samuel [name] has agreed to follow-up with our clinic in 3 weeks. He was informed of the importance of frequent follow-up visits to maximize his success with intensive lifestyle modifications for his multiple health conditions.   Objective:   Blood pressure 135/84, pulse (!) 49, temperature 98 F (36.7 C), height '5\' 9"'$  (1.753 m), weight 237 lb (107.5 kg), SpO2 98 %. Body mass index is 35 kg/m.  General: Cooperative, alert, well developed, in no acute distress. HEENT: Conjunctivae and lids unremarkable. Cardiovascular: Regular rhythm.  Lungs: Normal work of breathing. Neurologic: No focal deficits.   Lab Results  Component Value Date   CREATININE 1.42 (H) 12/15/2021   BUN 32 (H) 12/15/2021   NA 137 12/15/2021   K 5.0 12/15/2021   CL 99 12/15/2021   CO2 22 12/15/2021   Lab Results  Component Value Date   ALT 31 12/15/2021   AST 29 12/15/2021   ALKPHOS 65 12/15/2021   BILITOT 1.0 12/15/2021   Lab Results  Component Value Date   HGBA1C 5.7 (H) 12/15/2021   HGBA1C 5.6 07/21/2021   HGBA1C 5.8 (H) 03/01/2021   HGBA1C 5.8 10/29/2020   HGBA1C 5.9 11/04/2019   Lab Results  Component Value Date   INSULIN 15.7 12/15/2021   INSULIN 23.9 07/21/2021   INSULIN 35.6 (H) 03/01/2021   Lab Results  Component Value Date   TSH 2.130 03/01/2021   Lab Results  Component Value Date   CHOL 120 12/15/2021   HDL 48 12/15/2021   LDLCALC 55 12/15/2021   TRIG 91 12/15/2021   CHOLHDL 2.5 12/15/2021   No results found for: "VD25OH" Lab Results  Component Value Date   WBC 9.5 03/01/2021   HGB 15.8 03/01/2021   HCT 45.6 03/01/2021   MCV 94  03/01/2021   PLT 300 03/01/2021   No results found for: "IRON", "TIBC", "FERRITIN"  Obesity Behavioral Intervention:   Approximately 15 minutes were spent on the discussion below.  ASK: We discussed the diagnosis of obesity with Samuel [name] today and Kristine agreed to give [place] permission to discuss obesity behavioral modification therapy today.  ASSESS: Yani has the diagnosis of obesity and his BMI today is 35.1. Brit is in the action stage of change.   ADVISE: Ector was educated on the multiple health risks of obesity as well as the benefit of weight loss to improve his health. He was advised of the need for long term treatment and the importance of lifestyle modifications to improve his current health and to decrease his risk of future health problems.  AGREE: Multiple dietary modification options and treatment options were discussed and Harrell agreed to follow the recommendations documented in the above note.  ARRANGE: Samuel [name] was educated on the importance of frequent visits to treat obesity as outlined per CMS and USPSTF guidelines and  agreed to schedule his next follow up appointment today.  Attestation Statements:   Reviewed by clinician on day of visit: allergies, medications, problem list, medical history, surgical history, family history, social history, and previous encounter notes.  I, Davy Pique, RMA, am acting as Location manager for [employer], DO.   I have reviewed the above documentation for accuracy and completeness, and I agree with the above. Marjory Sneddon, D.O.  The [place] was signed into law in 2016 which includes the topic of electronic health records.  This provides immediate access to information in MyChart.  This includes consultation notes, operative notes, office notes, lab results and pathology reports.  If you have any questions about what you read please let us know at your next visit so we can discuss your concerns and take  corrective action if need be.  We are right here with you.

## 2022-01-19 ENCOUNTER — Ambulatory Visit (INDEPENDENT_AMBULATORY_CARE_PROVIDER_SITE_OTHER): Payer: Medicare Other | Admitting: Family Medicine

## 2022-01-19 ENCOUNTER — Encounter (INDEPENDENT_AMBULATORY_CARE_PROVIDER_SITE_OTHER): Payer: Self-pay

## 2022-01-19 ENCOUNTER — Encounter: Payer: Self-pay | Admitting: Gastroenterology

## 2022-01-22 ENCOUNTER — Other Ambulatory Visit: Payer: Self-pay | Admitting: Family Medicine

## 2022-02-09 DIAGNOSIS — R3915 Urgency of urination: Secondary | ICD-10-CM | POA: Diagnosis not present

## 2022-02-09 DIAGNOSIS — N401 Enlarged prostate with lower urinary tract symptoms: Secondary | ICD-10-CM | POA: Diagnosis not present

## 2022-02-10 ENCOUNTER — Telehealth: Payer: Self-pay | Admitting: Family Medicine

## 2022-02-10 DIAGNOSIS — R202 Paresthesia of skin: Secondary | ICD-10-CM | POA: Diagnosis not present

## 2022-02-10 DIAGNOSIS — M5010 Cervical disc disorder with radiculopathy, unspecified cervical region: Secondary | ICD-10-CM | POA: Diagnosis not present

## 2022-02-10 NOTE — Telephone Encounter (Signed)
Sherry at [facility] is requesting the CBC lab results for this patient.  Please fax to  [phone]

## 2022-02-13 ENCOUNTER — Other Ambulatory Visit: Payer: Medicare Other

## 2022-02-13 ENCOUNTER — Telehealth: Payer: Self-pay | Admitting: Family Medicine

## 2022-02-13 DIAGNOSIS — M5412 Radiculopathy, cervical region: Secondary | ICD-10-CM | POA: Diagnosis not present

## 2022-02-13 DIAGNOSIS — Z01818 Encounter for other preprocedural examination: Secondary | ICD-10-CM

## 2022-02-13 DIAGNOSIS — M542 Cervicalgia: Secondary | ICD-10-CM | POA: Diagnosis not present

## 2022-02-13 NOTE — Telephone Encounter (Signed)
Please see prior encounter.  

## 2022-02-13 NOTE — Telephone Encounter (Signed)
Labs ordered.

## 2022-02-13 NOTE — Telephone Encounter (Signed)
Pt having surgery on Thursday, provider performing surgery requests patient to have cbc, Dr Alvan Dame

## 2022-02-14 ENCOUNTER — Telehealth: Payer: Self-pay | Admitting: Family Medicine

## 2022-02-14 LAB — CBC WITH DIFFERENTIAL/PLATELET
Basophils Absolute: 0.1 10*3/uL (ref 0.0–0.1)
Basophils Relative: 0.8 % (ref 0.0–3.0)
Eosinophils Absolute: 0 10*3/uL (ref 0.0–0.7)
Eosinophils Relative: 0 % (ref 0.0–5.0)
HCT: 42.9 % (ref 39.0–52.0)
Hemoglobin: 14.3 g/dL (ref 13.0–17.0)
Lymphocytes Relative: 12.3 % (ref 12.0–46.0)
Lymphs Abs: 2.1 10*3/uL (ref 0.7–4.0)
MCHC: 33.3 g/dL (ref 30.0–36.0)
MCV: 94.6 fl (ref 78.0–100.0)
Monocytes Absolute: 1.2 10*3/uL — ABNORMAL HIGH (ref 0.1–1.0)
Monocytes Relative: 7.2 % (ref 3.0–12.0)
Neutro Abs: 13.4 10*3/uL — ABNORMAL HIGH (ref 1.4–7.7)
Neutrophils Relative %: 79.7 % — ABNORMAL HIGH (ref 43.0–77.0)
Platelets: 294 10*3/uL (ref 150.0–400.0)
RBC: 4.53 Mil/uL (ref 4.22–5.81)
RDW: 13.9 % (ref 11.5–15.5)
WBC: 16.9 10*3/uL — ABNORMAL HIGH (ref 4.0–10.5)

## 2022-02-14 NOTE — Telephone Encounter (Signed)
Pt having surgery on 02/16/22 and provider requests these results be faxed over once released so that they can forward to surgery center. Fax [phone]

## 2022-02-14 NOTE — Telephone Encounter (Signed)
Labs faxed

## 2022-02-15 ENCOUNTER — Encounter (INDEPENDENT_AMBULATORY_CARE_PROVIDER_SITE_OTHER): Payer: Self-pay

## 2022-02-16 DIAGNOSIS — M1611 Unilateral primary osteoarthritis, right hip: Secondary | ICD-10-CM | POA: Diagnosis not present

## 2022-02-16 HISTORY — PX: TOTAL HIP ARTHROPLASTY: SHX124

## 2022-02-26 ENCOUNTER — Other Ambulatory Visit: Payer: Self-pay | Admitting: Podiatry

## 2022-03-01 ENCOUNTER — Ambulatory Visit: Payer: Medicare Other

## 2022-03-01 ENCOUNTER — Other Ambulatory Visit: Payer: Self-pay | Admitting: Podiatry

## 2022-03-01 DIAGNOSIS — M542 Cervicalgia: Secondary | ICD-10-CM | POA: Diagnosis not present

## 2022-03-01 MED ORDER — COLCHICINE 0.6 MG PO TABS: 0.6000 mg | ORAL_TABLET | Freq: Every day | ORAL | 1 refills | Status: DC

## 2022-03-01 NOTE — Telephone Encounter (Signed)
refilled 

## 2022-03-02 ENCOUNTER — Ambulatory Visit (AMBULATORY_SURGERY_CENTER): Payer: Medicare Other | Admitting: *Deleted

## 2022-03-02 ENCOUNTER — Ambulatory Visit (INDEPENDENT_AMBULATORY_CARE_PROVIDER_SITE_OTHER): Payer: Medicare Other

## 2022-03-02 ENCOUNTER — Telehealth: Payer: Self-pay | Admitting: *Deleted

## 2022-03-02 VITALS — Ht 69.0 in | Wt 235.0 lb

## 2022-03-02 VITALS — Ht 69.0 in | Wt 233.0 lb

## 2022-03-02 DIAGNOSIS — Z0000 Encounter for general adult medical examination without abnormal findings: Secondary | ICD-10-CM

## 2022-03-02 DIAGNOSIS — Z1211 Encounter for screening for malignant neoplasm of colon: Secondary | ICD-10-CM

## 2022-03-02 MED ORDER — NA SULFATE-K SULFATE-MG SULF 17.5-3.13-1.6 GM/177ML PO SOLN: 1.0000 | ORAL | 0 refills | Status: DC

## 2022-03-02 NOTE — Patient Instructions (Addendum)
Mr. Samuel [name] , Thank you for taking time to come for your Medicare Wellness Visit. I appreciate your ongoing commitment to your health goals. Please review the following plan we discussed and let me know if I can assist you in the future.   These are the goals we discussed:  Goals       Patient Stated      Lose weight      Patient Stated      Lose weight       Patient stated (pt-stated)      I would like to get through work projects.        This is a list of the screening recommended for you and due dates:  Health Maintenance  Topic Date Due   Flu Shot  02/07/2022   COVID-19 Vaccine (3 - Moderna risk series) 03/18/2022*   Colon Cancer Screening  03/23/2022*   Tetanus Vaccine  03/03/2023*   Pneumonia Vaccine  Completed   Hepatitis C Screening: USPSTF Recommendation to screen - Ages 18-79 yo.  Completed   Zoster (Shingles) Vaccine  Completed   HPV Vaccine  Aged Out  *Topic was postponed. The date shown is not the original due date.    Advanced directives: Yes  Conditions/risks identified: No  Next appointment: Follow up in one year for your annual wellness visit.    Preventive Care 16 Years and Older, Male Preventive care refers to lifestyle choices and visits with your health care provider that can promote health and wellness. What does preventive care include? A yearly physical exam. This is also called an annual well check. Dental exams once or twice a year. Routine eye exams. Ask your health care provider how often you should have your eyes checked. Personal lifestyle choices, including: Daily care of your teeth and gums. Regular physical activity. Eating a healthy diet. Avoiding tobacco and drug use. Limiting alcohol use. Practicing safe sex. Taking low doses of aspirin every day. Taking vitamin and mineral supplements as recommended by your health care provider. What happens during an annual well check? The services and screenings done by your health care  provider during your annual well check will depend on your age, overall health, lifestyle risk factors, and family history of disease. Counseling  Your health care provider may ask you questions about your: Alcohol use. Tobacco use. Drug use. Emotional well-being. Home and relationship well-being. Sexual activity. Eating habits. History of falls. Memory and ability to understand (cognition). Work and work Statistician. Screening  You may have the following tests or measurements: Height, weight, and BMI. Blood pressure. Lipid and cholesterol levels. These may be checked every 5 years, or more frequently if you are over 51 years old. Skin check. Lung cancer screening. You may have this screening every year starting at age 38 if you have a 30-pack-year history of smoking and currently smoke or have quit within the past 15 years. Fecal occult blood test (FOBT) of the stool. You may have this test every year starting at age 77. Flexible sigmoidoscopy or colonoscopy. You may have a sigmoidoscopy every 5 years or a colonoscopy every 10 years starting at age 23. Prostate cancer screening. Recommendations will vary depending on your family history and other risks. Hepatitis C blood test. Hepatitis B blood test. Sexually transmitted disease (STD) testing. Diabetes screening. This is done by checking your blood sugar (glucose) after you have not eaten for a while (fasting). You may have this done every 1-3 years. Abdominal aortic aneurysm (AAA) screening.  You may need this if you are a current or former smoker. Osteoporosis. You may be screened starting at age 68 if you are at high risk. Talk with your health care provider about your test results, treatment options, and if necessary, the need for more tests. Vaccines  Your health care provider may recommend certain vaccines, such as: Influenza vaccine. This is recommended every year. Tetanus, diphtheria, and acellular pertussis (Tdap, Td)  vaccine. You may need a Td booster every 10 years. Zoster vaccine. You may need this after age 5. Pneumococcal 13-valent conjugate (PCV13) vaccine. One dose is recommended after age 92. Pneumococcal polysaccharide (PPSV23) vaccine. One dose is recommended after age 66. Talk to your health care provider about which screenings and vaccines you need and how often you need them. This information is not intended to replace advice given to you by your health care provider. Make sure you discuss any questions you have with your health care provider. Document Released: 07/23/2015 Document Revised: 03/15/2016 Document Reviewed: 04/27/2015 Elsevier Interactive Patient Education  2017 [place] Prevention in the Home Falls can cause injuries. They can happen to people of all ages. There are many things you can do to make your home safe and to help prevent falls. What can I do on the outside of my home? Regularly fix the edges of walkways and driveways and fix any cracks. Remove anything that might make you trip as you walk through a door, such as a raised step or threshold. Trim any bushes or trees on the path to your home. Use bright outdoor lighting. Clear any walking paths of anything that might make someone trip, such as rocks or tools. Regularly check to see if handrails are loose or broken. Make sure that both sides of any steps have handrails. Any raised decks and porches should have guardrails on the edges. Have any leaves, snow, or ice cleared regularly. Use sand or salt on walking paths during winter. Clean up any spills in your garage right away. This includes oil or grease spills. What can I do in the bathroom? Use night lights. Install grab bars by the toilet and in the tub and shower. Do not use towel bars as grab bars. Use non-skid mats or decals in the tub or shower. If you need to sit down in the shower, use a plastic, non-slip stool. Keep the floor dry. Clean up any  water that spills on the floor as soon as it happens. Remove soap buildup in the tub or shower regularly. Attach bath mats securely with double-sided non-slip rug tape. Do not have throw rugs and other things on the floor that can make you trip. What can I do in the bedroom? Use night lights. Make sure that you have a light by your bed that is easy to reach. Do not use any sheets or blankets that are too big for your bed. They should not hang down onto the floor. Have a firm chair that has side arms. You can use this for support while you get dressed. Do not have throw rugs and other things on the floor that can make you trip. What can I do in the kitchen? Clean up any spills right away. Avoid walking on wet floors. Keep items that you use a lot in easy-to-reach places. If you need to reach something above you, use a strong step stool that has a grab bar. Keep electrical cords out of the way. Do not use floor polish or wax  that makes floors slippery. If you must use wax, use non-skid floor wax. Do not have throw rugs and other things on the floor that can make you trip. What can I do with my stairs? Do not leave any items on the stairs. Make sure that there are handrails on both sides of the stairs and use them. Fix handrails that are broken or loose. Make sure that handrails are as long as the stairways. Check any carpeting to make sure that it is firmly attached to the stairs. Fix any carpet that is loose or worn. Avoid having throw rugs at the top or bottom of the stairs. If you do have throw rugs, attach them to the floor with carpet tape. Make sure that you have a light switch at the top of the stairs and the bottom of the stairs. If you do not have them, ask someone to add them for you. What else can I do to help prevent falls? Wear shoes that: Do not have high heels. Have rubber bottoms. Are comfortable and fit you well. Are closed at the toe. Do not wear sandals. If you use a  stepladder: Make sure that it is fully opened. Do not climb a closed stepladder. Make sure that both sides of the stepladder are locked into place. Ask someone to hold it for you, if possible. Clearly mark and make sure that you can see: Any grab bars or handrails. First and last steps. Where the edge of each step is. Use tools that help you move around (mobility aids) if they are needed. These include: Canes. Walkers. Scooters. Crutches. Turn on the lights when you go into a dark area. Replace any light bulbs as soon as they burn out. Set up your furniture so you have a clear path. Avoid moving your furniture around. If any of your floors are uneven, fix them. If there are any pets around you, be aware of where they are. Review your medicines with your doctor. Some medicines can make you feel dizzy. This can increase your chance of falling. Ask your doctor what other things that you can do to help prevent falls. This information is not intended to replace advice given to you by your health care provider. Make sure you discuss any questions you have with your health care provider. Document Released: 04/22/2009 Document Revised: 12/02/2015 Document Reviewed: 07/31/2014 Elsevier Interactive Patient Education  2017 [employer].

## 2022-03-02 NOTE — Progress Notes (Signed)
Subjective:   Samuel [name] is a 71 y.o. male who presents for Medicare Annual/Subsequent preventive examination.  Review of Systems    Virtual Visit via Telephone Note  I connected with  Samuel [name] on 03/02/22 at 12:30 PM EDT by telephone and verified that I am speaking with the correct person using two identifiers.  Location: Patient: Home Provider: Office Persons participating in the virtual visit: patient/Nurse Health Advisor   I discussed the limitations, risks, security and privacy concerns of performing an evaluation and management service by telephone and the availability of in person appointments. The patient expressed understanding and agreed to proceed.  Interactive audio and video telecommunications were attempted between this nurse and patient, however failed, due to patient having technical difficulties OR patient did not have access to video capability.  We continued and completed visit with audio only.  Some vital signs may be absent or patient reported.   Criselda Peaches, LPN  Cardiac Risk Factors include: advanced age (>64mn, >>30women);hypertension;male gender     Objective:    Today's Vitals   03/02/22 1242  Weight: 233 lb (105.7 kg)  Height: _0  (1.753 m)   Body mass index is 34.41 kg/m.     03/02/2022   12:51 PM 02/16/2021   11:25 AM 02/10/2020   11:19 AM 09/26/2018   10:25 AM  Advanced Directives  Does Patient Have a Medical Advance Directive? Yes Yes Yes Yes  Type of [place]amed[place]of [place] will Living will [place] will   Does patient want to make changes to medical advance directive? No - Patient declined   No - Patient declined  Copy of [place] Chart? No - copy requested  No - copy requested     Current Medications (verified) Outpatient Encounter Medications as of 03/02/2022  Medication Sig   alclomethasone (ACLOVATE) 0.05 % cream Apply topically 2 (two)  times daily as needed (Rash).   allopurinol (ZYLOPRIM) 100 MG tablet Take 2 tablets by mouth daily   aspirin EC 81 MG tablet Take 81 mg by mouth daily. Swallow whole.   atorvastatin (LIPITOR) 20 MG tablet TAKE ONE TABLET BY MOUTH DAILY   clobetasol (OLUX) 0.05 % topical foam APPLY TO AFFECTED AREA(S) TWO TIMES A DAY   colchicine 0.6 MG tablet Take 1 tablet (0.6 mg total) by mouth daily.   dorzolamide-timolol (COSOPT) 22.3-6.8 MG/ML ophthalmic solution INSTILL ONE DROP IN EACH EYE DAILY   famotidine (PEPCID) 20 MG tablet Take 20 mg by mouth daily.   glucosamine-chondroitin 500-400 MG tablet Take 1 tablet by mouth daily.   hydrochlorothiazide (HYDRODIURIL) 25 MG tablet TAKE 1 TABLET(25 MG) BY MOUTH DAILY   ketoconazole (NIZORAL) 2 % cream APPLY 1 APPLICATION TOPICALLY IN THE MORNING AND AT BEDTIME   lisinopril (ZESTRIL) 20 MG tablet TAKE 1 TABLET(20 MG) BY MOUTH DAILY   metoprolol tartrate (LOPRESSOR) 50 MG tablet TAKE 1 TABLET(50 MG) BY MOUTH DAILY   Multiple Vitamin (MULTIVITAMIN) tablet Take 1 tablet by mouth daily.   Na Sulfate-K Sulfate-Mg Sulf 17.5-3.13-1.6 GM/177ML SOLN Take 1 kit by mouth as directed. May use generic SUPREP;NO prior authorizations will be done.Please use Singlecare or GOOD-RX coupon.   sildenafil (VIAGRA) 100 MG tablet TAKE 1/2 TO 1 TABLET BY MOUTH DAILY AS NEEDED FOR FOR ERECTILE DYSFUNCTION **THIS IS NOT A DAILY MEDICATION   silodosin (RAPAFLO) 8 MG CAPS capsule silodosin 8 mg capsule   UNABLE TO FIND Take 1 tablet  by mouth daily. Med Name: Relief Factor.   Vibegron (GEMTESA) 75 MG TABS 1 tablet every day by oral route.   [DISCONTINUED] colchicine 0.6 MG tablet IN GOUT FLARE, TAKE 2 TABLETS BY MOUTH AND THEN TAKE ONE TABLET ONE HOUR LATER   [DISCONTINUED] indomethacin (INDOCIN) 25 MG capsule TAKE ONE CAPSULE BY MOUTH DAILY (Patient not taking: Reported on 03/02/2022)   [DISCONTINUED] Misc Natural Products (MENS PROSTATE HEALTH FORMULA PO) Take 1 tablet by mouth daily.  (Patient not taking: Reported on 03/02/2022)   No facility-administered encounter medications on file as of 03/02/2022.    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   severe- right anterior shoulder- (EXC)   Atypical nevus 07/18/2011   mild-mod-right upper abdomen   Back pain    Basal cell carcinoma 07/17/2019   nod-left forearm-([id number])   COVID 01/07/2021   EPISTAXIS, RECURRENT 12/21/2009   ESSENTIAL HYPERTENSION 12/21/2009   Hypertension    Joint pain    Other fatigue    Prediabetes    Retinal detachment    OD   Shortness of breath on exertion    VENTRICULAR HYPERTROPHY, LEFT 01/04/2010   Past Surgical History:  Procedure Laterality Date   CATARACT EXTRACTION Bilateral 2009   EYE SURGERY Bilateral 2009   cataract   GAS/FLUID EXCHANGE Right 09/26/2018   Procedure: Gas/Fluid Exchange;  Surgeon: Bernarda Caffey, MD;  Location: [facility];  Service: Ophthalmology;  Laterality: Right;   PHOTOCOAGULATION WITH LASER Right 09/26/2018   Procedure: Photocoagulation With Laser;  Surgeon: Bernarda Caffey, MD;  Location: [place];  Service: Ophthalmology;  Laterality: Right;   SCLERAL BUCKLE Right 09/26/2018   Procedure: Scleral Buckle;  Surgeon: Bernarda Caffey, MD;  Location: [place];  Service: Ophthalmology;  Laterality: Right;   TOTAL HIP ARTHROPLASTY Right 02/16/2022   VITRECTOMY 25 GAUGE WITH SCLERAL BUCKLE Right 09/26/2018   Procedure: VITRECTOMY 25 GAUGE;  Surgeon: Bernarda Caffey, MD;  Location: [place];  Service: Ophthalmology;  Laterality: Right;   Family History  Problem Relation Age of Onset   Cancer Mother        bladder CA   Hypertension Mother    Hypertension Father    Heart disease Father    Heart disease Maternal Grandfather    Hypertension Maternal Grandfather    Cancer Paternal Grandmother        ovarian CA   Heart disease Paternal  Grandfather    Hypertension Paternal Grandfather    Colon cancer Neg Hx    Social History   Socioeconomic History   Marital status: Legally Separated    Spouse name: Not on file   Number of children: Not on file   Years of education: Not on file   Highest education level: Not on file  Occupational History   Occupation: Insuarnce agent  Tobacco Use   Smoking status: Some Days    Packs/day: 1.00    Years: 18.00    Total pack years: 18.00    Types: Cigars, Cigarettes    Last attempt to quit: 08/22/1988    Years since quitting: 33.5   Smokeless tobacco: Never  Vaping Use   Vaping Use: Never used  Substance and Sexual Activity   Alcohol use: Yes    Alcohol/week: 14.0 standard drinks of alcohol    Types: 14 Standard drinks or equivalent per week    Comment: social   Drug  use: Never   Sexual activity: Not Currently    Partners: Female  Other Topics Concern   Not on file  Social History Narrative   Not on file   Social Determinants of Health   Financial Resource Strain: Low Risk  (03/02/2022)   Overall Financial Resource Strain (CARDIA)    Difficulty of Paying Living Expenses: Not hard at all  Food Insecurity: No Food Insecurity (03/02/2022)   Hunger Vital Sign    Worried About Running Out of Food in the Last Year: Never true    Ran Out of Food in the Last Year: Never true  Transportation Needs: No Transportation Needs (03/02/2022)   PRAPARE - Hydrologist (Medical): No    Lack of Transportation (Non-Medical): No  Physical Activity: Inactive (03/02/2022)   Exercise Vital Sign    Days of Exercise per Week: 0 days    Minutes of Exercise per Session: 0 min  Stress: No Stress Concern Present (03/02/2022)   Elexius Minar    Feeling of Stress : Not at all  Social Connections: Moderately Integrated (03/02/2022)   Social Connection and Isolation Panel [NHANES]    Frequency of Communication  with Friends and Family: More than three times a week    Frequency of Social Gatherings with Friends and Family: More than three times a week    Attends Religious Services: More than 4 times per year    Active Member of [employer] or Organizations: Yes    Attends Music therapist: More than 4 times per year    Marital Status: Separated    Tobacco Counseling Ready to quit: No Counseling given: Yes   Clinical Intake:  Pre-visit preparation completed: No  Pain : No/denies pain     BMI - recorded: 34.98 Nutritional Status: BMI > 30  Obese Nutritional Risks: None Diabetes: No  How often do you need to have someone help you when you read instructions, pamphlets, or other written materials from your doctor or pharmacy?: 1 - Never  Diabetic?  No  Interpreter Needed?: No  Information entered by :: Rolene Arbour LPN   Activities of Daily Living    03/02/2022   12:48 PM  In your present state of health, do you have any difficulty performing the following activities:  Hearing? 0  Vision? 0  Difficulty concentrating or making decisions? 0  Walking or climbing stairs? 0  Dressing or bathing? 0  Doing errands, shopping? 0  Preparing Food and eating ? N  Using the Toilet? N  In the past six months, have you accidently leaked urine? Y  Comment Followed by Urologist  Do you have problems with loss of bowel control? N  Managing your Medications? N  Managing your Finances? N  Housekeeping or managing your Housekeeping? N    Patient Care Team: Eulas Post, MD as PCP - [facility] [place], Ronalee Red, PA-C as Physician Assistant (Dermatology)  Indicate any recent Medical Services you may have received from other than [facility] providers in the past year (date may be approximate).     Assessment:   This is a routine wellness examination for Samuel [name].  Hearing/Vision screen Hearing Screening - Comments:: No hearing difficulty Vision Screening - Comments:: Wears  contacts. Followed by Dr Sabra Heck  Dietary issues and exercise activities discussed: Exercise limited by: orthopedic condition(s)   Goals Addressed               This Visit's  Progress     Patient stated (pt-stated)        I would like to get through work projects.       Depression Screen    03/02/2022   12:46 PM 12/13/2021   11:11 AM 03/01/2021    9:53 AM 02/16/2021   11:22 AM 02/10/2020   11:21 AM 11/04/2019    8:21 AM 08/14/2018    8:10 AM  PHQ 2/9 Scores  PHQ - 2 Score 0 0 1 0 0 0 0  PHQ- 9 Score   3    0    Fall Risk    03/02/2022   12:50 PM 03/02/2022   12:29 PM 12/13/2021   11:11 AM 02/16/2021   11:25 AM 02/10/2020   11:20 AM  [place] in the past year? 0 0 0 0 0  Number falls in past yr: 0  0 0 0  Injury with Fall? 0  0 0 0  Risk for fall due to : No Fall Risks  No Fall Risks Impaired balance/gait;Impaired vision;Impaired mobility Impaired vision  Follow up   Falls evaluation completed Falls prevention discussed     FALL RISK PREVENTION PERTAINING TO THE HOME:  Any stairs in or around the home? No If so, are there any without handrails? No  Home free of loose throw rugs in walkways, pet beds, electrical cords, etc? Yes  Adequate lighting in your home to reduce risk of falls? Yes   ASSISTIVE DEVICES UTILIZED TO PREVENT FALLS:  Life alert? No  Use of a cane, walker or w/c? No  Grab bars in the bathroom? No  Shower chair or bench in shower? No  Elevated toilet seat or a handicapped toilet? Yes   TIMED UP AND GO:  Was the test performed? No . Audio Visit   Cognitive Function:        03/02/2022   12:51 PM 02/16/2021   11:50 AM 02/10/2020   11:21 AM  6CIT Screen  What Year? 0 points 0 points 0 points  What month? 0 points 0 points 0 points  What time? 0 points 0 points   Count back from 20 0 points 0 points 0 points  Months in reverse 0 points 0 points 0 points  Repeat phrase 0 points 0 points 0 points  Total Score 0 points 0 points      Immunizations Immunization History  Administered Date(s) Administered   Influenza,inj,Quad PF,6+ Mos 04/01/2014   Moderna Sars-Covid-2 Vaccination 09/10/2019, 10/14/2019   Pneumococcal Conjugate-13 08/14/2018   Pneumococcal Polysaccharide-23 10/29/2020   Td 02/18/2010   Zoster Recombinat (Shingrix) 01/09/2017, 07/17/2017    TDAP status: Due, Education has been provided regarding the importance of this vaccine. Advised may receive this vaccine at local pharmacy or Health Dept. Aware to provide a copy of the vaccination record if obtained from local pharmacy or Health Dept. Verbalized acceptance and understanding.  Flu Vaccine status: Declined, Education has been provided regarding the importance of this vaccine but patient still declined. Advised may receive this vaccine at local pharmacy or Health Dept. Aware to provide a copy of the vaccination record if obtained from local pharmacy or Health Dept. Verbalized acceptance and understanding.  Pneumococcal vaccine status: Up to date  Covid-19 vaccine status: Completed vaccines  Qualifies for Shingles Vaccine? Yes   Zostavax completed Yes   Shingrix Completed?: Yes  Screening Tests Health Maintenance  Topic Date Due   INFLUENZA VACCINE  02/07/2022   COVID-19 Vaccine (3 -  Moderna risk series) 03/18/2022 (Originally 11/11/2019)   COLONOSCOPY (Pts 45-62yr Insurance coverage will need to be confirmed)  03/23/2022 (Originally 01/13/1998)   TETANUS/TDAP  03/03/2023 (Originally 02/19/2020)   Pneumonia Vaccine 71 Years old  Completed   Hepatitis C Screening  Completed   Zoster Vaccines- Shingrix  Completed   HPV VACCINES  Aged Out    Health Maintenance  Health Maintenance Due  Topic Date Due   INFLUENZA VACCINE  02/07/2022    Colorectal cancer screening: Referral to GI placed Patient scheduled 03/23/22. Pt aware the office will call re: appt.  Lung Cancer Screening: (Low Dose CT Chest recommended if Age 69-80years, 30 pack-year  currently smoking OR have quit w/in 15years.) does qualify.   Lung Cancer Screening Referral: Patient deferred  Additional Screening:  Hepatitis C Screening: does qualify; Completed 01/09/17  Vision Screening: Recommended annual ophthalmology exams for early detection of glaucoma and other disorders of the eye. Is the patient up to date with their annual eye exam?  Yes  Who is the provider or what is the name of the office in which the patient attends annual eye exams? Dr MSabra HeckIf pt is not established with a provider, would they like to be referred to a provider to establish care? No .   Dental Screening: Recommended annual dental exams for proper oral hygiene  Community Resource Referral / Chronic Care Management:  CRR required this visit?  No   CCM required this visit?  No      Plan:     I have personally reviewed and noted the following in the patient's chart:   Medical and social history Use of alcohol, tobacco or illicit drugs  Current medications and supplements including opioid prescriptions. Patient is not currently taking opioid prescriptions. Functional ability and status Nutritional status Physical activity Advanced directives List of other physicians Hospitalizations, surgeries, and ER visits in previous 12 months Vitals Screenings to include cognitive, depression, and falls Referrals and appointments  In addition, I have reviewed and discussed with patient certain preventive protocols, quality metrics, and best practice recommendations. A written personalized care plan for preventive services as well as general preventive health recommendations were provided to patient.     BCriselda Peaches LPN   [id number]  Nurse Notes: None

## 2022-03-02 NOTE — Telephone Encounter (Signed)
Dr.Armbruster,  This patient is for a direct colonoscopy with you on 03/23/2022 for + cologuard. He has right total hip replacement on 02/16/22. He did not want to postpone the procedure he states he has been cleared to drive already per pt, he is going to reach out to his surgeon and get surgical clearance to have the colonoscopy on 03/23/2022. If okay with surgeon is it okay with you to proceed with colonoscopy on 9/14? Please advise. Thank you, Char Feltman pv

## 2022-03-02 NOTE — Progress Notes (Signed)
Patient's pre-visit was done today over the phone with the patient. Name,DOB and address verified. Patient denies any allergies to Eggs and Soy. Patient denies any problems with anesthesia/sedation. Patient is not taking any diet pills or blood thinners. No home Oxygen. Insurance confirmed with patient. S/p right hip replacement 02/16/2022.  Went over prep instructions with patient. Prep instructions sent to pt's MyChart & mailed to pt-pt is aware. Patient understands to call us back with any questions or concerns. Patient is aware of our care-partner policy. Patient encouraged to use Good-Rx for prep prescription.

## 2022-03-02 NOTE — Telephone Encounter (Signed)
Patient notified of Dr.Armbruster's recommendations.

## 2022-03-02 NOTE — Telephone Encounter (Signed)
Fine, already signed

## 2022-03-02 NOTE — Telephone Encounter (Signed)
If he can bear weight and ambulate, I don't see why he would need to postpone or have clearance. He will be lying on his left side for the colonoscopy. I think okay to proceed as long as he is recovering well and ambulatory. Thanks

## 2022-03-14 DIAGNOSIS — M542 Cervicalgia: Secondary | ICD-10-CM | POA: Diagnosis not present

## 2022-03-23 ENCOUNTER — Ambulatory Visit (AMBULATORY_SURGERY_CENTER): Payer: Medicare Other | Admitting: Gastroenterology

## 2022-03-23 ENCOUNTER — Encounter: Payer: Self-pay | Admitting: Gastroenterology

## 2022-03-23 VITALS — BP 112/50 | HR 50 | Temp 96.2°F | Resp 21 | Ht 69.0 in | Wt 235.0 lb

## 2022-03-23 DIAGNOSIS — D120 Benign neoplasm of cecum: Secondary | ICD-10-CM | POA: Diagnosis not present

## 2022-03-23 DIAGNOSIS — D123 Benign neoplasm of transverse colon: Secondary | ICD-10-CM | POA: Diagnosis not present

## 2022-03-23 DIAGNOSIS — D122 Benign neoplasm of ascending colon: Secondary | ICD-10-CM

## 2022-03-23 DIAGNOSIS — R195 Other fecal abnormalities: Secondary | ICD-10-CM | POA: Diagnosis not present

## 2022-03-23 DIAGNOSIS — Z1211 Encounter for screening for malignant neoplasm of colon: Secondary | ICD-10-CM

## 2022-03-23 DIAGNOSIS — K6289 Other specified diseases of anus and rectum: Secondary | ICD-10-CM | POA: Diagnosis not present

## 2022-03-23 DIAGNOSIS — K635 Polyp of colon: Secondary | ICD-10-CM

## 2022-03-23 MED ORDER — SODIUM CHLORIDE 0.9 % IV SOLN: 500.0000 mL | Freq: Once | INTRAVENOUS | Status: DC

## 2022-03-23 NOTE — Progress Notes (Signed)
To pacu, VSS. Report to Rn.tb 

## 2022-03-23 NOTE — Op Note (Signed)
[facility] Patient Name: Samuel [name] Procedure Date: 03/23/2022 3:03 PM MRN: [id number] Endoscopist: Remo Lipps P. Havery Moros , MD Age: 71 Referring MD:  Date of Birth: Sep 21, 1950 Gender: Male Account #: [id number] Procedure:                Colonoscopy Indications:              This is the patient's first colonoscopy, Positive                            Cologuard test Medicines:                Monitored Anesthesia Care Procedure:                Pre-Anesthesia Assessment:                           - Prior to the procedure, a History and Physical                            was performed, and patient medications and                            allergies were reviewed. The patient's tolerance of                            previous anesthesia was also reviewed. The risks                            and benefits of the procedure and the sedation                            options and risks were discussed with the patient.                            All questions were answered, and informed consent                            was obtained. Prior Anticoagulants: The patient has                            taken no previous anticoagulant or antiplatelet                            agents. ASA Grade Assessment: II - A patient with                            mild systemic disease. After reviewing the risks                            and benefits, the patient was deemed in                            satisfactory condition to undergo the procedure.  After obtaining informed consent, the colonoscope                            was passed under direct vision. Throughout the                            procedure, the patient's blood pressure, pulse, and                            oxygen saturations were monitored continuously. The                            Olympus CF-HQ190L [id number]) Colonoscope was                            introduced through the anus and advanced to the  the                            cecum, identified by appendiceal orifice and                            ileocecal valve. The colonoscopy was technically                            difficult and complex due to restricted mobility of                            the colon. The patient tolerated the procedure                            well. The quality of the bowel preparation was                            adequate. The ileocecal valve, appendiceal orifice,                            and rectum were photographed. Scope In: 3:18:15 PM Scope Out: 3:50:42 PM Scope Withdrawal Time: 0 hours 18 minutes 43 seconds  Total Procedure Duration: 0 hours 32 minutes 27 seconds  Findings:                 The perianal and digital rectal examinations were                            normal.                           A diminutive polyp was found in the cecum. The                            polyp was sessile. The polyp was removed with a                            cold snare. Resection and retrieval were complete.  A diminutive polyp was found in the ascending                            colon. The polyp was sessile. The polyp was removed                            with a cold snare. Resection and retrieval were                            complete.                           A 3 mm polyp was found in the hepatic flexure. The                            polyp was sessile. The polyp was removed with a                            cold snare. Resection and retrieval were complete.                           Two sessile polyps were found in the transverse                            colon. The polyps were 3 to 5 mm in size. These                            polyps were removed with a cold snare. Resection                            and retrieval were complete.                           Many medium-mouthed diverticula were found in the                            transverse colon and left colon.  Severe in the left                            colon with luminal narrowing - this made for                            prolonged cecal intubation.                           Anal papilla(e) were hypertrophied. Biopsies were                            taken with a cold forceps for histology to rule out                            AIN.  Internal hemorrhoids were found during                            retroflexion. The hemorrhoids were small.                           The exam was otherwise without abnormality. Complications:            No immediate complications. Estimated blood loss:                            Minimal. Estimated Blood Loss:     Estimated blood loss was minimal. Impression:               - One diminutive polyp in the cecum, removed with a                            cold snare. Resected and retrieved.                           - One diminutive polyp in the ascending colon,                            removed with a cold snare. Resected and retrieved.                           - One 3 mm polyp at the hepatic flexure, removed                            with a cold snare. Resected and retrieved.                           - Two 3 to 5 mm polyps in the transverse colon,                            removed with a cold snare. Resected and retrieved.                           - Diverticulosis in the transverse colon and in the                            left colon.                           - Anal papilla(e) were hypertrophied. Biopsied.                           - Internal hemorrhoids.                           - The examination was otherwise normal. Recommendation:           - Patient has a contact number available for                            emergencies. The  signs and symptoms of potential                            delayed complications were discussed with the                            patient. Return to normal activities tomorrow.                             Written discharge instructions were provided to the                            patient.                           - Resume previous diet.                           - Continue present medications.                           - Await pathology results. Remo Lipps P. Kelcie Currie, MD 03/23/2022 3:57:22 PM This report has been signed electronically.

## 2022-03-23 NOTE — Patient Instructions (Signed)
Await pathology results.  Handout on polyps and diverticulosis provided.  YOU HAD AN ENDOSCOPIC PROCEDURE TODAY AT THE  ENDOSCOPY CENTER:   Refer to the procedure report that was given to you for any specific questions about what was found during the examination.  If the procedure report does not answer your questions, please call your gastroenterologist to clarify.  If you requested that your care partner not be given the details of your procedure findings, then the procedure report has been included in a sealed envelope for you to review at your convenience later.  YOU SHOULD EXPECT: Some feelings of bloating in the abdomen. Passage of more gas than usual.  Walking can help get rid of the air that was put into your GI tract during the procedure and reduce the bloating. If you had a lower endoscopy (such as a colonoscopy or flexible sigmoidoscopy) you may notice spotting of blood in your stool or on the toilet paper. If you underwent a bowel prep for your procedure, you may not have a normal bowel movement for a few days.  Please Note:  You might notice some irritation and congestion in your nose or some drainage.  This is from the oxygen used during your procedure.  There is no need for concern and it should clear up in a day or so.  SYMPTOMS TO REPORT IMMEDIATELY:  Following lower endoscopy (colonoscopy or flexible sigmoidoscopy):  Excessive amounts of blood in the stool  Significant tenderness or worsening of abdominal pains  Swelling of the abdomen that is new, acute  Fever of 100F or higher   For urgent or emergent issues, a gastroenterologist can be reached at any hour by calling [phone]. Do not use MyChart messaging for urgent concerns.    DIET:  We do recommend a small meal at first, but then you may proceed to your regular diet.  Drink plenty of fluids but you should avoid alcoholic beverages for 24 hours.  ACTIVITY:  You should plan to take it easy for the rest of  today and you should NOT DRIVE or use heavy machinery until tomorrow (because of the sedation medicines used during the test).    FOLLOW UP: Our staff will call the number listed on your records the next business day following your procedure.  We will call around 7:15- 8:00 am to check on you and address any questions or concerns that you may have regarding the information given to you following your procedure. If we do not reach you, we will leave a message.     If any biopsies were taken you will be contacted by phone or by letter within the next 1-3 weeks.  Please call us at [phone] if you have not heard about the biopsies in 3 weeks.    SIGNATURES/CONFIDENTIALITY: You and/or your care partner have signed paperwork which will be entered into your electronic medical record.  These signatures attest to the fact that that the information above on your After Visit Summary has been reviewed and is understood.  Full responsibility of the confidentiality of this discharge information lies with you and/or your care-partner.  

## 2022-03-23 NOTE — Progress Notes (Signed)
[facility] History and Physical   Primary Care Physician:  Eulas Post, MD   Reason for Procedure:   Positive cologuard  Plan:    colonoscopy     HPI: Samuel [name] is a 71 y.o. male  here for (+) cologuard - first time, no prior colonoscopy. Patient denies any bowel symptoms at this time. No family history of colon cancer known. Otherwise feels well without any cardiopulmonary symptoms.   I have discussed risks / benefits of anesthesia and endoscopic procedure with Leanor Kail Fulcher and they wish to proceed with the exams as outlined today.    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   severe- right anterior shoulder- (EXC)   Atypical nevus 07/18/2011   mild-mod-right upper abdomen   Back pain    Basal cell carcinoma 07/17/2019   nod-left forearm-(CX35FU)   COVID 01/07/2021   EPISTAXIS, RECURRENT 12/21/2009   ESSENTIAL HYPERTENSION 12/21/2009   Hypertension    Joint pain    Other fatigue    Prediabetes    Retinal detachment    OD   Shortness of breath on exertion    VENTRICULAR HYPERTROPHY, LEFT 01/04/2010    Past Surgical History:  Procedure Laterality Date   CATARACT EXTRACTION Bilateral 2009   EYE SURGERY Bilateral 2009   cataract   GAS/FLUID EXCHANGE Right 09/26/2018   Procedure: Gas/Fluid Exchange;  Surgeon: Bernarda Caffey, MD;  Location: [facility];  Service: Ophthalmology;  Laterality: Right;   PHOTOCOAGULATION WITH LASER Right 09/26/2018   Procedure: Photocoagulation With Laser;  Surgeon: Bernarda Caffey, MD;  Location: [facility];  Service: Ophthalmology;  Laterality: Right;   SCLERAL BUCKLE Right 09/26/2018   Procedure: Scleral Buckle;  Surgeon: Bernarda Caffey, MD;  Location: [facility];  Service: Ophthalmology;  Laterality: Right;   TOTAL HIP ARTHROPLASTY Right 02/16/2022   VITRECTOMY 25 GAUGE WITH SCLERAL BUCKLE Right 09/26/2018   Procedure: VITRECTOMY 25 GAUGE;   Surgeon: Bernarda Caffey, MD;  Location: [facility];  Service: Ophthalmology;  Laterality: Right;    Prior to Admission medications   Medication Sig Start Date End Date Taking? Authorizing Provider  allopurinol (ZYLOPRIM) 100 MG tablet Take 2 tablets by mouth daily 12/14/21  Yes Burchette, Alinda Sierras, MD  aspirin EC 81 MG tablet Take 81 mg by mouth daily. Swallow whole.   Yes [provider]  atorvastatin (LIPITOR) 20 MG tablet TAKE ONE TABLET BY MOUTH DAILY 12/06/21  Yes Burchette, Alinda Sierras, MD  colchicine 0.6 MG tablet Take 1 tablet (0.6 mg total) by mouth daily. 03/01/22  Yes Regal, Tamala Fothergill, DPM  dorzolamide-timolol (COSOPT) 22.3-6.8 MG/ML ophthalmic solution INSTILL ONE DROP IN [facility] EYE DAILY 10/19/21  Yes Bernarda Caffey, MD  famotidine (PEPCID) 20 MG tablet Take 20 mg by mouth daily.   Yes [provider]  glucosamine-chondroitin 500-400 MG tablet Take 1 tablet by mouth daily.   Yes [provider]  hydrochlorothiazide (HYDRODIURIL) 25 MG tablet TAKE 1 TABLET(25 MG) BY MOUTH DAILY 01/09/22  Yes Burchette, Alinda Sierras, MD  lisinopril (ZESTRIL) 20 MG tablet TAKE 1 TABLET(20 MG) BY MOUTH DAILY 12/23/21  Yes Burchette, Alinda Sierras, MD  metoprolol tartrate (LOPRESSOR) 50 MG tablet TAKE 1 TABLET(50 MG) BY MOUTH DAILY 12/28/21  Yes Burchette, Alinda Sierras, MD  Multiple Vitamin (MULTIVITAMIN) tablet Take 1 tablet by mouth daily.   Yes [provider]  silodosin (RAPAFLO) 8 MG CAPS capsule silodosin 8  mg capsule 11/08/21  Yes [provider]  UNABLE TO FIND Take 1 tablet by mouth daily. Med Name: Relief Factor.   Yes [provider]  Vibegron (GEMTESA) 75 MG TABS 1 tablet every day by oral route. 02/10/22  Yes [provider]  alclomethasone (ACLOVATE) 0.05 % cream Apply topically 2 (two) times daily as needed (Rash). 09/22/20   Sheffield, Vida Roller R, PA-C  clobetasol (OLUX) 0.05 % topical foam APPLY TO AFFECTED AREA(S) TWO TIMES A DAY 10/18/21   Sheffield, Vida Roller R, PA-C   ketoconazole (NIZORAL) 2 % cream APPLY 1 APPLICATION TOPICALLY IN THE MORNING AND AT BEDTIME Patient not taking: Reported on 03/23/2022 01/11/22   Warren Danes, PA-C  sildenafil (VIAGRA) 100 MG tablet TAKE 1/2 TO 1 TABLET BY MOUTH DAILY AS NEEDED FOR FOR ERECTILE DYSFUNCTION **THIS IS NOT A DAILY MEDICATION 01/23/22   Eulas Post, MD    Current Outpatient Medications  Medication Sig Dispense Refill   allopurinol (ZYLOPRIM) 100 MG tablet Take 2 tablets by mouth daily 180 tablet 0   aspirin EC 81 MG tablet Take 81 mg by mouth daily. Swallow whole.     atorvastatin (LIPITOR) 20 MG tablet TAKE ONE TABLET BY MOUTH DAILY 90 tablet 1   colchicine 0.6 MG tablet Take 1 tablet (0.6 mg total) by mouth daily. 20 tablet 1   dorzolamide-timolol (COSOPT) 22.3-6.8 MG/ML ophthalmic solution INSTILL ONE DROP IN EACH EYE DAILY 10 mL 9   famotidine (PEPCID) 20 MG tablet Take 20 mg by mouth daily.     glucosamine-chondroitin 500-400 MG tablet Take 1 tablet by mouth daily.     hydrochlorothiazide (HYDRODIURIL) 25 MG tablet TAKE 1 TABLET(25 MG) BY MOUTH DAILY 90 tablet 2   lisinopril (ZESTRIL) 20 MG tablet TAKE 1 TABLET(20 MG) BY MOUTH DAILY 90 tablet 3   metoprolol tartrate (LOPRESSOR) 50 MG tablet TAKE 1 TABLET(50 MG) BY MOUTH DAILY 90 tablet 1   Multiple Vitamin (MULTIVITAMIN) tablet Take 1 tablet by mouth daily.     silodosin (RAPAFLO) 8 MG CAPS capsule silodosin 8 mg capsule     UNABLE TO FIND Take 1 tablet by mouth daily. Med Name: Relief Factor.     Vibegron (GEMTESA) 75 MG TABS 1 tablet every day by oral route.     alclomethasone (ACLOVATE) 0.05 % cream Apply topically 2 (two) times daily as needed (Rash). 180 g 3   clobetasol (OLUX) 0.05 % topical foam APPLY TO AFFECTED AREA(S) TWO TIMES A DAY 50 g 4   ketoconazole (NIZORAL) 2 % cream APPLY 1 APPLICATION TOPICALLY IN THE MORNING AND AT BEDTIME (Patient not taking: Reported on 03/23/2022) 60 g 1   sildenafil (VIAGRA) 100 MG tablet TAKE 1/2 TO 1  TABLET BY MOUTH DAILY AS NEEDED FOR FOR ERECTILE DYSFUNCTION **THIS IS NOT A DAILY MEDICATION 10 tablet 2   Current Facility-Administered Medications  Medication Dose Route Frequency Provider Last Rate Last Admin   0.9 %  sodium chloride infusion  500 mL Intravenous Once Trace Cederberg, Carlota Raspberry, MD        Allergies as of 03/23/2022 - Review Complete 03/23/2022  Allergen Reaction Noted   Penicillins  12/21/2009    Family History  Problem Relation Age of Onset   Cancer Mother        bladder CA   Hypertension Mother    Hypertension Father    Heart disease Father    Heart disease Maternal Grandfather    Hypertension Maternal Grandfather    Cancer Paternal  Grandmother        ovarian CA   Heart disease Paternal Grandfather    Hypertension Paternal Grandfather    Colon cancer Neg Hx     Social History   Socioeconomic History   Marital status: Legally Separated    Spouse name: Not on file   Number of children: Not on file   Years of education: Not on file   Highest education level: Not on file  Occupational History   Occupation: Insuarnce agent  Tobacco Use   Smoking status: Some Days    Packs/day: 1.00    Years: 18.00    Total pack years: 18.00    Types: Cigars, Cigarettes    Last attempt to quit: 08/22/1988    Years since quitting: 33.6   Smokeless tobacco: Never  Vaping Use   Vaping Use: Never used  Substance and Sexual Activity   Alcohol use: Yes    Alcohol/week: 14.0 standard drinks of alcohol    Types: 14 Standard drinks or equivalent per week    Comment: social   Drug use: Never   Sexual activity: Not Currently    Partners: Female  Other Topics Concern   Not on file  Social History Narrative   Not on file   Social Determinants of Health   Financial Resource Strain: Low Risk  (03/02/2022)   Overall Financial Resource Strain (CARDIA)    Difficulty of Paying Living Expenses: Not hard at all  Food Insecurity: No Food Insecurity (03/02/2022)   Hunger Vital Sign     Worried About Running Out of Food in the Last Year: Never true    [place] in the Last Year: Never true  Transportation Needs: No Transportation Needs (03/02/2022)   PRAPARE - Hydrologist (Medical): No    Lack of Transportation (Non-Medical): No  Physical Activity: Inactive (03/02/2022)   Exercise Vital Sign    Days of Exercise per Week: 0 days    Minutes of Exercise per Session: 0 min  Stress: No Stress Concern Present (03/02/2022)   [place]    Feeling of Stress : Not at all  Social Connections: Moderately Integrated (03/02/2022)   Social Connection and Isolation Panel [NHANES]    Frequency of Communication with Friends and Family: More than three times a week    Frequency of Social Gatherings with Friends and Family: More than three times a week    Attends Religious Services: More than 4 times per year    Active Member of [employer] or Organizations: Yes    Attends Archivist Meetings: More than 4 times per year    Marital Status: Separated  Intimate Partner Violence: Not At Risk (03/02/2022)   Humiliation, Afraid, Rape, and Kick questionnaire    Fear of Current or Ex-Partner: No    Emotionally Abused: No    Physically Abused: No    Sexually Abused: No    Review of Systems: All other review of systems negative except as mentioned in the HPI.  Physical Exam: Vital signs BP (!) 112/53   Pulse (!) 52   Temp (!) 96.2 F (35.7 C)   Ht '5\' 9"'$  (1.753 m)   Wt 235 lb (106.6 kg)   SpO2 96%   BMI 34.70 kg/m   General:   Alert,  Well-developed, pleasant and cooperative in NAD Lungs:  Clear throughout to auscultation.   Heart:  Regular rate and rhythm Abdomen:  Soft,  nontender and nondistended.   Neuro/Psych:  Alert and cooperative. Normal mood and affect. A and O x 3  Jolly Mango, MD [facility]

## 2022-03-23 NOTE — Progress Notes (Signed)
Called to room to assist during endoscopic procedure.  Patient ID and intended procedure confirmed with present staff. Received instructions for my participation in the procedure from the performing physician.  

## 2022-03-24 ENCOUNTER — Telehealth: Payer: Self-pay

## 2022-03-24 NOTE — Telephone Encounter (Signed)
No answer, left message to call if having any issues or concerns, B.Deveon Kisiel RN 

## 2022-03-26 ENCOUNTER — Other Ambulatory Visit: Payer: Self-pay | Admitting: Family Medicine

## 2022-03-30 DIAGNOSIS — R3915 Urgency of urination: Secondary | ICD-10-CM | POA: Diagnosis not present

## 2022-03-30 DIAGNOSIS — N401 Enlarged prostate with lower urinary tract symptoms: Secondary | ICD-10-CM | POA: Diagnosis not present

## 2022-03-30 DIAGNOSIS — N5201 Erectile dysfunction due to arterial insufficiency: Secondary | ICD-10-CM | POA: Diagnosis not present

## 2022-04-06 DIAGNOSIS — Z5189 Encounter for other specified aftercare: Secondary | ICD-10-CM | POA: Diagnosis not present

## 2022-04-21 ENCOUNTER — Other Ambulatory Visit: Payer: Self-pay | Admitting: Podiatry

## 2022-04-28 ENCOUNTER — Encounter: Payer: Self-pay | Admitting: Podiatry

## 2022-05-01 ENCOUNTER — Other Ambulatory Visit: Payer: Self-pay | Admitting: Podiatry

## 2022-05-01 MED ORDER — COLCHICINE 0.6 MG PO TABS: 0.6000 mg | ORAL_TABLET | Freq: Two times a day (BID) | ORAL | 1 refills | Status: DC

## 2022-05-18 ENCOUNTER — Telehealth: Payer: Self-pay | Admitting: Family Medicine

## 2022-05-18 NOTE — Telephone Encounter (Signed)
Pt is having gout flare up  trouble walking and took some colchicine last night around 11 pm . Pt took 2 colchicine 6  mg and would like to  know if he can take some more. Pt spoke with triage nurse and was told to call the office. Pt is aware md out of office and would like another provider to ask the message

## 2022-05-18 NOTE — Telephone Encounter (Signed)
Patient informed of the message below.

## 2022-05-25 ENCOUNTER — Other Ambulatory Visit: Payer: Self-pay | Admitting: Family Medicine

## 2022-05-28 ENCOUNTER — Other Ambulatory Visit: Payer: Self-pay | Admitting: Family Medicine

## 2022-05-28 DIAGNOSIS — E7849 Other hyperlipidemia: Secondary | ICD-10-CM

## 2022-06-19 ENCOUNTER — Other Ambulatory Visit: Payer: Self-pay | Admitting: Podiatry

## 2022-06-21 ENCOUNTER — Encounter: Payer: Self-pay | Admitting: Family Medicine

## 2022-06-22 MED ORDER — METOPROLOL TARTRATE 50 MG PO TABS: ORAL_TABLET | ORAL | 1 refills | Status: DC

## 2022-06-23 ENCOUNTER — Other Ambulatory Visit: Payer: Self-pay | Admitting: Family Medicine

## 2022-07-19 ENCOUNTER — Ambulatory Visit: Payer: Medicare Other | Admitting: Physician Assistant

## 2022-08-23 ENCOUNTER — Telehealth: Payer: Self-pay | Admitting: Family Medicine

## 2022-08-23 ENCOUNTER — Encounter: Payer: Self-pay | Admitting: Family Medicine

## 2022-08-23 ENCOUNTER — Telehealth (INDEPENDENT_AMBULATORY_CARE_PROVIDER_SITE_OTHER): Payer: Medicare Other | Admitting: Family Medicine

## 2022-08-23 VITALS — Ht 69.0 in | Wt 235.0 lb

## 2022-08-23 DIAGNOSIS — R6889 Other general symptoms and signs: Secondary | ICD-10-CM

## 2022-08-23 MED ORDER — OSELTAMIVIR PHOSPHATE 75 MG PO CAPS: 75.0000 mg | ORAL_CAPSULE | Freq: Two times a day (BID) | ORAL | 0 refills | Status: DC

## 2022-08-23 NOTE — Telephone Encounter (Signed)
Patient has been scheduled for today.

## 2022-08-23 NOTE — Telephone Encounter (Signed)
Patient called to check to make sure correct pharmacy was listed. Confirmed with him

## 2022-08-23 NOTE — Progress Notes (Signed)
Patient ID: Samuel [name], male   DOB: 1951-02-11, 72 y.o.   MRN: [id number]   Virtual Visit via Video Note  I connected with Roberto Scales on 08/23/22 at  5:15 PM EST by a video enabled telemedicine application and verified that I am speaking with the correct person using two identifiers.  Location patient: home Location provider:work or home office Persons participating in the virtual visit: patient, provider  I discussed the limitations of evaluation and management by telemedicine and the availability of in person appointments. The patient expressed understanding and agreed to proceed.   HPI:  Samuel [name] states he had onset yesterday of some mild nasal congestion and chest congestion.  Symptoms worsened today with low-grade fever and some bodyaches and somewhat worsening cough.  Increased malaise.  Was around her granddaughter who is ill over the weekend.  He thinks he has flu.  He has home COVID test but has not tested yet.  He also has a son who just tested positive for flu but he was not around him the past few days.  Denies any dyspnea.  Denies any nausea, vomiting, or diarrhea   ROS: See pertinent positives and negatives per HPI.  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   severe- right anterior shoulder- (EXC)   Atypical nevus 07/18/2011   mild-mod-right upper abdomen   Back pain    Basal cell carcinoma 07/17/2019   nod-left forearm-([id number])   COVID 01/07/2021   EPISTAXIS, RECURRENT 12/21/2009   ESSENTIAL HYPERTENSION 12/21/2009   Hypertension    Joint pain    Other fatigue    Prediabetes    Retinal detachment    OD   Shortness of breath on exertion    VENTRICULAR HYPERTROPHY, LEFT 01/04/2010    Past Surgical History:  Procedure Laterality Date   CATARACT EXTRACTION Bilateral 2009   EYE SURGERY Bilateral 2009   cataract   GAS/FLUID EXCHANGE Right 09/26/2018   Procedure: Gas/Fluid  Exchange;  Surgeon: Bernarda Caffey, MD;  Location: [facility];  Service: Ophthalmology;  Laterality: Right;   PHOTOCOAGULATION WITH LASER Right 09/26/2018   Procedure: Photocoagulation With Laser;  Surgeon: Bernarda Caffey, MD;  Location: [facility];  Service: Ophthalmology;  Laterality: Right;   SCLERAL BUCKLE Right 09/26/2018   Procedure: Scleral Buckle;  Surgeon: Bernarda Caffey, MD;  Location: [facility];  Service: Ophthalmology;  Laterality: Right;   TOTAL HIP ARTHROPLASTY Right 02/16/2022   VITRECTOMY 25 GAUGE WITH SCLERAL BUCKLE Right 09/26/2018   Procedure: VITRECTOMY 25 GAUGE;  Surgeon: Bernarda Caffey, MD;  Location: [facility];  Service: Ophthalmology;  Laterality: Right;    Family History  Problem Relation Age of Onset   Cancer Mother        bladder CA   Hypertension Mother    Hypertension Father    Heart disease Father    Heart disease Maternal Grandfather    Hypertension Maternal Grandfather    Cancer Paternal Grandmother        ovarian CA   Heart disease Paternal Grandfather    Hypertension Paternal Grandfather    Colon cancer Neg Hx     SOCIAL HX: Non-smoker   Current Outpatient Medications:    alclomethasone (ACLOVATE) 0.05 % cream, Apply topically 2 (two) times daily as needed (Rash)., Disp: 180 g, Rfl: 3   allopurinol (ZYLOPRIM) 100 MG tablet, TAKE 2 TABLETS BY MOUTH DAILY, Disp: 180 tablet, Rfl: 0   aspirin  EC 81 MG tablet, Take 81 mg by mouth daily. Swallow whole., Disp: , Rfl:    atorvastatin (LIPITOR) 20 MG tablet, TAKE 1 TABLET BY MOUTH DAILY, Disp: 90 tablet, Rfl: 1   clobetasol (OLUX) 0.05 % topical foam, APPLY TO AFFECTED AREA(S) TWO TIMES A DAY, Disp: 50 g, Rfl: 4   colchicine 0.6 MG tablet, Take 1 tablet (0.6 mg total) by mouth daily., Disp: 20 tablet, Rfl: 1   colchicine 0.6 MG tablet, TAKE 1 TABLET BY MOUTH TWICE A DAY, Disp: 15 tablet, Rfl: 1   dorzolamide-timolol (COSOPT) 22.3-6.8 MG/ML ophthalmic solution, INSTILL ONE DROP IN EACH EYE DAILY, Disp: 10 mL, Rfl: 9    famotidine (PEPCID) 20 MG tablet, Take 20 mg by mouth daily., Disp: , Rfl:    glucosamine-chondroitin 500-400 MG tablet, Take 1 tablet by mouth daily., Disp: , Rfl:    hydrochlorothiazide (HYDRODIURIL) 25 MG tablet, TAKE 1 TABLET(25 MG) BY MOUTH DAILY, Disp: 90 tablet, Rfl: 2   ketoconazole (NIZORAL) 2 % cream, APPLY 1 APPLICATION TOPICALLY IN THE MORNING AND AT BEDTIME, Disp: 60 g, Rfl: 1   lisinopril (ZESTRIL) 20 MG tablet, TAKE 1 TABLET(20 MG) BY MOUTH DAILY, Disp: 90 tablet, Rfl: 3   metoprolol tartrate (LOPRESSOR) 50 MG tablet, TAKE 1 TABLET(50 MG) BY MOUTH DAILY, Disp: 90 tablet, Rfl: 1   Multiple Vitamin (MULTIVITAMIN) tablet, Take 1 tablet by mouth daily., Disp: , Rfl:    oseltamivir (TAMIFLU) 75 MG capsule, Take 1 capsule (75 mg total) by mouth 2 (two) times daily., Disp: 10 capsule, Rfl: 0   sildenafil (VIAGRA) 100 MG tablet, TAKE 1/2 TO 1 TABLET BY MOUTH DAILY AS NEEDED FOR ERECTILE DYSFUNCTION **NOT A DAILY MEDICATION, Disp: 10 tablet, Rfl: 2   silodosin (RAPAFLO) 8 MG CAPS capsule, silodosin 8 mg capsule, Disp: , Rfl:    UNABLE TO FIND, Take 1 tablet by mouth daily. Med Name: Relief Factor., Disp: , Rfl:    Vibegron (GEMTESA) 75 MG TABS, 1 tablet every day by oral route., Disp: , Rfl:   EXAM:  VITALS per patient if applicable:  GENERAL: alert, oriented, appears well and in no acute distress  HEENT: atraumatic, conjunttiva clear, no obvious abnormalities on inspection of external nose and ears  NECK: normal movements of the head and neck  LUNGS: on inspection no signs of respiratory distress, breathing rate appears normal, no obvious gross SOB, gasping or wheezing  CV: no obvious cyanosis  MS: moves all visible extremities without noticeable abnormality  PSYCH/NEURO: pleasant and cooperative, no obvious depression or anxiety, speech and thought processing grossly intact  ASSESSMENT AND PLAN:  Discussed the following assessment and plan:  Flulike symptoms.  He does have  some home COVID tests and we recommend he consider testing.  If negative, would consider going ahead with empiric treatment with Tamiflu 75 mg twice daily for 5 days -Plenty fluids and rest -Follow-up for any persistent or worsening symptoms.     I discussed the assessment and treatment plan with the patient. The patient was provided an opportunity to ask questions and all were answered. The patient agreed with the plan and demonstrated an understanding of the instructions.   The patient was advised to call back or seek an in-person evaluation if the symptoms worsen or if the condition fails to improve as anticipated.     Carolann Littler, MD

## 2022-08-23 NOTE — Telephone Encounter (Signed)
Pt want dr.Burchette to call him in some [facility]raflu [facility]  [id number] [place], [place]: [phone] Fax: [phone]   Offer pt a appt but he refuse.

## 2022-09-13 ENCOUNTER — Other Ambulatory Visit: Payer: Self-pay | Admitting: Family Medicine

## 2022-09-14 NOTE — Telephone Encounter (Signed)
error 

## 2022-09-19 ENCOUNTER — Encounter: Payer: Self-pay | Admitting: Family Medicine

## 2022-09-20 MED ORDER — INDOMETHACIN 25 MG PO CAPS: ORAL_CAPSULE | ORAL | 0 refills | Status: DC

## 2022-09-20 NOTE — Telephone Encounter (Signed)
Noted  

## 2022-09-23 ENCOUNTER — Other Ambulatory Visit: Payer: Self-pay | Admitting: Family Medicine

## 2022-09-27 ENCOUNTER — Other Ambulatory Visit: Payer: Self-pay | Admitting: Family Medicine

## 2022-10-01 ENCOUNTER — Encounter: Payer: Self-pay | Admitting: Family Medicine

## 2022-10-02 MED ORDER — HYDROCHLOROTHIAZIDE 25 MG PO TABS: ORAL_TABLET | ORAL | 0 refills | Status: DC

## 2022-10-20 ENCOUNTER — Other Ambulatory Visit: Payer: Self-pay | Admitting: Podiatry

## 2022-11-06 ENCOUNTER — Ambulatory Visit (INDEPENDENT_AMBULATORY_CARE_PROVIDER_SITE_OTHER): Payer: Medicare Other | Admitting: Podiatry

## 2022-11-06 DIAGNOSIS — M2042 Other hammer toe(s) (acquired), left foot: Secondary | ICD-10-CM | POA: Diagnosis not present

## 2022-11-06 DIAGNOSIS — Q828 Other specified congenital malformations of skin: Secondary | ICD-10-CM | POA: Diagnosis not present

## 2022-11-06 DIAGNOSIS — M1A0710 Idiopathic chronic gout, right ankle and foot, without tophus (tophi): Secondary | ICD-10-CM | POA: Diagnosis not present

## 2022-11-06 DIAGNOSIS — M2041 Other hammer toe(s) (acquired), right foot: Secondary | ICD-10-CM | POA: Diagnosis not present

## 2022-11-07 NOTE — Progress Notes (Signed)
Subjective:   Patient ID: Samuel [name], male   DOB: 72 y.o.   MRN: [id number]   HPI Patient presents with bunion deformity digital deformities he is concerned about and also calluses that at times can be aggravating.  States that he does wear different types of shoes which seems to help him but is concerned about progression    ROS      Objective:  Physical Exam  Neurovascular status intact muscle strength was found to be adequate range of motion adequate patient found to have structural deformity right over left foot deviation big toe against the second toe moderate elevation of the lesser digits     Assessment:  Structural deformity that not progressing significantly with currently minimal physical symptoms with lesion formation left localized     Plan:  H&P reviewed both conditions do not currently recommend surgery as I do not think this will reach a point that it will become symptomatic to that point and surgery could be difficult from a recovery perspective.  Courtesy debridement of lesions education rendered reappoint as needed

## 2022-11-16 DIAGNOSIS — R3915 Urgency of urination: Secondary | ICD-10-CM | POA: Diagnosis not present

## 2022-11-16 DIAGNOSIS — N401 Enlarged prostate with lower urinary tract symptoms: Secondary | ICD-10-CM | POA: Diagnosis not present

## 2022-11-19 ENCOUNTER — Other Ambulatory Visit: Payer: Self-pay | Admitting: Family Medicine

## 2022-11-19 ENCOUNTER — Other Ambulatory Visit (INDEPENDENT_AMBULATORY_CARE_PROVIDER_SITE_OTHER): Payer: Self-pay | Admitting: Ophthalmology

## 2022-11-26 ENCOUNTER — Other Ambulatory Visit: Payer: Self-pay | Admitting: Family Medicine

## 2022-11-26 DIAGNOSIS — E7849 Other hyperlipidemia: Secondary | ICD-10-CM

## 2022-11-30 ENCOUNTER — Other Ambulatory Visit: Payer: Self-pay | Admitting: Family Medicine

## 2022-12-01 MED ORDER — INDOMETHACIN 25 MG PO CAPS: ORAL_CAPSULE | ORAL | 0 refills | Status: DC

## 2022-12-15 ENCOUNTER — Encounter: Payer: Self-pay | Admitting: Family Medicine

## 2022-12-15 MED ORDER — METOPROLOL TARTRATE 50 MG PO TABS: ORAL_TABLET | ORAL | 0 refills | Status: DC

## 2022-12-18 ENCOUNTER — Encounter: Payer: Self-pay | Admitting: Family Medicine

## 2022-12-19 ENCOUNTER — Emergency Department (HOSPITAL_BASED_OUTPATIENT_CLINIC_OR_DEPARTMENT_OTHER): Payer: Medicare Other | Admitting: Radiology

## 2022-12-19 ENCOUNTER — Emergency Department (HOSPITAL_BASED_OUTPATIENT_CLINIC_OR_DEPARTMENT_OTHER)
Admission: EM | Admit: 2022-12-19 | Discharge: 2022-12-19 | Disposition: A | Discharge status: Home / Self Care | Payer: Medicare Other | Attending: Emergency Medicine | Admitting: Emergency Medicine

## 2022-12-19 ENCOUNTER — Encounter (HOSPITAL_BASED_OUTPATIENT_CLINIC_OR_DEPARTMENT_OTHER): Payer: Self-pay

## 2022-12-19 ENCOUNTER — Other Ambulatory Visit: Payer: Self-pay

## 2022-12-19 ENCOUNTER — Emergency Department (HOSPITAL_BASED_OUTPATIENT_CLINIC_OR_DEPARTMENT_OTHER): Payer: Medicare Other

## 2022-12-19 DIAGNOSIS — Z79899 Other long term (current) drug therapy: Secondary | ICD-10-CM | POA: Insufficient documentation

## 2022-12-19 DIAGNOSIS — N3001 Acute cystitis with hematuria: Secondary | ICD-10-CM | POA: Diagnosis not present

## 2022-12-19 DIAGNOSIS — K5730 Diverticulosis of large intestine without perforation or abscess without bleeding: Secondary | ICD-10-CM | POA: Diagnosis not present

## 2022-12-19 DIAGNOSIS — I10 Essential (primary) hypertension: Secondary | ICD-10-CM | POA: Insufficient documentation

## 2022-12-19 DIAGNOSIS — J9811 Atelectasis: Secondary | ICD-10-CM | POA: Diagnosis not present

## 2022-12-19 DIAGNOSIS — N179 Acute kidney failure, unspecified: Secondary | ICD-10-CM | POA: Insufficient documentation

## 2022-12-19 DIAGNOSIS — Z7982 Long term (current) use of aspirin: Secondary | ICD-10-CM | POA: Insufficient documentation

## 2022-12-19 DIAGNOSIS — E119 Type 2 diabetes mellitus without complications: Secondary | ICD-10-CM | POA: Diagnosis not present

## 2022-12-19 DIAGNOSIS — Z1152 Encounter for screening for COVID-19: Secondary | ICD-10-CM | POA: Diagnosis not present

## 2022-12-19 DIAGNOSIS — R109 Unspecified abdominal pain: Secondary | ICD-10-CM | POA: Diagnosis not present

## 2022-12-19 DIAGNOSIS — R509 Fever, unspecified: Secondary | ICD-10-CM | POA: Diagnosis not present

## 2022-12-19 LAB — URINALYSIS, ROUTINE W REFLEX MICROSCOPIC
Bilirubin Urine: NEGATIVE
Glucose, UA: NEGATIVE mg/dL
Ketones, ur: NEGATIVE mg/dL
Nitrite: NEGATIVE
Protein, ur: 30 mg/dL — AB
RBC / HPF: >50 RBC/hpf (ref 0–5)
Specific Gravity, Urine: 1.017 (ref 1.005–1.030)
WBC, UA: >50 WBC/hpf (ref 0–5)
pH: 5.5 (ref 5.0–8.0)

## 2022-12-19 LAB — CBC WITH DIFFERENTIAL/PLATELET
Abs Immature Granulocytes: 0.28 10*3/uL — ABNORMAL HIGH (ref 0.00–0.07)
Basophils Absolute: 0 10*3/uL (ref 0.0–0.1)
Basophils Relative: 0 %
Eosinophils Absolute: 0.1 10*3/uL (ref 0.0–0.5)
Eosinophils Relative: 0 %
HCT: 39.2 % (ref 39.0–52.0)
Hemoglobin: 13.8 g/dL (ref 13.0–17.0)
Immature Granulocytes: 2 %
Lymphocytes Relative: 6 %
Lymphs Abs: 1.1 10*3/uL (ref 0.7–4.0)
MCH: 32.4 pg (ref 26.0–34.0)
MCHC: 35.2 g/dL (ref 30.0–36.0)
MCV: 92 fL (ref 80.0–100.0)
Monocytes Absolute: 1.6 10*3/uL — ABNORMAL HIGH (ref 0.1–1.0)
Monocytes Relative: 9 %
Neutro Abs: 14.4 10*3/uL — ABNORMAL HIGH (ref 1.7–7.7)
Neutrophils Relative %: 83 %
Platelets: 155 10*3/uL (ref 150–400)
RBC: 4.26 MIL/uL (ref 4.22–5.81)
RDW: 13.3 % (ref 11.5–15.5)
WBC: 17.4 10*3/uL — ABNORMAL HIGH (ref 4.0–10.5)
nRBC: 0 % (ref 0.0–0.2)

## 2022-12-19 LAB — COMPREHENSIVE METABOLIC PANEL
ALT: 15 U/L (ref 0–44)
AST: 23 U/L (ref 15–41)
Albumin: 3.7 g/dL (ref 3.5–5.0)
Alkaline Phosphatase: 47 U/L (ref 38–126)
Anion gap: 11 (ref 5–15)
BUN: 69 mg/dL — ABNORMAL HIGH (ref 8–23)
CO2: 23 mmol/L (ref 22–32)
Calcium: 8.7 mg/dL — ABNORMAL LOW (ref 8.9–10.3)
Chloride: 99 mmol/L (ref 98–111)
Creatinine, Ser: 2.66 mg/dL — ABNORMAL HIGH (ref 0.61–1.24)
GFR, Estimated: 25 mL/min — ABNORMAL LOW (ref >60)
Glucose, Bld: 114 mg/dL — ABNORMAL HIGH (ref 70–99)
Potassium: 3.5 mmol/L (ref 3.5–5.1)
Sodium: 133 mmol/L — ABNORMAL LOW (ref 135–145)
Total Bilirubin: 0.7 mg/dL (ref 0.3–1.2)
Total Protein: 6.4 g/dL — ABNORMAL LOW (ref 6.5–8.1)

## 2022-12-19 LAB — RESP PANEL BY RT-PCR (RSV, FLU A&B, COVID)  RVPGX2
Influenza A by PCR: NEGATIVE
Influenza B by PCR: NEGATIVE
Resp Syncytial Virus by PCR: NEGATIVE
SARS Coronavirus 2 by RT PCR: NEGATIVE

## 2022-12-19 LAB — LACTIC ACID, PLASMA
Lactic Acid, Venous: 1.1 mmol/L (ref 0.5–1.9)
Lactic Acid, Venous: 1.1 mmol/L (ref 0.5–1.9)

## 2022-12-19 MED ORDER — SODIUM CHLORIDE 0.9 % IV BOLUS
1000.0000 mL | Freq: Once | INTRAVENOUS | Status: AC
  Administered 2022-12-19: 1000 mL via INTRAVENOUS

## 2022-12-19 MED ORDER — DOXYCYCLINE HYCLATE 100 MG PO CAPS: 100.0000 mg | ORAL_CAPSULE | Freq: Two times a day (BID) | ORAL | 0 refills | Status: DC

## 2022-12-19 MED ORDER — SODIUM CHLORIDE 0.9 % IV SOLN
1.0000 g | Freq: Once | INTRAVENOUS | Status: AC
  Administered 2022-12-19: 1 g via INTRAVENOUS
  Filled 2022-12-19: qty 10

## 2022-12-19 NOTE — ED Triage Notes (Signed)
Patient here POV from Home.  Endorses Onset of Fever that began Sunday (101-102F). Associated with Chills since as well along with Aches and Lightheadedness.   Some Loose Stools. No N/V. Mild Cough. No Sore Throat. No localized Pain.   NAD Noted during Triage. A&Ox4. GCS 15. Ambulatory.

## 2022-12-19 NOTE — Discharge Instructions (Signed)
As we discussed, you have a urinary tract infection.  You also have abnormal kidney function test.  You need to take doxycycline twice a day for a week  You also need to stay hydrated and have your kidney function rechecked with your doctor in a week  You should follow-up with your primary care doctor and urologist.  Return to ER if you have persistent fever or trouble urinating or severe abdominal pain

## 2022-12-19 NOTE — ED Provider Notes (Signed)
[place] EMERGENCY DEPARTMENT AT [facility] Provider Note   CSN: [id number] Arrival date & time: 12/19/22  1650     History  Chief Complaint  Patient presents with   Fever    Samuel [name] is a 72 y.o. male history of hypertension, diabetes here presenting with fever.  Patient states that he started running a fever for the last 2 days.  He was having 10 1-1 02 temperatures.  He was visiting his daughter at that time.  He had some chills and bodyaches as well.  Patient also has some loose stools and nonproductive cough.  Patient had a home negative COVID test.  Patient called his doctor and sent in for further evaluation.  The history is provided by the patient.       Home Medications Prior to Admission medications   Medication Sig Start Date End Date Taking? Authorizing Provider  alclomethasone (ACLOVATE) 0.05 % cream Apply topically 2 (two) times daily as needed (Rash). 09/22/20   Sheffield, Judye Bos, PA-C  allopurinol (ZYLOPRIM) 100 MG tablet TAKE 2 TABLETS BY MOUTH DAILY 09/25/22   Burchette, Elberta Fortis, MD  aspirin EC 81 MG tablet Take 81 mg by mouth daily. Swallow whole.    [provider]  atorvastatin (LIPITOR) 20 MG tablet TAKE 1 TABLET BY MOUTH DAILY 11/27/22   Burchette, Elberta Fortis, MD  clobetasol (OLUX) 0.05 % topical foam APPLY TO AFFECTED AREA(S) TWO TIMES A DAY 10/18/21   Sheffield, Harvin Hazel R, PA-C  colchicine 0.6 MG tablet Take 1 tablet (0.6 mg total) by mouth daily. 03/01/22   Lenn Sink, DPM  colchicine 0.6 MG tablet TAKE 1 TABLET BY MOUTH TWICE A DAY 10/20/22   Lenn Sink, DPM  dorzolamide-timolol (COSOPT) 2-0.5 % ophthalmic solution INSTILL 1 DROP IN [facility] EYE DAILY 11/23/22   Rennis Chris, MD  famotidine (PEPCID) 20 MG tablet Take 20 mg by mouth daily.    [provider]  glucosamine-chondroitin 500-400 MG tablet Take 1 tablet by mouth daily.    [provider]  hydrochlorothiazide (HYDRODIURIL) 25 MG tablet TAKE 1 TABLET(25 MG)  BY MOUTH DAILY 10/02/22   Burchette, Elberta Fortis, MD  indomethacin (INDOCIN) 25 MG capsule Take 1-2 capsules by mouth every 8 hours with food as needed for gout flare. 12/01/22   Burchette, Elberta Fortis, MD  ketoconazole (NIZORAL) 2 % cream APPLY 1 APPLICATION TOPICALLY IN THE MORNING AND AT BEDTIME 01/11/22   Sheffield, Kelli R, PA-C  lisinopril (ZESTRIL) 20 MG tablet TAKE 1 TABLET(20 MG) BY MOUTH DAILY 12/23/21   Burchette, Elberta Fortis, MD  metoprolol tartrate (LOPRESSOR) 50 MG tablet TAKE 1 TABLET(50 MG) BY MOUTH DAILY 12/15/22   Burchette, Elberta Fortis, MD  Multiple Vitamin (MULTIVITAMIN) tablet Take 1 tablet by mouth daily.    [provider]  oseltamivir (TAMIFLU) 75 MG capsule Take 1 capsule (75 mg total) by mouth 2 (two) times daily. 08/23/22   Burchette, Elberta Fortis, MD  sildenafil (VIAGRA) 100 MG tablet TAKE 1/2 TO 1 TABLET BY MOUTH DAILY AS NEEDED FOR FOR ERECTILE DYSFUNCTION  NOT A DAILY MEDICATION 11/20/22   Burchette, Elberta Fortis, MD  silodosin (RAPAFLO) 8 MG CAPS capsule silodosin 8 mg capsule 11/08/21   [provider]  UNABLE TO FIND Take 1 tablet by mouth daily. Med Name: Relief Factor.    [provider]  Vibegron (GEMTESA) 75 MG TABS 1 tablet every day by oral route. 02/10/22   [provider]  Allergies    Penicillins    Review of Systems   Review of Systems  Constitutional:  Positive for fever.  Respiratory:  Positive for cough.   All other systems reviewed and are negative.   Physical Exam Updated Vital Signs BP 104/65   Pulse 75   Temp 99.4 F (37.4 C) (Oral)   Resp 18   Ht 5\' 9"  (1.753 m)   Wt 108.9 kg   SpO2 95%   BMI 35.44 kg/m  Physical Exam Vitals and nursing note reviewed.  Constitutional:      Appearance: Normal appearance.  HENT:     Head: Normocephalic.     Nose: Nose normal.     Mouth/Throat:     Mouth: Mucous membranes are dry.  Eyes:     Extraocular Movements: Extraocular movements intact.     Pupils: Pupils are equal, round, and  reactive to light.  Cardiovascular:     Rate and Rhythm: Normal rate and regular rhythm.     Pulses: Normal pulses.     Heart sounds: Normal heart sounds.  Pulmonary:     Effort: Pulmonary effort is normal.     Comments: Diminished bilateral bases Abdominal:     General: Abdomen is flat.     Palpations: Abdomen is soft.  Musculoskeletal:        General: Normal range of motion.     Cervical back: Normal range of motion and neck supple.  Skin:    General: Skin is warm.     Capillary Refill: Capillary refill takes less than 2 seconds.  Neurological:     General: No focal deficit present.     Mental Status: He is alert and oriented to person, place, and time.  Psychiatric:        Mood and Affect: Mood normal.        Behavior: Behavior normal.     ED Results / Procedures / Treatments   Labs (all labs ordered are listed, but only abnormal results are displayed) Labs Reviewed  CBC WITH DIFFERENTIAL/PLATELET - Abnormal; Notable for the following components:      Result Value   WBC 17.4 (*)    Neutro Abs 14.4 (*)    Monocytes Absolute 1.6 (*)    Abs Immature Granulocytes 0.28 (*)    All other components within normal limits  RESP PANEL BY RT-PCR (RSV, FLU A&B, COVID)  [id number]  COMPREHENSIVE METABOLIC PANEL  URINALYSIS, ROUTINE W REFLEX MICROSCOPIC    EKG None  Radiology DG Chest 2 View  Result Date: 12/19/2022 CLINICAL DATA:  Fever EXAM: CHEST - 2 VIEW COMPARISON:  None Available. FINDINGS: Elevation left hemidiaphragm with the adjacent linear opacity likely scar or atelectasis. No pneumothorax, effusion or edema. Normal cardiopericardial silhouette. Air-fluid level along the stomach beneath the left hemidiaphragm. Degenerative changes along the spine. IMPRESSION: Elevated left hemidiaphragm with the adjacent atelectasis. Electronically Signed   By: Karen Kays M.D.   On: 12/19/2022 18:04    Procedures Procedures    Medications Ordered in ED Medications  sodium chloride  0.9 % bolus 1,000 mL (1,000 mLs Intravenous New Bag/Given 12/19/22 1752)    ED Course/ Medical Decision Making/ A&P                             Medical Decision Making Samuel [name] is a 73 y.o. male here presenting with cough and fever.  Likely viral syndrome especially gastroenteritis.  But given presence  of cough and persistent fever consider pneumonia as well.  Plan to get CBC CMP and chest x-ray and UA and hydrate and reassess.  11:17 PM I reviewed patient's labs and patient's white count is 17.  Patient does have an AKI with creatinine 2.6.  Patient's chest x-ray showed questionable pneumonia versus atelectasis.  CT abdomen pelvis did not show any hydronephrosis.  Patient's UA does show greater than 50 whites and many bacteria.  I think the source of his fever is likely UTI.  He was given 2 L normal saline bolus.  He does not want to stay in the hospital.  Given his AKI, he was given Rocephin but I will send him home with doxycycline which does not need to be renally adjusted.  Urine culture is sent.  I told him that he needs to see his doctor in a week to get his kidney function rechecked and he needs to stay hydrated.  Problems Addressed: Acute cystitis with hematuria: acute illness or injury AKI (acute kidney injury) (HCC): acute illness or injury  Amount and/or Complexity of Data Reviewed Labs: ordered. Decision-making details documented in ED Course. Radiology: ordered and independent interpretation performed. Decision-making details documented in ED Course.  Final Clinical Impression(s) / ED Diagnoses Final diagnoses:  None    Rx / DC Orders ED Discharge Orders     None         Charlynne Pander, MD 12/19/22 2318

## 2022-12-21 LAB — URINE CULTURE: Culture: 50000 — AB

## 2022-12-22 ENCOUNTER — Telehealth: Payer: Self-pay

## 2022-12-22 LAB — URINE CULTURE

## 2022-12-22 NOTE — Telephone Encounter (Signed)
Transition Care Management Follow-up Telephone Call Date of discharge and from where: [facility] 6/11 How have you been since you were released from the hospital? Doing good  Any questions or concerns? No  Items Reviewed: Did the pt receive and understand the discharge instructions provided? Yes  Medications obtained and verified? Yes  Other? No  Any new allergies since your discharge? No  Dietary orders reviewed? No Do you have support at home? No    Follow up appointments reviewed:  PCP Hospital f/u appt confirmed? Yes  Scheduled to see PCP on 6/18 @ . Specialist Hospital f/u appt confirmed? No  Scheduled to see  on  @ . Are transportation arrangements needed? No  If their condition worsens, is the pt aware to call PCP or go to the Emergency Dept.? Yes Was the patient provided with contact information for the PCP's office or ED? Yes Was to pt encouraged to call back with questions or concerns? Yes

## 2022-12-23 ENCOUNTER — Telehealth (HOSPITAL_BASED_OUTPATIENT_CLINIC_OR_DEPARTMENT_OTHER): Payer: Self-pay

## 2022-12-23 LAB — CULTURE, BLOOD (ROUTINE X 2): Special Requests: ADEQUATE

## 2022-12-23 NOTE — Progress Notes (Signed)
ED Antimicrobial Stewardship Positive Culture Follow Up   Samuel [name] is an 72 y.o. male who presented to [facility] on 12/19/2022 with a chief complaint of  Chief Complaint  Patient presents with   Fever    Recent Results (from the past 720 hour(s))  Resp panel by RT-PCR (RSV, Flu A&B, Covid) Anterior Nasal Swab     Status: None   Collection Time: 12/19/22  5:07 PM   Specimen: Anterior Nasal Swab  Result Value Ref Range Status   SARS Coronavirus 2 by RT PCR NEGATIVE NEGATIVE Final    Comment: (NOTE) SARS-CoV-2 target nucleic acids are NOT DETECTED.  The SARS-CoV-2 RNA is generally detectable in upper respiratory specimens during the acute phase of infection. The lowest concentration of SARS-CoV-2 viral copies this assay can detect is 138 copies/mL. A negative result does not preclude SARS-Cov-2 infection and should not be used as the sole basis for treatment or other patient management decisions. A negative result may occur with  improper specimen collection/handling, submission of specimen other than nasopharyngeal swab, presence of viral mutation(s) within the areas targeted by this assay, and inadequate number of viral copies(<138 copies/mL). A negative result must be combined with clinical observations, patient history, and epidemiological information. The expected result is Negative.  Fact Sheet for Patients:  BloggerCourse.com  Fact Sheet for Healthcare Providers:  SeriousBroker.it  This test is no t yet approved or cleared by the [place] FDA and  has been authorized for detection and/or diagnosis of SARS-CoV-2 by FDA under an Emergency Use Authorization (EUA). This EUA will remain  in effect (meaning this test can be used) for the duration of the COVID-19 declaration under Section 564(b)(1) of the Act, 21 U.S.C.section 360bbb-3(b)(1), unless the authorization is terminated  or revoked sooner.        Influenza A by PCR NEGATIVE NEGATIVE Final   Influenza B by PCR NEGATIVE NEGATIVE Final    Comment: (NOTE) The Xpert Xpress SARS-CoV-2/FLU/RSV plus assay is intended as an aid in the diagnosis of influenza from Nasopharyngeal swab specimens and should not be used as a sole basis for treatment. Nasal washings and aspirates are unacceptable for Xpert Xpress SARS-CoV-2/FLU/RSV testing.  Fact Sheet for Patients: BloggerCourse.com  Fact Sheet for Healthcare Providers: SeriousBroker.it  This test is not yet approved or cleared by the [place] FDA and has been authorized for detection and/or diagnosis of SARS-CoV-2 by FDA under an Emergency Use Authorization (EUA). This EUA will remain in effect (meaning this test can be used) for the duration of the COVID-19 declaration under Section 564(b)(1) of the Act, 21 U.S.C. section 360bbb-3(b)(1), unless the authorization is terminated or revoked.     Resp Syncytial Virus by PCR NEGATIVE NEGATIVE Final    Comment: (NOTE) Fact Sheet for Patients: BloggerCourse.com  Fact Sheet for Healthcare Providers: SeriousBroker.it  This test is not yet approved or cleared by the [place] FDA and has been authorized for detection and/or diagnosis of SARS-CoV-2 by FDA under an Emergency Use Authorization (EUA). This EUA will remain in effect (meaning this test can be used) for the duration of the COVID-19 declaration under Section 564(b)(1) of the Act, 21 U.S.C. section 360bbb-3(b)(1), unless the authorization is terminated or revoked.  Performed at [facility], [address] [place], [place] [address]   Blood culture (routine x 2)     Status: None (Preliminary result)   Collection Time: 12/19/22  6:44 PM   Specimen: BLOOD  Result Value Ref Range Status  Specimen Description   Final    BLOOD RIGHT ANTECUBITAL Performed  at [facility], [address], [place], [place] [address]    Special Requests   Final    BOTTLES DRAWN AEROBIC AND ANAEROBIC Blood Culture adequate volume Performed at [facility], [address] [place], [place] [address]    Culture   Final    NO GROWTH 4 DAYS Performed at [facility], [address] [place], [place] [address]    Report Status PENDING  Incomplete  Urine Culture     Status: Abnormal   Collection Time: 12/19/22 11:00 PM   Specimen: Urine, Clean Catch  Result Value Ref Range Status   Specimen Description   Final    URINE, CLEAN CATCH Performed at [facility], [address] [place], [place] [address]    Special Requests   Final    NONE Performed at [facility], [address], [place], [place] [address]    Culture 50,000 COLONIES/mL ESCHERICHIA COLI (A)  Final   Report Status 12/22/2022 FINAL  Final   Organism ID, Bacteria ESCHERICHIA COLI (A)  Final      Susceptibility   Escherichia coli - MIC*    AMPICILLIN <=2 SENSITIVE Sensitive     CEFAZOLIN <=4 SENSITIVE Sensitive     CEFEPIME <=0.12 SENSITIVE Sensitive     CEFTRIAXONE <=0.25 SENSITIVE Sensitive     CIPROFLOXACIN <=0.25 SENSITIVE Sensitive     GENTAMICIN <=1 SENSITIVE Sensitive     IMIPENEM <=0.25 SENSITIVE Sensitive     NITROFURANTOIN <=16 SENSITIVE Sensitive     TRIMETH/SULFA <=20 SENSITIVE Sensitive     AMPICILLIN/SULBACTAM <=2 SENSITIVE Sensitive     PIP/TAZO <=4 SENSITIVE Sensitive     * 50,000 COLONIES/mL ESCHERICHIA COLI    [x]  Treated with doxycycline, organism  potentially resistant to prescribed antimicrobial  New antibiotic prescription: Stop doxycycline, start cefdinir 300mg  BID x 7 days.   Patient with PCN allergy but tolerated ceftriaxone at ED visit on 6/11.   ED Provider: Antony Madura, PA-C   Estill Batten, PharmD, BCCCP  12/23/2022, 11:13 AM Clinical Pharmacist Monday - Friday phone -   [phone] Saturday - Sunday phone - [phone]

## 2022-12-24 LAB — CULTURE, BLOOD (ROUTINE X 2): Culture: NO GROWTH

## 2022-12-26 ENCOUNTER — Encounter: Payer: Self-pay | Admitting: Family Medicine

## 2022-12-26 ENCOUNTER — Ambulatory Visit (INDEPENDENT_AMBULATORY_CARE_PROVIDER_SITE_OTHER): Payer: Medicare Other | Admitting: Family Medicine

## 2022-12-26 VITALS — BP 98/60 | HR 55 | Temp 97.9°F | Ht 69.0 in | Wt 241.2 lb

## 2022-12-26 DIAGNOSIS — N390 Urinary tract infection, site not specified: Secondary | ICD-10-CM

## 2022-12-26 DIAGNOSIS — B9620 Unspecified Escherichia coli [E. coli] as the cause of diseases classified elsewhere: Secondary | ICD-10-CM | POA: Diagnosis not present

## 2022-12-26 DIAGNOSIS — I10 Essential (primary) hypertension: Secondary | ICD-10-CM | POA: Diagnosis not present

## 2022-12-26 DIAGNOSIS — N179 Acute kidney failure, unspecified: Secondary | ICD-10-CM | POA: Diagnosis not present

## 2022-12-26 LAB — BASIC METABOLIC PANEL
BUN: 32 mg/dL — ABNORMAL HIGH (ref 6–23)
CO2: 29 mEq/L (ref 19–32)
Calcium: 9.6 mg/dL (ref 8.4–10.5)
Chloride: 100 mEq/L (ref 96–112)
Creatinine, Ser: 1.11 mg/dL (ref 0.40–1.50)
GFR: 67.55 mL/min (ref >60.00)
Glucose, Bld: 104 mg/dL — ABNORMAL HIGH (ref 70–99)
Potassium: 4.1 mEq/L (ref 3.5–5.1)
Sodium: 137 mEq/L (ref 135–145)

## 2022-12-26 NOTE — Patient Instructions (Signed)
Stay well hydrated  Follow up for any fever or other concerns.   

## 2022-12-26 NOTE — Progress Notes (Signed)
Established Patient Office Visit  Subjective   Patient ID: Samuel [name], male    DOB: 1950/11/29  Age: 72 y.o. MRN: [id number]  Chief Complaint  Patient presents with   Hospitalization Follow-up    HPI   [name] seen for ER follow-up.  He did call here last week with some onset of chills and mild cough Sunday a week ago.  He had been at his daughter and granddaughter's house visiting when symptoms occurred suddenly.  We advised home COVID test which came back negative.  He was advised then to go to the ER.  He had evidence for acute kidney injury with creatinine 2.6 and BUN of 69.  Chest x-ray showed some atelectasis but no clear pneumonia.  Respiratory panel in the ER was negative.  Urinalysis showed greater than 50 white blood cells and many bacteria.  Urine culture subsequently grew out E. coli.  CT renal stone study showed no kidney stones or hydronephrosis.  He was given Rocephin and then set on doxycycline.  This was subsequently changed 2 days later to cefdinir.  Doing well at this time.  No recurrent fever or chills.  No burning with urination.  Keeping on fluids well.  No nausea or vomiting.  No flank pain.  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   severe- right anterior shoulder- (EXC)   Atypical nevus 07/18/2011   mild-mod-right upper abdomen   Back pain    Basal cell carcinoma 07/17/2019   nod-left forearm-([id number])   COVID 01/07/2021   EPISTAXIS, RECURRENT 12/21/2009   ESSENTIAL HYPERTENSION 12/21/2009   Hypertension    Joint pain    Other fatigue    Prediabetes    Retinal detachment    OD   Shortness of breath on exertion    VENTRICULAR HYPERTROPHY, LEFT 01/04/2010   Past Surgical History:  Procedure Laterality Date   CATARACT EXTRACTION Bilateral 2009   EYE SURGERY Bilateral 2009   cataract   GAS/FLUID EXCHANGE Right 09/26/2018   Procedure: Gas/Fluid Exchange;   Surgeon: Rennis Chris, MD;  Location: [facility] OR;  Service: Ophthalmology;  Laterality: Right;   PHOTOCOAGULATION WITH LASER Right 09/26/2018   Procedure: Photocoagulation With Laser;  Surgeon: Rennis Chris, MD;  Location: [facility];  Service: Ophthalmology;  Laterality: Right;   SCLERAL BUCKLE Right 09/26/2018   Procedure: Scleral Buckle;  Surgeon: Rennis Chris, MD;  Location: [facility] OR;  Service: Ophthalmology;  Laterality: Right;   TOTAL HIP ARTHROPLASTY Right 02/16/2022   VITRECTOMY 25 GAUGE WITH SCLERAL BUCKLE Right 09/26/2018   Procedure: VITRECTOMY 25 GAUGE;  Surgeon: Rennis Chris, MD;  Location: [facility] OR;  Service: Ophthalmology;  Laterality: Right;    reports that he has been smoking cigars and cigarettes. He has a 18.00 pack-year smoking history. He has never used smokeless tobacco. He reports current alcohol use of about 14.0 standard drinks of alcohol per week. He reports that he does not use drugs. family history includes Cancer in his mother and paternal grandmother; Heart disease in his father, maternal grandfather, and paternal grandfather; Hypertension in his father, maternal grandfather, mother, and paternal grandfather. Allergies  Allergen Reactions   Penicillins     REACTION: hives    Review of Systems  Constitutional:  Negative for chills, fever and malaise/fatigue.  Eyes:  Negative for blurred vision.  Respiratory:  Negative for shortness of breath.   Cardiovascular:  Negative for chest pain.  Genitourinary:  Negative for dysuria, flank pain and hematuria.  Neurological:  Negative for dizziness, weakness and headaches.      Objective:     BP 98/60 (BP Location: Left Arm, Patient Position: Sitting, Cuff Size: Large)   Pulse (!) 55   Temp 97.9 F (36.6 C) (Oral)   Ht 5\' 9"  (1.753 m)   Wt 241 lb 3.2 oz (109.4 kg)   SpO2 98%   BMI 35.62 kg/m  BP Readings from Last 3 Encounters:  12/26/22 98/60  12/19/22 113/64  03/23/22 (!) 112/50   Wt Readings from Last 3  Encounters:  12/26/22 241 lb 3.2 oz (109.4 kg)  12/19/22 240 lb (108.9 kg)  08/23/22 235 lb (106.6 kg)      Physical Exam Vitals reviewed.  Constitutional:      General: He is not in acute distress.    Appearance: He is well-developed. He is not ill-appearing.  HENT:     Right Ear: External ear normal.     Left Ear: External ear normal.  Eyes:     Pupils: Pupils are equal, round, and reactive to light.  Neck:     Thyroid: No thyromegaly.  Cardiovascular:     Rate and Rhythm: Normal rate and regular rhythm.  Pulmonary:     Effort: Pulmonary effort is normal. No respiratory distress.     Breath sounds: Normal breath sounds. No wheezing or rales.  Musculoskeletal:     Cervical back: Neck supple.  Neurological:     Mental Status: He is alert and oriented to person, place, and time.      No results found for any visits on 12/26/22.  Last CBC Lab Results  Component Value Date   WBC 17.4 (H) 12/19/2022   HGB 13.8 12/19/2022   HCT 39.2 12/19/2022   MCV 92.0 12/19/2022   MCH 32.4 12/19/2022   RDW 13.3 12/19/2022   PLT 155 12/19/2022   Last metabolic panel Lab Results  Component Value Date   GLUCOSE 114 (H) 12/19/2022   NA 133 (L) 12/19/2022   K 3.5 12/19/2022   CL 99 12/19/2022   CO2 23 12/19/2022   BUN 69 (H) 12/19/2022   CREATININE 2.66 (H) 12/19/2022   GFRNONAA 25 (L) 12/19/2022   CALCIUM 8.7 (L) 12/19/2022   PROT 6.4 (L) 12/19/2022   ALBUMIN 3.7 12/19/2022   LABGLOB 2.5 12/15/2021   AGRATIO 1.8 12/15/2021   BILITOT 0.7 12/19/2022   ALKPHOS 47 12/19/2022   AST 23 12/19/2022   ALT 15 12/19/2022   ANIONGAP 11 12/19/2022   Last lipids Lab Results  Component Value Date   CHOL 120 12/15/2021   HDL 48 12/15/2021   LDLCALC 55 12/15/2021   TRIG 91 12/15/2021   CHOLHDL 2.5 12/15/2021   Last hemoglobin A1c Lab Results  Component Value Date   HGBA1C 5.7 (H) 12/15/2021   Last thyroid functions Lab Results  Component Value Date   TSH 2.130 03/01/2021       The ASCVD Risk score (Arnett DK, et al., 2019) failed to calculate for the following reasons:   The valid total cholesterol range is 130 to 320 mg/dL    Assessment & Plan:   #1 recent UTI with E. coli with acute kidney injury probably related to dehydration and infection.  -Stay well-hydrated -Follow-up immediately for any recurrent fever or urinary symptoms -Check basic metabolic panel to reassess kidney function -We followed up his blood culture and this came back negative.  #2 hypertension stable and well-controlled  No follow-ups on file.    Carolann Littler, MD

## 2022-12-27 ENCOUNTER — Other Ambulatory Visit: Payer: Self-pay | Admitting: Family Medicine

## 2022-12-31 ENCOUNTER — Other Ambulatory Visit: Payer: Self-pay | Admitting: Family Medicine

## 2023-01-02 MED ORDER — HYDROCHLOROTHIAZIDE 25 MG PO TABS: ORAL_TABLET | ORAL | 0 refills | Status: DC

## 2023-01-14 ENCOUNTER — Encounter: Payer: Self-pay | Admitting: Family Medicine

## 2023-01-15 MED ORDER — LISINOPRIL 20 MG PO TABS: ORAL_TABLET | ORAL | 0 refills | Status: DC

## 2023-01-21 ENCOUNTER — Other Ambulatory Visit: Payer: Self-pay | Admitting: Family Medicine

## 2023-01-23 DIAGNOSIS — N401 Enlarged prostate with lower urinary tract symptoms: Secondary | ICD-10-CM | POA: Diagnosis not present

## 2023-01-23 DIAGNOSIS — R3911 Hesitancy of micturition: Secondary | ICD-10-CM | POA: Diagnosis not present

## 2023-01-23 DIAGNOSIS — N3000 Acute cystitis without hematuria: Secondary | ICD-10-CM | POA: Diagnosis not present

## 2023-01-24 ENCOUNTER — Other Ambulatory Visit: Payer: Self-pay | Admitting: Podiatry

## 2023-02-07 ENCOUNTER — Encounter (INDEPENDENT_AMBULATORY_CARE_PROVIDER_SITE_OTHER): Payer: Self-pay

## 2023-02-21 ENCOUNTER — Other Ambulatory Visit: Payer: Self-pay | Admitting: Family Medicine

## 2023-02-21 DIAGNOSIS — E7849 Other hyperlipidemia: Secondary | ICD-10-CM

## 2023-02-24 ENCOUNTER — Other Ambulatory Visit: Payer: Self-pay | Admitting: Family Medicine

## 2023-02-26 MED ORDER — SILDENAFIL CITRATE 100 MG PO TABS: ORAL_TABLET | ORAL | 0 refills | Status: DC

## 2023-03-08 ENCOUNTER — Telehealth: Payer: Self-pay | Admitting: *Deleted

## 2023-03-08 ENCOUNTER — Ambulatory Visit (INDEPENDENT_AMBULATORY_CARE_PROVIDER_SITE_OTHER): Payer: Medicare Other | Admitting: Family Medicine

## 2023-03-08 ENCOUNTER — Encounter: Payer: Self-pay | Admitting: Family Medicine

## 2023-03-08 VITALS — Wt 238.0 lb

## 2023-03-08 DIAGNOSIS — Z0000 Encounter for general adult medical examination without abnormal findings: Secondary | ICD-10-CM | POA: Diagnosis not present

## 2023-03-08 MED ORDER — INDOMETHACIN 25 MG PO CAPS: ORAL_CAPSULE | ORAL | 0 refills | Status: DC

## 2023-03-08 MED ORDER — CLOBETASOL PROPIONATE 0.05 % EX FOAM: Freq: Two times a day (BID) | CUTANEOUS | 1 refills | Status: AC

## 2023-03-08 MED ORDER — INDOMETHACIN 25 MG PO CAPS: ORAL_CAPSULE | ORAL | 1 refills | Status: DC

## 2023-03-08 NOTE — Telephone Encounter (Signed)
Patient has a AWV scheduled with Dr Selena Batten today and requested refill on Clobetasol foam be sent to [facility] that was previously given by dermatologist that has now closed their office.  Patient is aware the request will be sent to PCP for review.  Also requested refill on Indomethacin and this was sent as previously prescribed by PCP.

## 2023-03-08 NOTE — Telephone Encounter (Signed)
Rx done. 

## 2023-03-08 NOTE — Addendum Note (Signed)
Addended by: Johnella Moloney on: 03/08/2023 04:12 PM   Modules accepted: Orders

## 2023-03-08 NOTE — Patient Instructions (Signed)
I really enjoyed getting to talk with you today! I am available on Tuesdays and Thursdays for virtual visits if you have any questions or concerns, or if I can be of any further assistance.   CHECKLIST FROM ANNUAL WELLNESS VISIT:  -Follow up (please call to schedule if not scheduled after visit):   -yearly for annual wellness visit with primary care office  Here is a list of your preventive care/health maintenance measures and the plan for each if any are due:  PLAN For any measures below that may be due:   Health Maintenance  Topic Date Due   DTaP/Tdap/Td (2 - Tdap) 02/19/2020   COVID-19 Vaccine (3 - Moderna risk series) 03/24/2023 (Originally 11/11/2019)   INFLUENZA VACCINE  10/08/2023 (Originally 02/08/2023)   Medicare Annual Wellness (AWV)  03/07/2024   Colonoscopy  03/23/2025   Pneumonia Vaccine 41+ Years old  Completed   Hepatitis C Screening  Completed   Zoster Vaccines- Shingrix  Completed   HPV VACCINES  Aged Out    -See a dentist at least yearly  -Get your eyes checked and then per your eye specialist's recommendations  -Other issues addressed today:   -I have included below further information regarding a healthy whole foods based diet, physical activity guidelines for adults, stress management and opportunities for social connections. I hope you find this information useful.   -----------------------------------------------------------------------------------------------------------------------------------------------------------------------------------------------------------------------------------------------------------  NUTRITION: -eat real food: lots of colorful vegetables (half the plate) and fruits -5-7 servings of vegetables and fruits per day (fresh or steamed is best), exp. 2 servings of vegetables with lunch and dinner and 2 servings of fruit per day. Berries and greens such as kale and collards are great choices.  -consume on a regular basis: whole grains  (make sure first ingredient on label contains the word "whole"), fresh fruits, fish, nuts, seeds, healthy oils (such as olive oil, avocado oil, grape seed oil) -may eat small amounts of dairy and lean meat on occasion, but avoid processed meats such as ham, bacon, lunch meat, etc. -drink water -try to avoid fast food and pre-packaged foods, processed meat -most experts advise limiting sodium to < 2300mg  per day, should limit further is any chronic conditions such as high blood pressure, heart disease, diabetes, etc. The American Heart Association advised that < 1500mg  is is ideal -try to avoid foods that contain any ingredients with names you do not recognize  -try to avoid sugar/sweets (except for the natural sugar that occurs in fresh fruit) -try to avoid sweet drinks -try to avoid white rice, white bread, pasta (unless whole grain), white or yellow potatoes  EXERCISE GUIDELINES FOR ADULTS: -if you wish to increase your physical activity, do so gradually and with the approval of your doctor -STOP and seek medical care immediately if you have any chest pain, chest discomfort or trouble breathing when starting or increasing exercise  -move and stretch your body, legs, feet and arms when sitting for long periods -Physical activity guidelines for optimal health in adults: -least 150 minutes per week of aerobic exercise (can talk, but not sing) once approved by your doctor, 20-30 minutes of sustained activity or two 10 minute episodes of sustained activity every day.  -resistance training at least 2 days per week if approved by your doctor -balance exercises 3+ days per week:   Stand somewhere where you have something sturdy to hold onto if you lose balance.    1) lift up on toes, start with 5x per day and work up to 20x  2) stand and lift on leg straight out to the side so that foot is a few inches of the floor, start with 5x each side and work up to 20x each side   3) stand on one foot, start  with 5 seconds each side and work up to 20 seconds on each side  If you need ideas or help with getting more active:  -Silver sneakers https://tools.silversneakers.com  -Walk with a Doc: http://www.duncan-williams.com/  -try to include resistance (weight lifting/strength building) and balance exercises twice per week: or the following link for ideas: http://castillo-powell.com/  BuyDucts.dk  STRESS MANAGEMENT: -can try meditating, or just sitting quietly with deep breathing while intentionally relaxing all parts of your body for 5 minutes daily -if you need further help with stress, anxiety or depression please follow up with your primary doctor or contact the wonderful folks at [facility]: [phone]  SOCIAL CONNECTIONS: -options in [place] if you wish to engage in more social and exercise related activities:  -Silver sneakers https://tools.silversneakers.com  -Walk with a Doc: http://www.duncan-williams.com/  -Check out the [facility] Active Adults 50+ section on the [place] [employer] (hiking clubs, book clubs, cards and games, chess, exercise classes, aquatic classes and much more) - see the website for details: https://www.[place].gov/departments/parks-recreation/active-adults50  -YouTube has lots of exercise videos for different ages and abilities as well  -[facility] (a variety of indoor and outdoor inperson activities for adults). [phone]. [address].  -Virtual Online Classes (a variety of topics): see seniorplanet.org or call ([phone]) [phone]  -consider volunteering at a school, hospice center, church, senior center or elsewhere

## 2023-03-08 NOTE — Progress Notes (Signed)
Patient unable to obtain vitals for virtual visit.

## 2023-03-08 NOTE — Progress Notes (Signed)
PATIENT CHECK-IN and HEALTH RISK ASSESSMENT QUESTIONNAIRE:  -completed by phone/video for upcoming Medicare Preventive Visit  Pre-Visit Check-in: 1)Vitals (height, wt, BP, etc) - record in vitals section for visit on day of visit Request home vitals (wt, BP, etc.) and enter into vitals, THEN update Vital Signs SmartPhrase below at the top of the HPI. See below.  2)Review and Update Medications, Allergies PMH, Surgeries, Social history in Epic 3)Hospitalizations in the last year with date/reason? no  4)Review and Update Care Team (patient's specialists) in Epic 5) Complete PHQ9 in Epic  6) Complete Fall Screening in Epic 7)Review all Health Maintenance Due and order under PCP if not done.  Medicare Wellness Patient Questionnaire:  Answer theses question about your habits: Do you drink alcohol? yes If yes, how many drinks do you have a day?1-2 Have you ever smoked?yes-cigars currently Quit date if applicable?   How many packs a day do/did you smoke? N/A cigar ever day to every month, enjoys and does not wish to give up Do you use smokeless tobacco?no Do you use an illicit drugs?no Do you exercises? No, but does get some walking on his job. Are you sexually active? Yes Number of partners?1 Typical breakfast-eggs, fruits Typical lunch-ham or [place] sandwich Typical dinner-fish, chicken or sometimes pork, veggies Typical snacks:energy bar  Beverages: coffee, water  Answer theses question about you: Can you perform most household chores?yes Do you find it hard to follow a conversation in a noisy room?no Do you often ask people to speak up or repeat themselves?no Do you feel that you have a problem with memory?no Do you balance your checkbook and or bank acounts?no Do you feel safe at home?yes Last dentist visit?2 months ago-upcoming appt for crown replacement Do you need assistance with any of the following: Please note if so   Driving? no  Feeding yourself? no  Getting from bed to  chair? no  Getting to the toilet? no  Bathing or showering? no  Dressing yourself?  no  Managing money? no  Climbing a flight of stairs-no  Preparing meals? no    Do you have Advanced Directives in place (Living Will, Healthcare Power or [place])? yes   Last eye Exam and location? Dr Tyrone Nine year ago   Do you currently use prescribed or non-prescribed narcotic or opioid pain medications?no  Do you have a history or close family history of breast, ovarian, tubal or peritoneal cancer or a family member with BRCA (breast cancer susceptibility 1 and 2) gene mutations? Sister had breast cancer  Request home vitals (wt, BP, etc.) and enter into vitals, THEN update Vital Signs SmartPhrase below at the top of the HPI. See below.   Nurse/Assistant Credentials/time stamp: Mellody Drown   ----------------------------------------------------------------------------------------------------------------------------------------------------------------------------------------------------------------------  Vital Signs: Because this visit was a virtual/telehealth visit, some criteria may be missing or patient reported. Any vitals not documented were not able to be obtained and vitals that have been documented are patient reported.    MEDICARE ANNUAL PREVENTIVE CARE VISIT WITH PROVIDER (Welcome to Medicare, initial annual wellness or annual wellness exam)  Virtual Visit via Video Note  I connected with Samuel [name] on 03/08/23  by a video enabled telemedicine application and verified that I am speaking with the correct person using two identifiers.  Location patient: home Location provider:work or home office Persons participating in the virtual visit: patient, provider  Concerns and/or follow up today: None.   Wanted refills on indomethacin and clobetasol - reports stable. Occasional gout flares.  See HM section in Epic for other details of completed HM.    ROS:  negative for report of fevers, unintentional weight loss, vision changes, vision loss, hearing loss or change, chest pain, sob, hemoptysis, melena, hematochezia, hematuria, falls, bleeding or bruising, thoughts of suicide or self harm, memory loss  Patient-completed extensive health risk assessment - reviewed and discussed with the patient: See Health Risk Assessment completed with patient prior to the visit either above or in recent phone note. This was reviewed in detailed with the patient today and appropriate recommendations, orders and referrals were placed as needed per Summary below and patient instructions.   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   severe- right anterior shoulder- (EXC)   Atypical nevus 07/18/2011   mild-mod-right upper abdomen   Back pain    Basal cell carcinoma 07/17/2019   nod-left forearm-([id number])   COVID 01/07/2021   EPISTAXIS, RECURRENT 12/21/2009   ESSENTIAL HYPERTENSION 12/21/2009   Hypertension    Joint pain    Other fatigue    Prediabetes    Retinal detachment    OD   Shortness of breath on exertion    VENTRICULAR HYPERTROPHY, LEFT 01/04/2010    Past Surgical History:  Procedure Laterality Date   CATARACT EXTRACTION Bilateral 2009   EYE SURGERY Bilateral 2009   cataract   GAS/FLUID EXCHANGE Right 09/26/2018   Procedure: Gas/Fluid Exchange;  Surgeon: Rennis Chris, MD;  Location: [facility] OR;  Service: Ophthalmology;  Laterality: Right;   PHOTOCOAGULATION WITH LASER Right 09/26/2018   Procedure: Photocoagulation With Laser;  Surgeon: Rennis Chris, MD;  Location: [facility] OR;  Service: Ophthalmology;   Laterality: Right;   SCLERAL BUCKLE Right 09/26/2018   Procedure: Scleral Buckle;  Surgeon: Rennis Chris, MD;  Location[facility] OR;  Service: Ophthalmology;  Laterality: Right;   TOTAL HIP ARTHROPLASTY Right 02/16/2022   VITRECTOMY 25 GAUGE WITH SCLERAL BUCKLE Right 09/26/2018   Procedure: VITRECTOMY 25 GAUGE;  Surgeon: Rennis Chris, MD;  Location: [facility] OR;  Service: Ophthalmology;  Laterality: Right;    Social History   Socioeconomic History   Marital status: Legally Separated    Spouse name: Not on file   Number of children: Not on file   Years of education: Not on file   Highest education level: Bachelor's degree (e.g., BA, AB, BS)  Occupational History   Occupation: Insuarnce agent  Tobacco Use   Smoking status: Some Days    Current packs/day: 0.00    Average packs/day: 1 pack/day for 18.0 years (18.0 ttl pk-yrs)    Types: Cigars, Cigarettes    Start date: 08/22/1970    Last attempt to quit: 08/22/1988    Years since quitting: 34.5   Smokeless tobacco: Never  Vaping Use   Vaping status: Never Used  Substance and Sexual Activity   Alcohol use: Yes    Alcohol/week: 14.0 standard drinks of alcohol    Types: 14 Standard drinks or equivalent per week    Comment: social   Drug use: Never   Sexual activity: Not Currently    Partners: Female  Other Topics Concern   Not on file  Social History Narrative   Not  on file   Social Determinants of Health   Financial Resource Strain: Low Risk  (03/08/2023)   Overall Financial Resource Strain (CARDIA)    Difficulty of Paying Living Expenses: Not hard at all  Food Insecurity: No Food Insecurity (03/08/2023)   Hunger Vital Sign    Worried About Running Out of Food in the Last Year: Never true    Ran Out of Food in the Last Year: Never true  Transportation Needs: No Transportation Needs (03/08/2023)   PRAPARE - Administrator, Civil Service (Medical): No    Lack of Transportation (Non-Medical): No  Physical Activity: Inactive  (03/08/2023)   Exercise Vital Sign    Days of Exercise per Week: 0 days    Minutes of Exercise per Session: 20 min  Stress: No Stress Concern Present (03/08/2023)   [employer] of Occupational Health - Occupational Stress Questionnaire    Feeling of Stress : Not at all  Social Connections: Moderately Integrated (03/08/2023)   Social Connection and Isolation Panel [NHANES]    Frequency of Communication with Friends and Family: Twice a week    Frequency of Social Gatherings with Friends and Family: Once a week    Attends Religious Services: 1 to 4 times per year    Active Member of [employer] or Organizations: Yes    Attends Banker Meetings: 1 to 4 times per year    Marital Status: Separated  Recent Concern: Social Connections - Moderately Isolated (12/25/2022)   Social Connection and Isolation Panel [NHANES]    Frequency of Communication with Friends and Family: More than three times a week    Frequency of Social Gatherings with Friends and Family: Once a week    Attends Religious Services: More than 4 times per year    Active Member of [employer] or Organizations: No    Attends Engineer, structural: Not on file    Marital Status: Separated  Intimate Partner Violence: Not At Risk (03/08/2023)   Humiliation, Afraid, Rape, and Kick questionnaire    Fear of Current or Ex-Partner: No    Emotionally Abused: No    Physically Abused: No    Sexually Abused: No    Family History  Problem Relation Age of Onset   Cancer Mother        bladder CA   Hypertension Mother    Hypertension Father    Heart disease Father    Heart disease Maternal Grandfather    Hypertension Maternal Grandfather    Cancer Paternal Grandmother        ovarian CA   Heart disease Paternal Grandfather    Hypertension Paternal Grandfather    Colon cancer Neg Hx     Current Outpatient Medications on File Prior to Visit  Medication Sig Dispense Refill   alclomethasone (ACLOVATE) 0.05 % cream Apply  topically 2 (two) times daily as needed (Rash). 180 g 3   alfuzosin (UROXATRAL) 10 MG 24 hr tablet Take 10 mg by mouth daily.     allopurinol (ZYLOPRIM) 100 MG tablet TAKE 2 TABLETS BY MOUTH DAILY 180 tablet 0   aspirin EC 81 MG tablet Take 81 mg by mouth daily. Swallow whole.     atorvastatin (LIPITOR) 20 MG tablet TAKE 1 TABLET BY MOUTH DAILY 30 tablet 0   clobetasol (OLUX) 0.05 % topical foam APPLY TO AFFECTED AREA(S) TWO TIMES A DAY (Patient taking differently: as needed.) 50 g 4   colchicine 0.6 MG tablet TAKE 1 TABLET BY MOUTH 2  TIMES A DAY 15 tablet 1   dorzolamide-timolol (COSOPT) 2-0.5 % ophthalmic solution INSTILL 1 DROP IN EACH EYE DAILY 10 mL 9   famotidine (PEPCID) 20 MG tablet Take 20 mg by mouth daily.     glucosamine-chondroitin 500-400 MG tablet Take 1 tablet by mouth daily.     hydrochlorothiazide (HYDRODIURIL) 25 MG tablet TAKE 1 TABLET(25 MG) BY MOUTH DAILY 90 tablet 0   ketoconazole (NIZORAL) 2 % cream APPLY 1 APPLICATION TOPICALLY IN THE MORNING AND AT BEDTIME 60 g 1   lisinopril (ZESTRIL) 20 MG tablet TAKE 1 TABLET(20 MG) BY MOUTH DAILY 90 tablet 0   metoprolol tartrate (LOPRESSOR) 50 MG tablet TAKE 1 TABLET(50 MG) BY MOUTH DAILY 90 tablet 0   Multiple Vitamin (MULTIVITAMIN) tablet Take 1 tablet by mouth daily.     sildenafil (VIAGRA) 100 MG tablet TAKE 0.5 TO 1 TABLET BY MOUTH AS NEEDED FOR ERECTILE DYSFUNCTION **THIS IS NOT A DAILY MEDICINE** 10 tablet 0   tolterodine (DETROL LA) 4 MG 24 hr capsule Take 4 mg by mouth daily.     UNABLE TO FIND Take 1 tablet by mouth daily. Med Name: Relief Factor.     No current facility-administered medications on file prior to visit.    Allergies  Allergen Reactions   Penicillins     REACTION: hives       Physical Exam Vitals requested from patient and listed below if patient had equipment and was able to obtain at home for this virtual visit: Today's Vitals   03/08/23 1025  Weight: 238 lb (108 kg)   Body mass index is  35.15 kg/m.   EKG (optional): deferred due to virtual visit  GENERAL: alert, oriented, no acute distress detected; full vision exam deferred due to pandemic and/or virtual encounter   HEENT: atraumatic, conjunttiva clear, no obvious abnormalities on inspection of external nose and ears  NECK: normal movements of the head and neck  LUNGS: on inspection no signs of respiratory distress, breathing rate appears normal, no obvious gross SOB, gasping or wheezing  CV: no obvious cyanosis  MS: moves all visible extremities without noticeable abnormality  PSYCH/NEURO: pleasant and cooperative, no obvious depression or anxiety, speech and thought processing grossly intact, Cognitive function grossly intact  Flowsheet Row Office Visit from 03/08/2023 in [facility]  PHQ-9 Total Score 0           03/08/2023    9:19 AM 03/02/2022   12:46 PM 12/13/2021   11:11 AM 03/01/2021    9:53 AM 02/16/2021   11:22 AM  Depression screen PHQ 2/9  Decreased Interest 0 0 0 1 0  Down, Depressed, Hopeless 0 0 0 0 0  PHQ - 2 Score 0 0 0 1 0  Altered sleeping 0   1   Tired, decreased energy 0   1   Change in appetite 0   0   Feeling bad or failure about yourself  0   0   Trouble concentrating 0   0   Moving slowly or fidgety/restless 0   0   Suicidal thoughts 0   0   PHQ-9 Score 0   3   Difficult doing work/chores    Not difficult at all        02/16/2021   11:25 AM 12/13/2021   11:11 AM 03/02/2022   12:29 PM 03/02/2022   12:50 PM 03/08/2023    9:20 AM  Fall Risk  Falls in the past year?  0 0 0 0 0  Was there an injury with Fall? 0 0  0 0  Fall Risk Category Calculator 0 0  0 0  Fall Risk Category (Retired) Low Low  Low   (RETIRED) Patient Fall Risk Level  Low fall risk  Low fall risk   Patient at Risk for Falls Due to Impaired balance/gait;Impaired vision;Impaired mobility No Fall Risks  No Fall Risks No Fall Risks  Fall risk Follow up Falls prevention discussed  Falls evaluation completed   Falls evaluation completed     SUMMARY AND PLAN:  Encounter for Medicare annual wellness exam    Discussed applicable health maintenance/preventive health measures and advised and referred or ordered per patient preferences: -discussed vaccines and recommended per CDC guidelines, he declined the flu and covid vaccines -reports will get the tetanus booster, advised to let us know/bring record so that we can update when he does Health Maintenance  Topic Date Due   DTaP/Tdap/Td (2 - Tdap) 02/19/2020   COVID-19 Vaccine (3 - Moderna risk series) 03/24/2023 (Originally 11/11/2019)   INFLUENZA VACCINE  10/08/2023 (Originally 02/08/2023)   Medicare Annual Wellness (AWV)  03/07/2024   Colonoscopy  03/23/2025   Pneumonia Vaccine 24+ Years old  Completed   Hepatitis C Screening  Completed   Zoster Vaccines- Shingrix  Completed   HPV VACCINES  Aged Out    Education and counseling on the following was provided based on the above review of health and a plan/checklist for the patient, along with additional information discussed, was provided for the patient in the patient instructions :  -Advised and counseled on a healthy lifestyle - including the importance of a healthy diet, regular physical activity, social connections and stress management. -Reviewed patient's current diet. Advised and counseled on a whole foods based healthy diet. A summary of a healthy diet was provided in the Patient Instructions.  He has significantly improved his diet after working with healthy weight clinic and has lost 30 lbs. Congratulated and encouraged to continue whole foods based diet, plant heavy, avoiding highly processed foods as much as possible.  -reviewed patient's current physical activity level and discussed exercise guidelines for adults. Discussed community resources and ideas for safe exercise at home to assist in meeting exercise guideline recommendations in a safe and healthy way.   -Advise yearly dental visits at minimum and regular eye exams -Advised and counseled on alcohol safe limits, risks/ tobacco use, risks of smoking   Follow up: see patient instructions   Patient Instructions  I really enjoyed getting to talk with you today! I am available on Tuesdays and Thursdays for virtual visits if you have any questions or concerns, or if I can be of any further assistance.   CHECKLIST FROM ANNUAL WELLNESS VISIT:  -Follow up (please call to schedule if not scheduled after visit):   -yearly for annual wellness visit with primary care office  Here is a list of your preventive care/health maintenance measures and the plan for each if any are due:  PLAN For any measures below that may be due:   Health Maintenance  Topic Date Due   DTaP/Tdap/Td (2 - Tdap) 02/19/2020   COVID-19 Vaccine (3 - Moderna risk series) 03/24/2023 (Originally 11/11/2019)   INFLUENZA VACCINE  10/08/2023 (Originally 02/08/2023)   Medicare Annual Wellness (AWV)  03/07/2024   Colonoscopy  03/23/2025   Pneumonia Vaccine 107+ Years old  Completed   Hepatitis C Screening  Completed   Zoster Vaccines- Shingrix  Completed  HPV VACCINES  Aged Out    -See a dentist at least yearly  -Get your eyes checked and then per your eye specialist's recommendations  -Other issues addressed today:   -I have included below further information regarding a healthy whole foods based diet, physical activity guidelines for adults, stress management and opportunities for social connections. I hope you find this information useful.   -----------------------------------------------------------------------------------------------------------------------------------------------------------------------------------------------------------------------------------------------------------  NUTRITION: -eat real food: lots of colorful vegetables (half the plate) and fruits -5-7 servings of vegetables and fruits per day (fresh  or steamed is best), exp. 2 servings of vegetables with lunch and dinner and 2 servings of fruit per day. Berries and greens such as kale and collards are great choices.  -consume on a regular basis: whole grains (make sure first ingredient on label contains the word "whole"), fresh fruits, fish, nuts, seeds, healthy oils (such as olive oil, avocado oil, grape seed oil) -may eat small amounts of dairy and lean meat on occasion, but avoid processed meats such as ham, bacon, lunch meat, etc. -drink water -try to avoid fast food and pre-packaged foods, processed meat -most experts advise limiting sodium to < 2300mg  per day, should limit further is any chronic conditions such as high blood pressure, heart disease, diabetes, etc. The American Heart Association advised that < 1500mg  is is ideal -try to avoid foods that contain any ingredients with names you do not recognize  -try to avoid sugar/sweets (except for the natural sugar that occurs in fresh fruit) -try to avoid sweet drinks -try to avoid white rice, white bread, pasta (unless whole grain), white or yellow potatoes  EXERCISE GUIDELINES FOR ADULTS: -if you wish to increase your physical activity, do so gradually and with the approval of your doctor -STOP and seek medical care immediately if you have any chest pain, chest discomfort or trouble breathing when starting or increasing exercise  -move and stretch your body, legs, feet and arms when sitting for long periods -Physical activity guidelines for optimal health in adults: -least 150 minutes per week of aerobic exercise (can talk, but not sing) once approved by your doctor, 20-30 minutes of sustained activity or two 10 minute episodes of sustained activity every day.  -resistance training at least 2 days per week if approved by your doctor -balance exercises 3+ days per week:   Stand somewhere where you have something sturdy to hold onto if you lose balance.    1) lift up on toes, start  with 5x per day and work up to 20x   2) stand and lift on leg straight out to the side so that foot is a few inches of the floor, start with 5x each side and work up to 20x each side   3) stand on one foot, start with 5 seconds each side and work up to 20 seconds on each side  If you need ideas or help with getting more active:  -Silver sneakers https://tools.silversneakers.com  -Walk with a Doc: http://www.duncan-williams.com/  -try to include resistance (weight lifting/strength building) and balance exercises twice per week: or the following link for ideas: http://castillo-powell.com/  BuyDucts.dk  STRESS MANAGEMENT: -can try meditating, or just sitting quietly with deep breathing while intentionally relaxing all parts of your body for 5 minutes daily -if you need further help with stress, anxiety or depression please follow up with your primary doctor or contact the wonderful folks at [facility]: [phone]  SOCIAL CONNECTIONS: -options in [place] if you wish to engage in more social  and exercise related activities:  -Silver sneakers https://tools.silversneakers.com  -Walk with a Doc: http://www.duncan-williams.com/  -Check out the [facility] Active Adults 50+ section on the [place] [employer] (hiking clubs, book clubs, cards and games, chess, exercise classes, aquatic classes and much more) - see the website for details: https://www.[place].gov/departments/parks-recreation/active-adults50  -YouTube has lots of exercise videos for different ages and abilities as well  -[facility] (a variety of indoor and outdoor inperson activities for adults). [phone]. [address].  -Virtual Online Classes (a variety of topics): see seniorplanet.org or call ([phone]) 311-0722  -consider volunteering at a school, hospice center, church, senior center or  elsewhere           Terressa Koyanagi, DO

## 2023-03-15 ENCOUNTER — Other Ambulatory Visit: Payer: Self-pay | Admitting: Family Medicine

## 2023-03-15 DIAGNOSIS — E7849 Other hyperlipidemia: Secondary | ICD-10-CM

## 2023-03-24 ENCOUNTER — Other Ambulatory Visit: Payer: Self-pay | Admitting: Family Medicine

## 2023-04-05 ENCOUNTER — Other Ambulatory Visit: Payer: Self-pay | Admitting: Family Medicine

## 2023-04-06 MED ORDER — METOPROLOL TARTRATE 50 MG PO TABS: ORAL_TABLET | ORAL | 0 refills | Status: DC

## 2023-04-06 MED ORDER — HYDROCHLOROTHIAZIDE 25 MG PO TABS: ORAL_TABLET | ORAL | 0 refills | Status: DC

## 2023-04-08 ENCOUNTER — Other Ambulatory Visit: Payer: Self-pay | Admitting: Family Medicine

## 2023-04-09 MED ORDER — LISINOPRIL 20 MG PO TABS: ORAL_TABLET | ORAL | 0 refills | Status: DC

## 2023-04-11 ENCOUNTER — Other Ambulatory Visit: Payer: Self-pay

## 2023-04-11 DIAGNOSIS — E7849 Other hyperlipidemia: Secondary | ICD-10-CM

## 2023-04-11 MED ORDER — ATORVASTATIN CALCIUM 20 MG PO TABS
20.0000 mg | ORAL_TABLET | Freq: Every day | ORAL | 0 refills | Status: DC
Start: 2023-04-11 — End: 2023-04-13

## 2023-04-13 ENCOUNTER — Other Ambulatory Visit: Payer: Self-pay | Admitting: Family Medicine

## 2023-04-13 DIAGNOSIS — E7849 Other hyperlipidemia: Secondary | ICD-10-CM

## 2023-04-27 ENCOUNTER — Other Ambulatory Visit: Payer: Self-pay | Admitting: Podiatry

## 2023-04-30 ENCOUNTER — Encounter: Payer: Self-pay | Admitting: Family Medicine

## 2023-04-30 ENCOUNTER — Other Ambulatory Visit: Payer: Self-pay | Admitting: Family Medicine

## 2023-04-30 MED ORDER — SILDENAFIL CITRATE 100 MG PO TABS: ORAL_TABLET | ORAL | 0 refills | Status: DC

## 2023-05-02 IMAGING — CT CT CARDIAC CORONARY ARTERY CALCIUM SCORE
3 series · 14 of 20 positions shown, 15 images · non-contrast
Comparison: None.
COMPARISON: None.

Addendum:
EXAM:
OVER-READ INTERPRETATION  CT CHEST

The following report is an over-read performed by radiologist Dr.
Miledy Enlow [REDACTED] on 11/30/2020. This
over-read does not include interpretation of cardiac or coronary
anatomy or pathology. The coronary calcium score interpretation by
the cardiologist is attached.
CLINICAL DATA: Cardiovascular Disease Risk stratification
Coronary Calcium Score
TECHNIQUE: A gated, non-contrast computed tomography scan of the heart was
performed using 3mm slice thickness. Axial images were analyzed on a
dedicated workstation. Calcium scoring of the coronary arteries was
performed using the Agatston method.

[Series 2: casc 3.0 bv41 2 bestdiast 66 % · axial · 0.47mm/px · z∈[-250,-178]mm · 4 of 42 slices shown, 5 images]
[im 9/42  vessel]
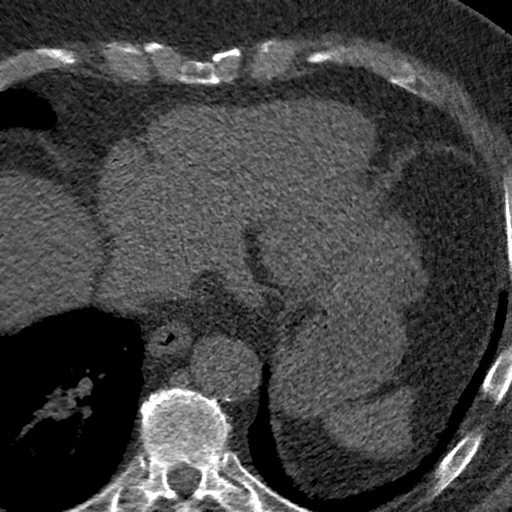
[im 9/42  lung]
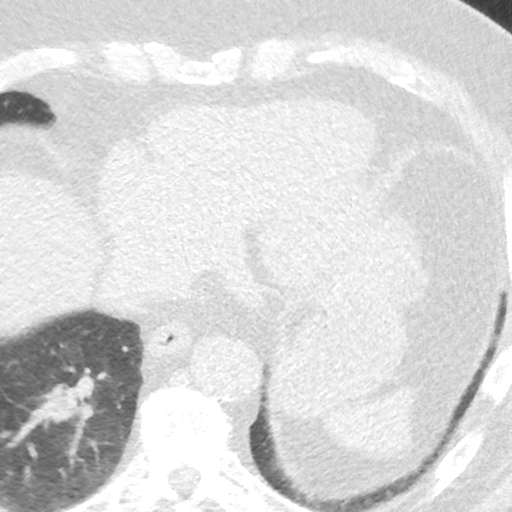
[im 17/42  vessel]
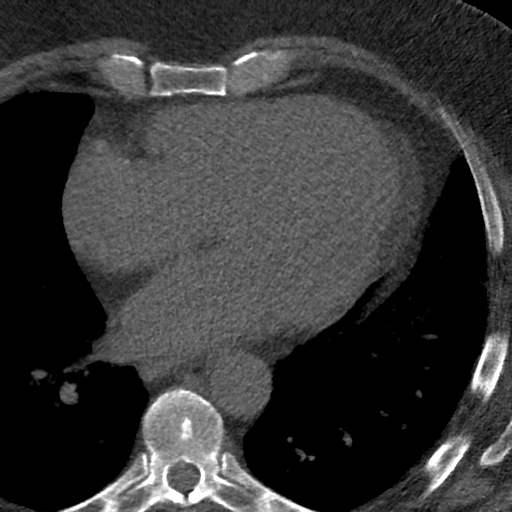
[im 25/42  vessel]
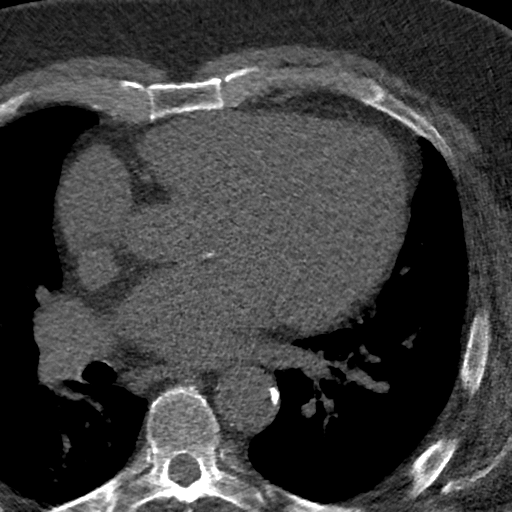
[im 33/42  vessel]
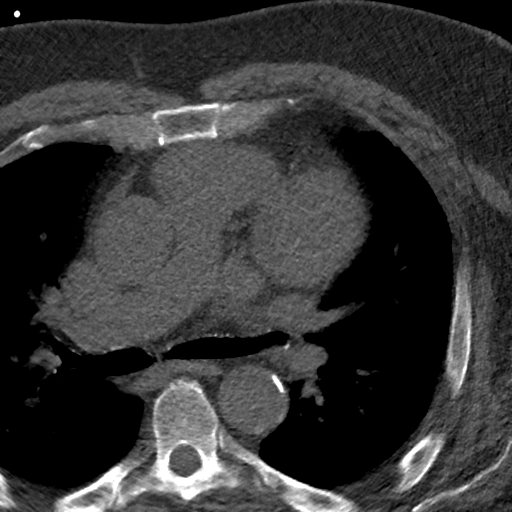

[Series 3: lung 66 % · axial · 0.75mm/px · z∈[-256,-172]mm · 5 of 42 slices shown]
[im 7/42  lung]
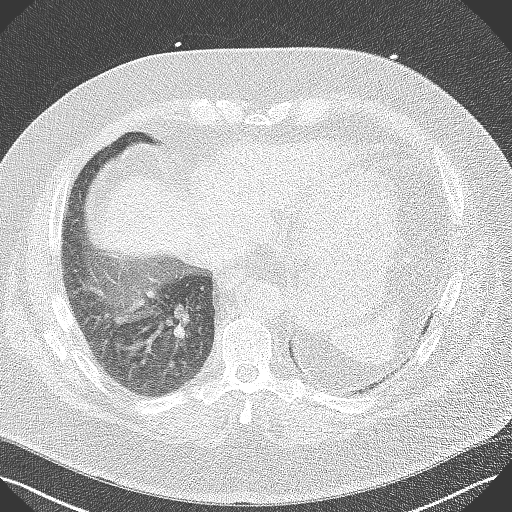
[im 14/42  lung]
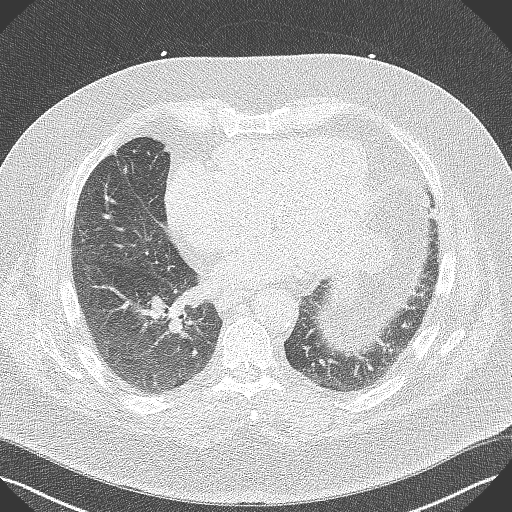
[im 21/42  lung]
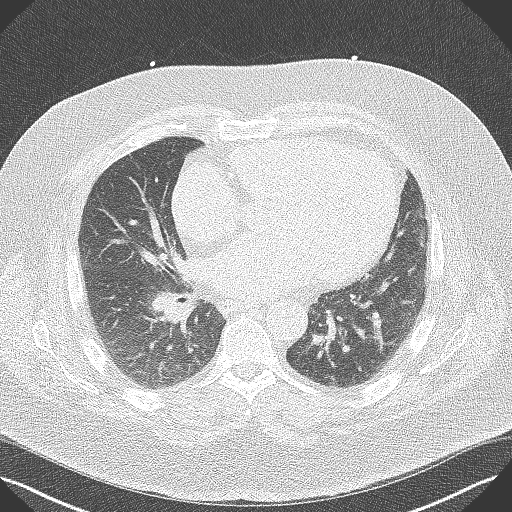
[im 28/42  lung]
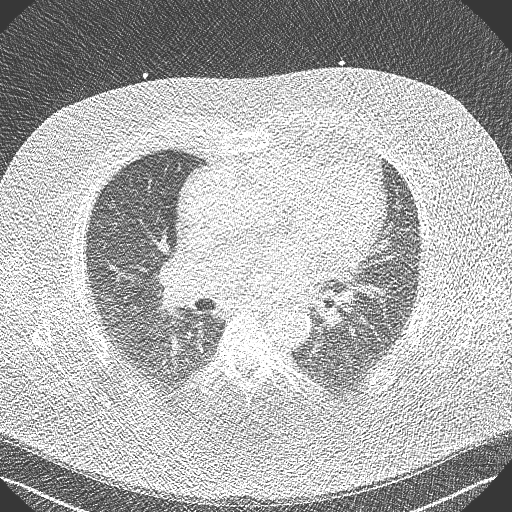
[im 35/42  lung]
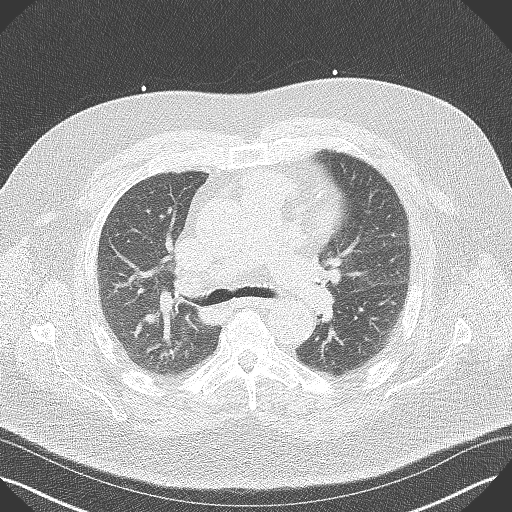

[Series 4: lung st 66 % · axial · 0.75mm/px · z∈[-256,-172]mm · 5 of 42 slices shown]
[im 7/42  lung]
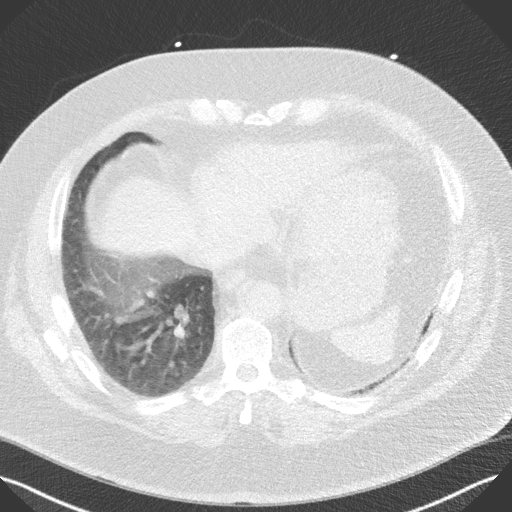
[im 14/42  lung]
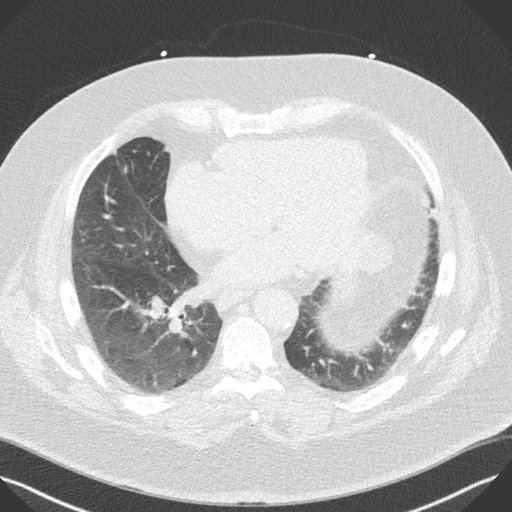
[im 21/42  lung]
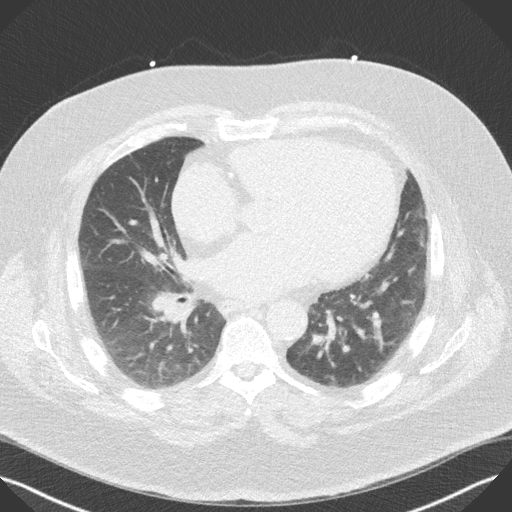
[im 28/42  lung]
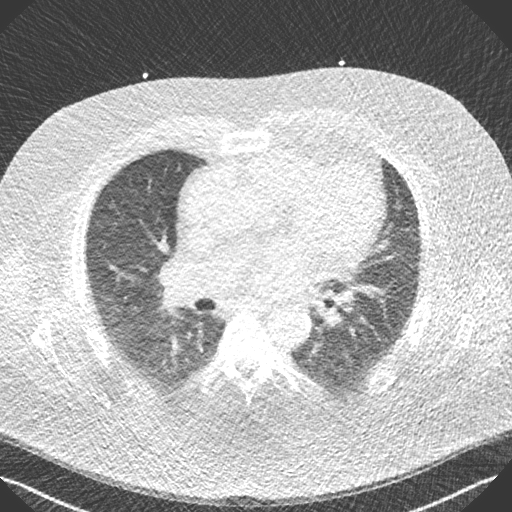
[im 35/42  lung]
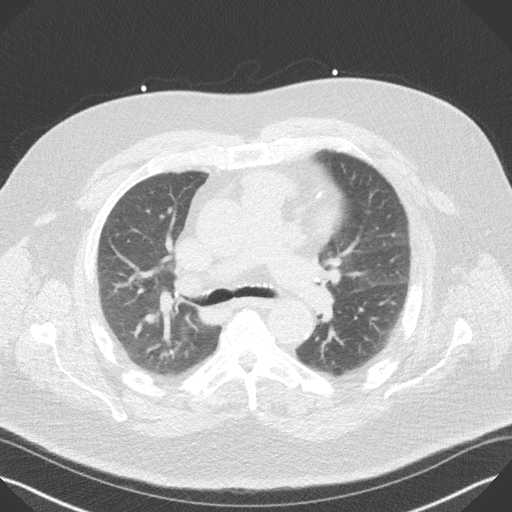

[14 of 20 positions shown; findings below may reference images not displayed]

FINDINGS: Vascular: Atherosclerosis of the thoracic aorta. Visualized segments
of the thoracic aorta are of normal caliber. The heart is mildly
enlarged.

Mediastinum/Nodes: Visualized mediastinum and hilar regions
demonstrate no lymphadenopathy.

Lungs/Pleura: Visualized lungs show no evidence of pulmonary edema,
consolidation, pneumothorax, nodule or pleural fluid.

Upper Abdomen: No acute abnormality.

Musculoskeletal: No chest wall mass or suspicious bone lesions
identified.
IMPRESSION: 1. Thoracic aortic atherosclerosis without visualized aneurysmal
disease.
2. Mild cardiac enlargement.
FINDINGS: Coronary Calcium Score:

Left main: 0

Left anterior descending artery:

Left circumflex artery: 0

Right coronary artery: 166

Total: 208

Percentile: 63

Pericardium: Normal.

Ascending Aorta: Normal caliber; aortic atherosclerosis noted.

Non-cardiac: See separate report from [REDACTED].
IMPRESSION: Coronary calcium score of 208. This was 63 percentile for age-,
race-, and sex-matched controls.



If CAC=0, it is reasonable to withhold statin therapy and reassess
in 5 to 10 years, as long as higher risk conditions are absent
(diabetes mellitus, family history of premature CHD in first degree
relatives (males <55 years; females <65 years), cigarette smoking,
or LDL >=190 mg/dL).

If CAC is 1 to 99, it is reasonable to initiate statin therapy for
patients >=55 years of age.

If CAC is >=100 or >=75th percentile, it is reasonable to initiate
statin therapy at any age.

Cardiology referral should be considered for patients with CAC
scores >=400 or >=75th percentile.

*0801 AHA/ACC/AACVPR/AAPA/ABC/ANALEE/VLAD/ZEINAB/Ceejay/CORBBITT/RTOYOTA/NIDIA ESTER
Guideline on the Management of Blood Cholesterol: A Report of the
American College of Cardiology/American Heart Association Task Force
on Clinical Practice Guidelines. J Am Coll Cardiol.
7221;73(24):4381-4813.

*** End of Addendum ***
EXAM:
OVER-READ INTERPRETATION  CT CHEST

The following report is an over-read performed by radiologist Dr.
Miledy Enlow [REDACTED] on 11/30/2020. This
over-read does not include interpretation of cardiac or coronary
anatomy or pathology. The coronary calcium score interpretation by
the cardiologist is attached.
FINDINGS: Vascular: Atherosclerosis of the thoracic aorta. Visualized segments
of the thoracic aorta are of normal caliber. The heart is mildly
enlarged.

Mediastinum/Nodes: Visualized mediastinum and hilar regions
demonstrate no lymphadenopathy.

Lungs/Pleura: Visualized lungs show no evidence of pulmonary edema,
consolidation, pneumothorax, nodule or pleural fluid.

Upper Abdomen: No acute abnormality.

Musculoskeletal: No chest wall mass or suspicious bone lesions
identified.
IMPRESSION: 1. Thoracic aortic atherosclerosis without visualized aneurysmal
disease.
2. Mild cardiac enlargement.

## 2023-05-08 ENCOUNTER — Other Ambulatory Visit: Payer: Self-pay | Admitting: Family Medicine

## 2023-05-08 DIAGNOSIS — E7849 Other hyperlipidemia: Secondary | ICD-10-CM

## 2023-05-31 ENCOUNTER — Encounter: Payer: Self-pay | Admitting: Family Medicine

## 2023-06-01 MED ORDER — INDOMETHACIN 25 MG PO CAPS: ORAL_CAPSULE | ORAL | 1 refills | Status: DC

## 2023-06-21 ENCOUNTER — Other Ambulatory Visit: Payer: Self-pay | Admitting: Family Medicine

## 2023-06-22 ENCOUNTER — Other Ambulatory Visit: Payer: Self-pay | Admitting: Family Medicine

## 2023-06-25 DIAGNOSIS — R350 Frequency of micturition: Secondary | ICD-10-CM | POA: Diagnosis not present

## 2023-06-25 DIAGNOSIS — Z125 Encounter for screening for malignant neoplasm of prostate: Secondary | ICD-10-CM | POA: Diagnosis not present

## 2023-06-25 DIAGNOSIS — N401 Enlarged prostate with lower urinary tract symptoms: Secondary | ICD-10-CM | POA: Diagnosis not present

## 2023-06-25 DIAGNOSIS — N5201 Erectile dysfunction due to arterial insufficiency: Secondary | ICD-10-CM | POA: Diagnosis not present

## 2023-06-25 MED ORDER — SILDENAFIL CITRATE 100 MG PO TABS: ORAL_TABLET | ORAL | 1 refills | Status: DC

## 2023-07-01 ENCOUNTER — Other Ambulatory Visit: Payer: Self-pay | Admitting: Family Medicine

## 2023-07-01 DIAGNOSIS — E7849 Other hyperlipidemia: Secondary | ICD-10-CM

## 2023-07-03 ENCOUNTER — Other Ambulatory Visit: Payer: Self-pay | Admitting: Family Medicine

## 2023-07-05 MED ORDER — METOPROLOL TARTRATE 50 MG PO TABS: ORAL_TABLET | ORAL | 0 refills | Status: DC

## 2023-07-05 MED ORDER — HYDROCHLOROTHIAZIDE 25 MG PO TABS: ORAL_TABLET | ORAL | 0 refills | Status: DC

## 2023-07-22 ENCOUNTER — Other Ambulatory Visit: Payer: Self-pay | Admitting: Family Medicine

## 2023-07-23 MED ORDER — LISINOPRIL 20 MG PO TABS: ORAL_TABLET | ORAL | 0 refills | Status: DC

## 2023-07-28 ENCOUNTER — Other Ambulatory Visit: Payer: Self-pay | Admitting: Family Medicine

## 2023-07-28 DIAGNOSIS — E7849 Other hyperlipidemia: Secondary | ICD-10-CM

## 2023-08-16 DIAGNOSIS — B353 Tinea pedis: Secondary | ICD-10-CM | POA: Diagnosis not present

## 2023-08-16 DIAGNOSIS — L570 Actinic keratosis: Secondary | ICD-10-CM | POA: Diagnosis not present

## 2023-08-16 DIAGNOSIS — D485 Neoplasm of uncertain behavior of skin: Secondary | ICD-10-CM | POA: Diagnosis not present

## 2023-08-16 DIAGNOSIS — C44619 Basal cell carcinoma of skin of left upper limb, including shoulder: Secondary | ICD-10-CM | POA: Diagnosis not present

## 2023-08-16 DIAGNOSIS — D044 Carcinoma in situ of skin of scalp and neck: Secondary | ICD-10-CM | POA: Diagnosis not present

## 2023-08-16 DIAGNOSIS — B351 Tinea unguium: Secondary | ICD-10-CM | POA: Diagnosis not present

## 2023-08-16 DIAGNOSIS — D1801 Hemangioma of skin and subcutaneous tissue: Secondary | ICD-10-CM | POA: Diagnosis not present

## 2023-08-16 DIAGNOSIS — Z1283 Encounter for screening for malignant neoplasm of skin: Secondary | ICD-10-CM | POA: Diagnosis not present

## 2023-08-25 ENCOUNTER — Other Ambulatory Visit: Payer: Self-pay | Admitting: Family Medicine

## 2023-08-25 DIAGNOSIS — E7849 Other hyperlipidemia: Secondary | ICD-10-CM

## 2023-09-03 DIAGNOSIS — D044 Carcinoma in situ of skin of scalp and neck: Secondary | ICD-10-CM | POA: Diagnosis not present

## 2023-09-03 DIAGNOSIS — C44619 Basal cell carcinoma of skin of left upper limb, including shoulder: Secondary | ICD-10-CM | POA: Diagnosis not present

## 2023-09-06 ENCOUNTER — Encounter: Payer: Self-pay | Admitting: Family Medicine

## 2023-09-07 MED ORDER — NIRMATRELVIR/RITONAVIR (PAXLOVID)TABLET: 3.0000 | ORAL_TABLET | Freq: Two times a day (BID) | ORAL | 0 refills | Status: AC

## 2023-09-17 ENCOUNTER — Other Ambulatory Visit: Payer: Self-pay | Admitting: Family Medicine

## 2023-09-17 DIAGNOSIS — C44619 Basal cell carcinoma of skin of left upper limb, including shoulder: Secondary | ICD-10-CM | POA: Diagnosis not present

## 2023-09-17 DIAGNOSIS — D044 Carcinoma in situ of skin of scalp and neck: Secondary | ICD-10-CM | POA: Diagnosis not present

## 2023-09-18 DIAGNOSIS — C44619 Basal cell carcinoma of skin of left upper limb, including shoulder: Secondary | ICD-10-CM | POA: Diagnosis not present

## 2023-09-18 DIAGNOSIS — D044 Carcinoma in situ of skin of scalp and neck: Secondary | ICD-10-CM | POA: Diagnosis not present

## 2023-09-19 DIAGNOSIS — D044 Carcinoma in situ of skin of scalp and neck: Secondary | ICD-10-CM | POA: Diagnosis not present

## 2023-09-19 DIAGNOSIS — C44619 Basal cell carcinoma of skin of left upper limb, including shoulder: Secondary | ICD-10-CM | POA: Diagnosis not present

## 2023-09-20 DIAGNOSIS — C44619 Basal cell carcinoma of skin of left upper limb, including shoulder: Secondary | ICD-10-CM | POA: Diagnosis not present

## 2023-09-20 DIAGNOSIS — D044 Carcinoma in situ of skin of scalp and neck: Secondary | ICD-10-CM | POA: Diagnosis not present

## 2023-09-24 DIAGNOSIS — D044 Carcinoma in situ of skin of scalp and neck: Secondary | ICD-10-CM | POA: Diagnosis not present

## 2023-09-24 DIAGNOSIS — C44619 Basal cell carcinoma of skin of left upper limb, including shoulder: Secondary | ICD-10-CM | POA: Diagnosis not present

## 2023-09-25 DIAGNOSIS — D044 Carcinoma in situ of skin of scalp and neck: Secondary | ICD-10-CM | POA: Diagnosis not present

## 2023-09-25 DIAGNOSIS — C44619 Basal cell carcinoma of skin of left upper limb, including shoulder: Secondary | ICD-10-CM | POA: Diagnosis not present

## 2023-09-26 DIAGNOSIS — D044 Carcinoma in situ of skin of scalp and neck: Secondary | ICD-10-CM | POA: Diagnosis not present

## 2023-09-26 DIAGNOSIS — C44619 Basal cell carcinoma of skin of left upper limb, including shoulder: Secondary | ICD-10-CM | POA: Diagnosis not present

## 2023-09-27 DIAGNOSIS — C44619 Basal cell carcinoma of skin of left upper limb, including shoulder: Secondary | ICD-10-CM | POA: Diagnosis not present

## 2023-09-27 DIAGNOSIS — D044 Carcinoma in situ of skin of scalp and neck: Secondary | ICD-10-CM | POA: Diagnosis not present

## 2023-10-01 ENCOUNTER — Other Ambulatory Visit: Payer: Self-pay | Admitting: Family Medicine

## 2023-10-01 DIAGNOSIS — E7849 Other hyperlipidemia: Secondary | ICD-10-CM

## 2023-10-02 DIAGNOSIS — D044 Carcinoma in situ of skin of scalp and neck: Secondary | ICD-10-CM | POA: Diagnosis not present

## 2023-10-02 DIAGNOSIS — C44619 Basal cell carcinoma of skin of left upper limb, including shoulder: Secondary | ICD-10-CM | POA: Diagnosis not present

## 2023-10-03 DIAGNOSIS — D044 Carcinoma in situ of skin of scalp and neck: Secondary | ICD-10-CM | POA: Diagnosis not present

## 2023-10-03 DIAGNOSIS — C44619 Basal cell carcinoma of skin of left upper limb, including shoulder: Secondary | ICD-10-CM | POA: Diagnosis not present

## 2023-10-04 ENCOUNTER — Encounter: Payer: Self-pay | Admitting: Family Medicine

## 2023-10-04 DIAGNOSIS — E7849 Other hyperlipidemia: Secondary | ICD-10-CM

## 2023-10-04 DIAGNOSIS — C44619 Basal cell carcinoma of skin of left upper limb, including shoulder: Secondary | ICD-10-CM | POA: Diagnosis not present

## 2023-10-04 DIAGNOSIS — D044 Carcinoma in situ of skin of scalp and neck: Secondary | ICD-10-CM | POA: Diagnosis not present

## 2023-10-04 MED ORDER — HYDROCHLOROTHIAZIDE 25 MG PO TABS: ORAL_TABLET | ORAL | 0 refills | Status: DC

## 2023-10-04 MED ORDER — METOPROLOL TARTRATE 50 MG PO TABS: ORAL_TABLET | ORAL | 0 refills | Status: DC

## 2023-10-05 MED ORDER — ATORVASTATIN CALCIUM 20 MG PO TABS: 20.0000 mg | ORAL_TABLET | Freq: Every day | ORAL | 0 refills | Status: DC

## 2023-10-05 NOTE — Addendum Note (Signed)
 Addended by: Christy Sartorius on: 10/05/2023 04:20 PM   Modules accepted: Orders

## 2023-10-08 DIAGNOSIS — D044 Carcinoma in situ of skin of scalp and neck: Secondary | ICD-10-CM | POA: Diagnosis not present

## 2023-10-08 DIAGNOSIS — C44619 Basal cell carcinoma of skin of left upper limb, including shoulder: Secondary | ICD-10-CM | POA: Diagnosis not present

## 2023-10-09 DIAGNOSIS — C44619 Basal cell carcinoma of skin of left upper limb, including shoulder: Secondary | ICD-10-CM | POA: Diagnosis not present

## 2023-10-09 DIAGNOSIS — D044 Carcinoma in situ of skin of scalp and neck: Secondary | ICD-10-CM | POA: Diagnosis not present

## 2023-10-10 DIAGNOSIS — C44619 Basal cell carcinoma of skin of left upper limb, including shoulder: Secondary | ICD-10-CM | POA: Diagnosis not present

## 2023-10-10 DIAGNOSIS — D044 Carcinoma in situ of skin of scalp and neck: Secondary | ICD-10-CM | POA: Diagnosis not present

## 2023-10-11 DIAGNOSIS — C44619 Basal cell carcinoma of skin of left upper limb, including shoulder: Secondary | ICD-10-CM | POA: Diagnosis not present

## 2023-10-11 DIAGNOSIS — D044 Carcinoma in situ of skin of scalp and neck: Secondary | ICD-10-CM | POA: Diagnosis not present

## 2023-10-12 ENCOUNTER — Encounter: Payer: Self-pay | Admitting: Family Medicine

## 2023-10-12 ENCOUNTER — Ambulatory Visit (INDEPENDENT_AMBULATORY_CARE_PROVIDER_SITE_OTHER): Admitting: Family Medicine

## 2023-10-12 VITALS — BP 100/56 | HR 60 | Temp 98.0°F | Wt 248.2 lb

## 2023-10-12 DIAGNOSIS — E7849 Other hyperlipidemia: Secondary | ICD-10-CM

## 2023-10-12 DIAGNOSIS — Z125 Encounter for screening for malignant neoplasm of prostate: Secondary | ICD-10-CM

## 2023-10-12 DIAGNOSIS — I10 Essential (primary) hypertension: Secondary | ICD-10-CM

## 2023-10-12 DIAGNOSIS — R7303 Prediabetes: Secondary | ICD-10-CM | POA: Diagnosis not present

## 2023-10-12 DIAGNOSIS — K760 Fatty (change of) liver, not elsewhere classified: Secondary | ICD-10-CM | POA: Diagnosis not present

## 2023-10-12 LAB — BASIC METABOLIC PANEL WITH GFR
BUN: 34 mg/dL — ABNORMAL HIGH (ref 6–23)
CO2: 30 meq/L (ref 19–32)
Calcium: 9.8 mg/dL (ref 8.4–10.5)
Chloride: 101 meq/L (ref 96–112)
Creatinine, Ser: 1.22 mg/dL (ref 0.40–1.50)
GFR: 59.97 mL/min — ABNORMAL LOW (ref >60.00)
Glucose, Bld: 163 mg/dL — ABNORMAL HIGH (ref 70–99)
Potassium: 4.1 meq/L (ref 3.5–5.1)
Sodium: 140 meq/L (ref 135–145)

## 2023-10-12 LAB — HEMOGLOBIN A1C: Hgb A1c MFr Bld: 5.7 % (ref 4.6–6.5)

## 2023-10-12 LAB — LIPID PANEL
Cholesterol: 115 mg/dL (ref 0–200)
HDL: 45.5 mg/dL (ref >39.00)
LDL Cholesterol: 53 mg/dL (ref 0–99)
NonHDL: 69.98
Total CHOL/HDL Ratio: 3
Triglycerides: 83 mg/dL (ref 0.0–149.0)
VLDL: 16.6 mg/dL (ref 0.0–40.0)

## 2023-10-12 LAB — HEPATIC FUNCTION PANEL
ALT: 19 U/L (ref 0–53)
AST: 23 U/L (ref 0–37)
Albumin: 4.4 g/dL (ref 3.5–5.2)
Alkaline Phosphatase: 48 U/L (ref 39–117)
Bilirubin, Direct: 0.3 mg/dL (ref 0.0–0.3)
Total Bilirubin: 0.9 mg/dL (ref 0.2–1.2)
Total Protein: 6.7 g/dL (ref 6.0–8.3)

## 2023-10-12 LAB — PSA, MEDICARE: PSA: 2.24 ng/mL (ref 0.10–4.00)

## 2023-10-12 MED ORDER — METOPROLOL SUCCINATE ER 50 MG PO TB24: 50.0000 mg | ORAL_TABLET | Freq: Every day | ORAL | 3 refills | Status: AC

## 2023-10-12 MED ORDER — LISINOPRIL 20 MG PO TABS: ORAL_TABLET | ORAL | 0 refills | Status: DC

## 2023-10-12 MED ORDER — HYDROCHLOROTHIAZIDE 25 MG PO TABS: ORAL_TABLET | ORAL | 0 refills | Status: DC

## 2023-10-12 MED ORDER — ATORVASTATIN CALCIUM 20 MG PO TABS: 20.0000 mg | ORAL_TABLET | Freq: Every day | ORAL | 3 refills | Status: AC

## 2023-10-12 NOTE — Progress Notes (Signed)
 Established Patient Office Visit  Subjective   Patient ID: Samuel [name], male    DOB: September 17, 1950  Age: 73 y.o. MRN: [id number]  Chief Complaint  Patient presents with   Medical Management of Chronic Issues    HPI   Samuel [name] is seen for medical follow-up.  He has history of hyperlipidemia, hypertension, nonalcoholic fatty liver disease, prediabetes.  Generally doing well.  He continues to work full-time.  His wife whom he has been separated from for 5 years was diagnosis past year with stage IV lung cancer.  Generally doing well overall.  Medications reviewed.  He remains on atorvastatin, HCTZ, metoprolol, allopurinol, lisinopril, and a couple of medications through urology including Detrol LA and Uroxatral     Denies any recent chest pains.  Does need refill of several medications.  Due for follow-up labs.  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   severe- right anterior shoulder- (EXC)   Atypical nevus 07/18/2011   mild-mod-right upper abdomen   Back pain    Basal cell carcinoma 07/17/2019   nod-left forearm-(CX35FU)   COVID 01/07/2021   EPISTAXIS, RECURRENT 12/21/2009   ESSENTIAL HYPERTENSION 12/21/2009   Hypertension    Joint pain    Other fatigue    Prediabetes    Retinal detachment    OD   Shortness of breath on exertion    VENTRICULAR HYPERTROPHY, LEFT 01/04/2010   Past Surgical History:  Procedure Laterality Date   CATARACT EXTRACTION Bilateral 2009   EYE SURGERY Bilateral 2009   cataract   GAS/FLUID EXCHANGE Right 09/26/2018   Procedure: Gas/Fluid Exchange;  Surgeon: Rennis Chris, MD;  Location: [facility] OR;  Service: Ophthalmology;  Laterality: Right;   PHOTOCOAGULATION WITH LASER Right 09/26/2018   Procedure: Photocoagulation With Laser;  Surgeon: Rennis Chris, MD;  Location: [facility] OR;  Service: Ophthalmology;  Laterality: Right;   SCLERAL BUCKLE Right 09/26/2018    Procedure: Scleral Buckle;  Surgeon: Rennis Chris, MD;  Location: [facility] OR;  Service: Ophthalmology;  Laterality: Right;   TOTAL HIP ARTHROPLASTY Right 02/16/2022   VITRECTOMY 25 GAUGE WITH SCLERAL BUCKLE Right 09/26/2018   Procedure: VITRECTOMY 25 GAUGE;  Surgeon: Rennis Chris, MD;  Location: [facility] OR;  Service: Ophthalmology;  Laterality: Right;    reports that he has been smoking cigars and cigarettes. He started smoking about 53 years ago. He has a 18 pack-year smoking history. He has never used smokeless tobacco. He reports current alcohol use of about 14.0 standard drinks of alcohol per week. He reports that he does not use drugs. family history includes Cancer in his mother and paternal grandmother; Heart disease in his father, maternal grandfather, and paternal grandfather; Hypertension in his father, maternal grandfather, mother, and paternal grandfather. Allergies  Allergen Reactions   Penicillins     REACTION: hives    Review of Systems  Constitutional:  Negative for malaise/fatigue.  Eyes:  Negative for blurred vision.  Respiratory:  Negative for shortness of breath.   Cardiovascular:  Negative for chest pain.  Gastrointestinal:  Negative for abdominal pain.  Neurological:  Negative for dizziness, weakness and headaches.      Objective:     BP (!) 100/56 (BP Location: Left Arm, Patient Position: Sitting, Cuff Size: Normal)   Pulse 60   Temp 98 F (36.7 C) (Oral)   Wt 248 lb 3.2 oz (112.6 kg)   SpO2 96%   BMI 36.65 kg/m  BP Readings from Last 3 Encounters:  10/12/23 (!) 100/56  12/26/22 98/60  12/19/22 113/64   Wt Readings from Last 3 Encounters:  10/12/23 248 lb 3.2 oz (112.6 kg)  03/08/23 238 lb (108 kg)  12/26/22 241 lb 3.2 oz (109.4 kg)      Physical Exam Vitals reviewed.  Constitutional:      General: He is not in acute distress.    Appearance: He is well-developed. He is not ill-appearing.  HENT:     Right Ear: External ear normal.     Left Ear:  External ear normal.  Eyes:     Pupils: Pupils are equal, round, and reactive to light.  Neck:     Thyroid: No thyromegaly.  Cardiovascular:     Rate and Rhythm: Normal rate and regular rhythm.  Pulmonary:     Effort: Pulmonary effort is normal. No respiratory distress.     Breath sounds: Normal breath sounds. No wheezing or rales.  Musculoskeletal:     Cervical back: Neck supple.  Neurological:     Mental Status: He is alert and oriented to person, place, and time.      No results found for any visits on 10/12/23.    The ASCVD Risk score (Arnett DK, et al., 2019) failed to calculate for the following reasons:   The valid total cholesterol range is 130 to 320 mg/dL    Assessment & Plan:   Problem List Items Addressed This Visit       Unprioritized   Pre-diabetes   Relevant Orders   Hemoglobin A1c   NAFLD (nonalcoholic fatty liver disease)   Hyperlipidemia   Relevant Medications   atorvastatin (LIPITOR) 20 MG tablet   metoprolol succinate (TOPROL-XL) 50 MG 24 hr tablet   lisinopril (ZESTRIL) 20 MG tablet   hydrochlorothiazide (HYDRODIURIL) 25 MG tablet   Other Relevant Orders   Lipid panel   Hepatic function panel   Essential hypertension - Primary   Relevant Medications   atorvastatin (LIPITOR) 20 MG tablet   metoprolol succinate (TOPROL-XL) 50 MG 24 hr tablet   lisinopril (ZESTRIL) 20 MG tablet   hydrochlorothiazide (HYDRODIURIL) 25 MG tablet   Other Relevant Orders   Basic metabolic panel with GFR   Other Visit Diagnoses       Prostate cancer screening       Relevant Orders   PSA, Medicare     Seen for multiple issues today as above.  He has had some mild weight gain since last year.  -Refill several medications for 1 year including atorvastatin, metoprolol, lisinopril, HCTZ. - Apparently has been on metoprolol tartrate once daily switched to metoprolol succinate for better 24 hour coverage - Patient requesting PSA check - Check A1c with prior  history of borderline prediabetes range blood sugars - Try to lose some weight - Obtain several follow-up labs as above  No follow-ups on file.    Evelena Peat, MD

## 2023-10-15 DIAGNOSIS — D044 Carcinoma in situ of skin of scalp and neck: Secondary | ICD-10-CM | POA: Diagnosis not present

## 2023-10-15 DIAGNOSIS — C44619 Basal cell carcinoma of skin of left upper limb, including shoulder: Secondary | ICD-10-CM | POA: Diagnosis not present

## 2023-10-15 NOTE — Progress Notes (Signed)
 Noted.  Samuel Covey MD [facility] at [facility]

## 2023-10-16 DIAGNOSIS — C44619 Basal cell carcinoma of skin of left upper limb, including shoulder: Secondary | ICD-10-CM | POA: Diagnosis not present

## 2023-10-16 DIAGNOSIS — D044 Carcinoma in situ of skin of scalp and neck: Secondary | ICD-10-CM | POA: Diagnosis not present

## 2023-10-17 DIAGNOSIS — D044 Carcinoma in situ of skin of scalp and neck: Secondary | ICD-10-CM | POA: Diagnosis not present

## 2023-10-17 DIAGNOSIS — C44619 Basal cell carcinoma of skin of left upper limb, including shoulder: Secondary | ICD-10-CM | POA: Diagnosis not present

## 2023-10-18 DIAGNOSIS — C44619 Basal cell carcinoma of skin of left upper limb, including shoulder: Secondary | ICD-10-CM | POA: Diagnosis not present

## 2023-10-18 DIAGNOSIS — D044 Carcinoma in situ of skin of scalp and neck: Secondary | ICD-10-CM | POA: Diagnosis not present

## 2023-10-20 DIAGNOSIS — C44619 Basal cell carcinoma of skin of left upper limb, including shoulder: Secondary | ICD-10-CM | POA: Diagnosis not present

## 2023-10-20 DIAGNOSIS — D044 Carcinoma in situ of skin of scalp and neck: Secondary | ICD-10-CM | POA: Diagnosis not present

## 2023-10-23 ENCOUNTER — Other Ambulatory Visit: Payer: Self-pay | Admitting: Podiatry

## 2023-10-23 DIAGNOSIS — D044 Carcinoma in situ of skin of scalp and neck: Secondary | ICD-10-CM | POA: Diagnosis not present

## 2023-10-23 DIAGNOSIS — C44619 Basal cell carcinoma of skin of left upper limb, including shoulder: Secondary | ICD-10-CM | POA: Diagnosis not present

## 2023-10-30 ENCOUNTER — Encounter: Payer: Self-pay | Admitting: Family Medicine

## 2023-10-31 MED ORDER — INDOMETHACIN 25 MG PO CAPS
ORAL_CAPSULE | ORAL | 1 refills | Status: AC
Start: 2023-10-31 — End: ?

## 2023-12-07 DIAGNOSIS — M79671 Pain in right foot: Secondary | ICD-10-CM | POA: Diagnosis not present

## 2023-12-07 DIAGNOSIS — M545 Low back pain, unspecified: Secondary | ICD-10-CM | POA: Diagnosis not present

## 2023-12-26 DIAGNOSIS — D044 Carcinoma in situ of skin of scalp and neck: Secondary | ICD-10-CM | POA: Diagnosis not present

## 2023-12-26 DIAGNOSIS — C44619 Basal cell carcinoma of skin of left upper limb, including shoulder: Secondary | ICD-10-CM | POA: Diagnosis not present

## 2023-12-30 ENCOUNTER — Other Ambulatory Visit: Payer: Self-pay | Admitting: Family Medicine

## 2024-01-03 DIAGNOSIS — M2011 Hallux valgus (acquired), right foot: Secondary | ICD-10-CM | POA: Diagnosis not present

## 2024-01-03 DIAGNOSIS — M19072 Primary osteoarthritis, left ankle and foot: Secondary | ICD-10-CM | POA: Diagnosis not present

## 2024-01-03 DIAGNOSIS — M2032 Hallux varus (acquired), left foot: Secondary | ICD-10-CM | POA: Diagnosis not present

## 2024-01-03 DIAGNOSIS — M2042 Other hammer toe(s) (acquired), left foot: Secondary | ICD-10-CM | POA: Diagnosis not present

## 2024-01-03 DIAGNOSIS — M19071 Primary osteoarthritis, right ankle and foot: Secondary | ICD-10-CM | POA: Diagnosis not present

## 2024-01-08 ENCOUNTER — Encounter: Payer: Self-pay | Admitting: Family Medicine

## 2024-01-09 MED ORDER — LISINOPRIL 20 MG PO TABS: ORAL_TABLET | ORAL | 2 refills | Status: AC

## 2024-01-22 NOTE — Therapy (Signed)
 OUTPATIENT PHYSICAL THERAPY THORACOLUMBAR EVALUATION   Patient Name: Samuel [name] MRN: [id number] DOB:Oct 24, 1950, 73 y.o., male Today's Date: 01/23/2024  END OF SESSION:  PT End of Session - 01/23/24 1811     Visit Number 1    Number of Visits 16    Date for PT Re-Evaluation 04/04/24    Authorization Type mcr    PT Start Time 1617    PT Stop Time 1659    PT Time Calculation (min) 42 min    Activity Tolerance Patient tolerated treatment well    Behavior During Therapy [facility] for tasks assessed/performed          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   severe- right anterior shoulder- (EXC)   Atypical nevus 07/18/2011   mild-mod-right upper abdomen   Back pain    Basal cell carcinoma 07/17/2019   nod-left forearm-(CX35FU)   COVID 01/07/2021   EPISTAXIS, RECURRENT 12/21/2009   ESSENTIAL HYPERTENSION 12/21/2009   Hypertension    Joint pain    Other fatigue    Prediabetes    Retinal detachment    OD   Shortness of breath on exertion    VENTRICULAR HYPERTROPHY, LEFT 01/04/2010   Past Surgical History:  Procedure Laterality Date   CATARACT EXTRACTION Bilateral 2009   EYE SURGERY Bilateral 2009   cataract   GAS/FLUID EXCHANGE Right 09/26/2018   Procedure: Gas/Fluid Exchange;  Surgeon: Valdemar Rogue, MD;  Location: [facility] OR;  Service: Ophthalmology;  Laterality: Right;   PHOTOCOAGULATION WITH LASER Right 09/26/2018   Procedure: Photocoagulation With Laser;  Surgeon: Valdemar Rogue, MD;  Location: [facility] OR;  Service: Ophthalmology;  Laterality: Right;   SCLERAL BUCKLE Right 09/26/2018   Procedure: Scleral Buckle;  Surgeon: Valdemar Rogue, MD;  Location: [facility] OR;  Service: Ophthalmology;  Laterality: Right;   TOTAL HIP ARTHROPLASTY Right 02/16/2022   VITRECTOMY 25 GAUGE WITH SCLERAL BUCKLE Right 09/26/2018   Procedure: VITRECTOMY 25 GAUGE;  Surgeon: Valdemar Rogue, MD;  Location: [facility] OR;   Service: Ophthalmology;  Laterality: Right;   Patient Active Problem List   Diagnosis Date Noted   SOB (shortness of breath) on exertion 12/20/2021   Chronic osteoarthritis, Hip 11/21/2021   Pre-diabetes 11/21/2021   Urinary frequency 11/21/2021   Dehydration, mild 08/11/2021   Class 2 severe obesity with serious comorbidity and body mass index (BMI) of 39.0 to 39.9 in adult (HCC) 07/21/2021   NAFLD (nonalcoholic fatty liver disease) 98/87/7976   Hyperlipidemia 03/01/2021   Prediabetes 07/17/2017   VENTRICULAR HYPERTROPHY, LEFT 01/04/2010   Essential hypertension 12/21/2009   EPISTAXIS, RECURRENT 12/21/2009   ABNORMAL ELECTROCARDIOGRAM 12/21/2009    PCP: Wolm Baize MD  REFERRING PROVIDER: Donaciano Sprang MD  REFERRING DIAG: M54.50 (ICD-10-CM) - Low back pain, unspecified   Rationale for Evaluation and Treatment: Rehabilitation  THERAPY DIAG:  Other low back pain  Abnormal posture  Difficulty in walking, not elsewhere classified  Muscle weakness (generalized)  ONSET DATE:  a few years  SUBJECTIVE:  SUBJECTIVE STATEMENT: Pt reports LBP x a couple of years. Left THR x 2 years ago with good recovery.  I have a right foot issue and may be having surgery.  I still work, my lb is priority for me right now.    PERTINENT HISTORY:  OA feet  PAIN:  Are you having pain? Yes: NPRS scale: current 0/10; worse 4-5/10 Pain location: Lumbar spine through hips, buttocks into le Pain description: burning, ache; tight; radicular pattern into legs  Aggravating factors: walking starts after ~400 ft, lifting; the more I do; first thing in am Relieving factors: resting, heat  PRECAUTIONS: None  RED FLAGS: None   WEIGHT BEARING RESTRICTIONS: No  FALLS:  Has patient fallen in last 6 months?  No  LIVING ENVIRONMENT: Lives with: lives alone Lives in: House/apartment Stairs: Yes: External: 3 steps; wall Has following equipment at home: None  OCCUPATION: Insurance  PLOF: Independent  PATIENT GOALS: decrease pain  NEXT MD VISIT: as needed  OBJECTIVE:  Note: Objective measures were completed at Evaluation unless otherwise noted.  DIAGNOSTIC FINDINGS:  LB Imaging 5/25: degenerative scoliosis as well as advanced degenerative disc disease with severe to space narrowing of all levels of the lumbar spine.   bilateral midfoot osteoarthritis; left hallux varus and right hallux valgus with bilateral 2nd hammertoes   PATIENT SURVEYS:  Modified Oswestry:  MODIFIED OSWESTRY DISABILITY SCALE  Date: 01/23/24 Score  Pain intensity 1 = The pain is bad, but I can manage without having to take (1) I can stand as long as I want but, it increases my pain. pain medication.  2. Personal care (washing, dressing, etc.) 1 =  I can take care of myself normally, but it increases my pain.  3. Lifting 1 = I can lift heavy weights, but it causes increased pain.  4. Walking 2 =  Pain prevents me from walking more than  mile.  5. Sitting 1 =  I can only sit in my favorite chair as long as I like.  6. Standing 3 =  Pain prevents me from standing more than 1/2 hour.  7. Sleeping 0 = Pain does not prevent me from sleeping well.  8. Social Life 2 = Pain prevents me from participating in more energetic activities (eg. sports, dancing).  9. Traveling 0 =  I can travel anywhere without increased pain.  10. Employment/ Homemaking 2 = I can perform most of my homemaking/job duties, but pain prevents me from performing more physically stressful activities (eg, lifting, vacuuming).  Total 11/50=22%   Interpretation of scores: Score Category Description  0-20% Minimal Disability The patient can cope with most living activities. Usually no treatment is indicated apart from advice on lifting, sitting and  exercise  21-40% Moderate Disability The patient experiences more pain and difficulty with sitting, lifting and standing. Travel and social life are more difficult and they may be disabled from work. Personal care, sexual activity and sleeping are not grossly affected, and the patient can usually be managed by conservative means  41-60% Severe Disability Pain remains the main problem in this group, but activities of daily living are affected. These patients require a detailed investigation  61-80% Crippled Back pain impinges on all aspects of the patient's life. Positive intervention is required  81-100% Bed-bound  These patients are either bed-bound or exaggerating their symptoms  Bluford FORBES Zoe DELENA Karon DELENA, et al. Surgery versus conservative management of stable thoracolumbar fracture: the PRESTO feasibility RCT. [place] ([place]): [employer]; [phone]  Nov. (Health Technology Assessment, No. 25.62.) Appendix 3, Oswestry Disability Index category descriptors. Available from: FindJewelers.cz  Minimally Clinically Important Difference (MCID) = 12.8%  COGNITION: Overall cognitive status: {cognition:24006}     SENSATION: WFL  MUSCLE LENGTH: Hamstrings: Right *** deg; Left *** deg   POSTURE: rounded shoulders, forward head, right pelvic obliquity, and weight shift right  PALPATION: No TTP  LUMBAR ROM:   AROM eval  Flexion FT mid shin  Extension Just to neutral  Right lateral flexion 25% limited  Left lateral flexion 75% limited  Right rotation   Left rotation    (Blank rows = not tested)  LOWER EXTREMITY ROM:     wfl  LOWER EXTREMITY MMT:    MMT Right eval Left eval  Hip flexion 31.2 33.3  Hip extension    Hip abduction 17.6 19.6  Hip adduction    Hip internal rotation    Hip external rotation    Knee flexion    Knee extension 47.2 28.3  Ankle dorsiflexion    Ankle plantarflexion    Ankle inversion    Ankle eversion      (Blank rows = not tested)  LUMBAR SPECIAL TESTS:  Straight leg raise test: Negative, Slump test: Negative,  and Trendelenburg sign: Negative  FUNCTIONAL TESTS:  5 times sit to stand: 15.36 TUG: 13.52 4 stage balance test: passed 1&2.  Tandem needs asssitance to gain position holds for 3s; SLS x 5s  GAIT: Distance walked: 400 ft Assistive device utilized: None Level of assistance: Complete Independence Comments: trunk rotation right  TREATMENT  Eval Self care:Posture and body mechanic instruction; use of ad; stair climbing technique; exercise frequency for maximum benefit, returning to walking for exercise.                                                                                                                               PATIENT EDUCATION:  Education details: Discussed eval findings, rehab rationale, aquatic program progression/POC and pools in area. Patient is in agreement  Person educated: Patient Education method: Explanation Education comprehension: verbalized understanding  HOME EXERCISE PROGRAM: TBA  ASSESSMENT:  CLINICAL IMPRESSION: Patient is a 73 y.o. m who was seen today for physical therapy evaluation and treatment for chronic LBP due to Lumbar spondylosis. Patient presents with pain limited deficits in lumbar spine, ROM, endurance, activity tolerance, gait, balance, and functional mobility with ADL's. His pain sensitivity is moderate with activity with radicular pain into LE L>R daily but not continuously aligning with dx. Patient is having to modify amb time/distance to avoid increase in pain symptoms.  His goals are to improve his LB dysfunction in preparation for left foot surgery.  OBJECTIVE IMPAIRMENTS: {opptimpairments:25111}.   ACTIVITY LIMITATIONS: {activitylimitations:27494}  PARTICIPATION LIMITATIONS: {participationrestrictions:25113}  PERSONAL FACTORS: {Personal factors:25162} are also affecting patient's functional outcome.   REHAB  POTENTIAL: Good  CLINICAL DECISION MAKING: Stable/uncomplicated  EVALUATION COMPLEXITY: Low   GOALS: Goals reviewed with patient? Yes  SHORT TERM GOALS: Target date: ***  Pt will tolerate full aquatic sessions consistently without increase in pain and with improving function to demonstrate good toleration and effectiveness of intervention.  Baseline: Goal status: INITIAL  2.  *** Baseline:  Goal status: INITIAL  3.  *** Baseline:  Goal status: INITIAL  4.  *** Baseline:  Goal status: INITIAL  5.  *** Baseline:  Goal status: INITIAL  6.  *** Baseline:  Goal status: INITIAL  LONG TERM GOALS: Target date: 04/04/24  Pt to improve on ODI by 13-15% (MCID) to demonstrate statistically significant Improvement in function. Baseline: 11/50=22% Goal status: INITIAL  2.  Pt will report decrease in pain by at least 50% for improved toleration to activity/quality of life and to demonstrate improved management of pain. Baseline: see chart Goal status: INITIAL  3.  Pt will tolerate stair climbing using alternating pattern ascending and descending 6 steps with use of handrail Baseline: step to Goal status: INITIAL  4.  Pt will report toleration to community amb without limitation to pain Baseline:  Goal status: INITIAL  5.  *** Baseline:  Goal status: INITIAL  6.  *** Baseline:  Goal status: INITIAL  PLAN:  PT FREQUENCY: 1-2x/week  PT DURATION: 10 weeks extended out 2 weeks due to scheduling conflicts; 16 visits  PLANNED INTERVENTIONS: 97164- PT Re-evaluation, 97750- Physical Performance Testing, 97110-Therapeutic exercises, 97530- Therapeutic activity, [id number]- Neuromuscular re-education, 97535- Self Care, 02859- Manual therapy, [id number]- Gait training, [id number]- Aquatic Therapy, [id number]- Electrical stimulation (manual), [id number]- Traction (mechanical), 20560 (1-2 muscles), 20561 (3+ muscles)- Dry Needling, Patient/Family education, Balance training, Stair training, Taping,  Joint mobilization, DME instructions, Cryotherapy, and Moist heat.  PLAN FOR NEXT SESSION: Aquatic and land   Ronal Moscow) Theta Leaf MPT 01/23/24 6:14 PM [facility] [address] [place], [place], [address] Phone: [phone]   Fax:  [phone]

## 2024-01-23 ENCOUNTER — Encounter (HOSPITAL_BASED_OUTPATIENT_CLINIC_OR_DEPARTMENT_OTHER): Payer: Self-pay | Admitting: Physical Therapy

## 2024-01-23 ENCOUNTER — Other Ambulatory Visit: Payer: Self-pay

## 2024-01-23 ENCOUNTER — Ambulatory Visit (HOSPITAL_BASED_OUTPATIENT_CLINIC_OR_DEPARTMENT_OTHER): Attending: Orthopedic Surgery | Admitting: Physical Therapy

## 2024-01-23 DIAGNOSIS — M5459 Other low back pain: Secondary | ICD-10-CM | POA: Diagnosis not present

## 2024-01-23 DIAGNOSIS — R262 Difficulty in walking, not elsewhere classified: Secondary | ICD-10-CM | POA: Diagnosis not present

## 2024-01-23 DIAGNOSIS — R293 Abnormal posture: Secondary | ICD-10-CM | POA: Diagnosis not present

## 2024-01-23 DIAGNOSIS — M6281 Muscle weakness (generalized): Secondary | ICD-10-CM | POA: Insufficient documentation

## 2024-01-25 ENCOUNTER — Ambulatory Visit (HOSPITAL_BASED_OUTPATIENT_CLINIC_OR_DEPARTMENT_OTHER): Admitting: Physical Therapy

## 2024-01-25 ENCOUNTER — Encounter (HOSPITAL_BASED_OUTPATIENT_CLINIC_OR_DEPARTMENT_OTHER): Payer: Self-pay | Admitting: Physical Therapy

## 2024-01-25 DIAGNOSIS — R293 Abnormal posture: Secondary | ICD-10-CM

## 2024-01-25 DIAGNOSIS — M5459 Other low back pain: Secondary | ICD-10-CM

## 2024-01-25 DIAGNOSIS — M6281 Muscle weakness (generalized): Secondary | ICD-10-CM

## 2024-01-25 DIAGNOSIS — R262 Difficulty in walking, not elsewhere classified: Secondary | ICD-10-CM | POA: Diagnosis not present

## 2024-01-25 NOTE — Therapy (Signed)
 OUTPATIENT PHYSICAL THERAPY THORACOLUMBAR TREATMENT   Patient Name: Samuel [name] MRN: [id number] DOB:July 28, 1950, 73 y.o., male Today's Date: 01/25/2024  END OF SESSION:  PT End of Session - 01/25/24 1344     Visit Number 2    Number of Visits 16    Date for PT Re-Evaluation 04/04/24    Authorization Type mcr    PT Start Time 1345    PT Stop Time 1423    PT Time Calculation (min) 38 min    Activity Tolerance Patient tolerated treatment well    Behavior During Therapy WFL for tasks assessed/performed          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   severe- right anterior shoulder- (EXC)   Atypical nevus 07/18/2011   mild-mod-right upper abdomen   Back pain    Basal cell carcinoma 07/17/2019   nod-left forearm-([id number])   COVID 01/07/2021   EPISTAXIS, RECURRENT 12/21/2009   ESSENTIAL HYPERTENSION 12/21/2009   Hypertension    Joint pain    Other fatigue    Prediabetes    Retinal detachment    OD   Shortness of breath on exertion    VENTRICULAR HYPERTROPHY, LEFT 01/04/2010   Past Surgical History:  Procedure Laterality Date   CATARACT EXTRACTION Bilateral 2009   EYE SURGERY Bilateral 2009   cataract   GAS/FLUID EXCHANGE Right 09/26/2018   Procedure: Gas/Fluid Exchange;  Surgeon: Valdemar Rogue, MD;  Location[facility] OR;  Service: Ophthalmology;  Laterality: Right;   PHOTOCOAGULATION WITH LASER Right 09/26/2018   Procedure: Photocoagulation With Laser;  Surgeon: Valdemar Rogue, MD;  Location: [facility] OR;  Service: Ophthalmology;  Laterality: Right;   SCLERAL BUCKLE Right 09/26/2018   Procedure: Scleral Buckle;  Surgeon: Valdemar Rogue, MD;  Location: [facility] OR;  Service: Ophthalmology;  Laterality: Right;   TOTAL HIP ARTHROPLASTY Right 02/16/2022   VITRECTOMY 25 GAUGE WITH SCLERAL BUCKLE Right 09/26/2018   Procedure: VITRECTOMY 25 GAUGE;  Surgeon: Valdemar Rogue, MD;  Location: [facility] OR;   Service: Ophthalmology;  Laterality: Right;   Patient Active Problem List   Diagnosis Date Noted   SOB (shortness of breath) on exertion 12/20/2021   Chronic osteoarthritis, Hip 11/21/2021   Pre-diabetes 11/21/2021   Urinary frequency 11/21/2021   Dehydration, mild 08/11/2021   Class 2 severe obesity with serious comorbidity and body mass index (BMI) of 39.0 to 39.9 in adult (HCC) 07/21/2021   NAFLD (nonalcoholic fatty liver disease) 98/87/7976   Hyperlipidemia 03/01/2021   Prediabetes 07/17/2017   VENTRICULAR HYPERTROPHY, LEFT 01/04/2010   Essential hypertension 12/21/2009   EPISTAXIS, RECURRENT 12/21/2009   ABNORMAL ELECTROCARDIOGRAM 12/21/2009    PCP: Wolm Baize MD  REFERRING PROVIDER: Donaciano Sprang MD  REFERRING DIAG: M54.50 (ICD-10-CM) - Low back pain, unspecified   Rationale for Evaluation and Treatment: Rehabilitation  THERAPY DIAG:  Other low back pain  Abnormal posture  Difficulty in walking, not elsewhere classified  Muscle weakness (generalized)  ONSET DATE:  a few years  SUBJECTIVE:  SUBJECTIVE STATEMENT: Pt reports no changes since evaluation.  He reports he knows how to swim; hasn't been in pool in a while.    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    From initial evaluation:  Pt reports LBP x a couple of years. Left THR x 2 years ago with good recovery.  I have a right foot issue and may be having surgery.  I still work, my lb is priority for me right now.    PERTINENT HISTORY:  OA feet  PAIN:  Are you having pain? Yes: NPRS scale: current 1/10 (took naproxen 2 hrs prior to session)  Pain location: Lumbar spine through hips, buttocks Pain description: burning, ache; tight; radicular pattern into legs  Aggravating factors: walking  starts after ~400 ft, lifting; the more I do; first thing in am Relieving factors: resting, heat  PRECAUTIONS: None  RED FLAGS: None   WEIGHT BEARING RESTRICTIONS: No  FALLS:  Has patient fallen in last 6 months? No  LIVING ENVIRONMENT: Lives with: lives alone Lives in: House/apartment Stairs: Yes: External: 3 steps; wall Has following equipment at home: None  OCCUPATION: Insurance  PLOF: Independent  PATIENT GOALS: decrease pain  NEXT MD VISIT: as needed  OBJECTIVE:  Note: Objective measures were completed at Evaluation unless otherwise noted.  DIAGNOSTIC FINDINGS:  LB Imaging 5/25: degenerative scoliosis as well as advanced degenerative disc disease with severe to space narrowing of all levels of the lumbar spine.   bilateral midfoot osteoarthritis; left hallux varus and right hallux valgus with bilateral 2nd hammertoes   PATIENT SURVEYS:  Modified Oswestry:  MODIFIED OSWESTRY DISABILITY SCALE  Date: 01/23/24 Score  Pain intensity 1 = The pain is bad, but I can manage without having to take (1) I can stand as long as I want but, it increases my pain. pain medication.  2. Personal care (washing, dressing, etc.) 1 =  I can take care of myself normally, but it increases my pain.  3. Lifting 1 = I can lift heavy weights, but it causes increased pain.  4. Walking 2 =  Pain prevents me from walking more than  mile.  5. Sitting 1 =  I can only sit in my favorite chair as long as I like.  6. Standing 3 =  Pain prevents me from standing more than 1/2 hour.  7. Sleeping 0 = Pain does not prevent me from sleeping well.  8. Social Life 2 = Pain prevents me from participating in more energetic activities (eg. sports, dancing).  9. Traveling 0 =  I can travel anywhere without increased pain.  10. Employment/ Homemaking 2 = I can perform most of my homemaking/job duties, but pain prevents me from performing more physically stressful activities (eg, lifting, vacuuming).  Total  11/50=22%   Interpretation of scores: Score Category Description  0-20% Minimal Disability The patient can cope with most living activities. Usually no treatment is indicated apart from advice on lifting, sitting and exercise  21-40% Moderate Disability The patient experiences more pain and difficulty with sitting, lifting and standing. Travel and social life are more difficult and they may be disabled from work. Personal care, sexual activity and sleeping are not grossly affected, and the patient can usually be managed by conservative means  41-60% Severe Disability Pain remains the main problem in this group, but activities of daily living are affected. These patients require a detailed investigation  61-80% Crippled Back pain impinges on all aspects of  the patient's life. Positive intervention is required  81-100% Bed-bound  These patients are either bed-bound or exaggerating their symptoms  Bluford FORBES Zoe DELENA Karon DELENA, et al. Surgery versus conservative management of stable thoracolumbar fracture: the PRESTO feasibility RCT. [place] ([place]): [employer]; 2021 Nov. [facility] Technology Assessment, No. 25.62.) Appendix 3, Oswestry Disability Index category descriptors. Available from: [employer]cz  Minimally Clinically Important Difference (MCID) = 12.8%  COGNITION: Overall cognitive status: Within functional limits for tasks assessed     SENSATION: WFL   POSTURE: rounded shoulders, forward head, right pelvic obliquity, and weight shift right  PALPATION: No TTP  LUMBAR ROM:   AROM eval  Flexion FT mid shin  Extension Just to neutral  Right lateral flexion 25% limited  Left lateral flexion 75% limited  Right rotation   Left rotation    (Blank rows = not tested)  LOWER EXTREMITY ROM:     wfl  LOWER EXTREMITY MMT:    MMT Right eval Left eval  Hip flexion 31.2 33.3  Hip extension    Hip abduction 17.6 19.6  Hip adduction     Hip internal rotation    Hip external rotation    Knee flexion    Knee extension 47.2 28.3  Ankle dorsiflexion    Ankle plantarflexion    Ankle inversion    Ankle eversion     (Blank rows = not tested)  LUMBAR SPECIAL TESTS:  Straight leg raise test: Negative, Slump test: Negative,  and Trendelenburg sign: Negative  FUNCTIONAL TESTS:  5 times sit to stand: 15.36 TUG: 13.52 4 stage balance test: passed 1&2.  Tandem needs asssitance to gain position holds for 3s; SLS x 5s  GAIT: Distance walked: 400 ft Assistive device utilized: None Level of assistance: Complete Independence Comments: trunk rotation right  TREATMENT  [facility] Adult PT Treatment:                                             Date: 01/25/24 Pt seen for aquatic therapy today.  Treatment took place in water  3.5-4.75 ft in depth at the [facility] pool. Temp of water  was 91.  Pt entered/exited the pool via stairs independently in step-to pattern (using RLE) with bil rail.  - Intro to aquatic therapy principles - unsupported-> UE on blue hand floats walking forward/ backward - cues for even step length, neutral Lt foot - UE on rainbow hand floats: side stepping, cues for increased step height, -> with arm add/abdct with rainbow hand floats - UE on wall:  relaxed squat x 20sec; heel raises x 10; hip add/abd 2x10 ; hip extension x 10;  - TrA set with 1/2 hollow noodle pull down to thighs x 10  - gentle trunk rotation with UE on noodle - straddling noodle with no UE support: cycling (difficulty attaining position due to height and limited depth in pool) - Rt/Lt hamstring stretch with straight back, foot on 3rd step, 15sec x 2 each   Pt requires the buoyancy and hydrostatic pressure of water  for support, and to offload joints by unweighting joint load by at least 50 % in navel deep water  and by at least 75-80% in chest to neck deep water .  Viscosity of the water  is needed for resistance of strengthening. Water  current  perturbations provides challenge to standing balance requiring increased core activation.    PATIENT EDUCATION:  Education details: intro to aquatic therapy Person educated: Patient Education method: Explanation Education comprehension: verbalized understanding  HOME EXERCISE PROGRAM: TBA  ASSESSMENT:  CLINICAL IMPRESSION: Pt demonstrates safety and independence in aquatic setting with therapist instructing from deck. Pt demonstrates confidence in setting, moving throughout all depths easily. Pt requires minor cues for even step length, vertical trunk and neutral Lt foot (vs turned out).He reported some tightening of lower back after entry into water ; limited relief with relaxed squat.  May benefit from strengthening LLE with forward step ups while submerged.   Goals are ongoing.       From initial evaluation:  Patient is a 73 y.o. m who was seen today for physical therapy evaluation and treatment for chronic LBP due to Lumbar spondylosis. Patient presents with pain limited deficits in lumbar spine, ROM, endurance, activity tolerance, gait, balance, and functional mobility with ADL's. His pain sensitivity is moderate with activity with radicular pain into LE L>R daily but not continuously aligning with dx. Patient is having to modify amb time/distance to avoid increase in pain symptoms.  His goals are to improve his LB dysfunction in preparation for left foot surgery.  OBJECTIVE IMPAIRMENTS: Abnormal gait, decreased activity tolerance, decreased mobility, difficulty walking, decreased ROM, decreased strength, postural dysfunction, obesity, and pain.   ACTIVITY LIMITATIONS: carrying, lifting, sitting, standing, squatting, stairs, transfers, and locomotion level  PARTICIPATION LIMITATIONS: cleaning, community activity, and yard work  PERSONAL FACTORS: Fitness and Time since onset of injury/illness/exacerbation are also affecting patient's functional outcome.   REHAB POTENTIAL:  Good  CLINICAL DECISION MAKING: Stable/uncomplicated  EVALUATION COMPLEXITY: Low   GOALS: Goals reviewed with patient? Yes  SHORT TERM GOALS: Target date: 02/22/24  Pt will tolerate full aquatic sessions consistently without increase in pain and with improving function to demonstrate good toleration and effectiveness of intervention.  Baseline: Goal status: INITIAL  2.  Pt will improve lumbar ROM Baseline: see chart Goal status: INITIAL   LONG TERM GOALS: Target date: 04/04/24  Pt to improve on ODI by 13-15% (MCID) to demonstrate statistically significant Improvement in function. Baseline: 11/50=22% Goal status: INITIAL  2.  Pt will report decrease in pain by at least 50% for improved toleration to activity/quality of life and to demonstrate improved management of pain. Baseline: see chart Goal status: INITIAL  3.  Pt will tolerate stair climbing using alternating pattern ascending and descending 6 steps with use of handrail Baseline: step to Goal status: INITIAL  4.  Pt will report toleration to community amb without limitation to pain Baseline:  Goal status: INITIAL  5.  Pt will improve on 5 X STS test to <or=11 to demonstrate improving functional lower extremity strength, transitional movements, and balance. (MDC = 4.2sec)  Baseline: 15.36 Goal status: INITIAL  6.  Pt will be indep with final HEP's (land and aquatic as appropriate) for continued management of condition Baseline:  Goal status: INITIAL  PLAN:  PT FREQUENCY: 1-2x/week  PT DURATION: 10 weeks extended out 2 weeks due to scheduling conflicts; 16 visits  PLANNED INTERVENTIONS: 97164- PT Re-evaluation, 97750- Physical Performance Testing, 97110-Therapeutic exercises, 97530- Therapeutic activity, [id number]- Neuromuscular re-education, 97535- Self Care, [id number]- Manual therapy, [id number]- Gait training, [phone]- Aquatic Therapy, [phone]- Electrical stimulation (manual), [id number]- Traction (mechanical), 20560 (1-2 muscles),  20561 (3+ muscles)- Dry Needling, Patient/Family education, Balance training, Stair training, Taping, Joint mobilization, DME instructions, Cryotherapy, and Moist heat.  PLAN FOR NEXT SESSION: Aquatic and land: core and LE strengthening and stretching; posture realignment, pain management; gait, balance and  proprioception re-training.  Delon Aquas, PTA 01/25/24 3:22 PM [facility] [address] [place], [place], [address] Phone: [phone]   Fax:  [phone]

## 2024-01-29 ENCOUNTER — Encounter (HOSPITAL_BASED_OUTPATIENT_CLINIC_OR_DEPARTMENT_OTHER): Payer: Self-pay | Admitting: Physical Therapy

## 2024-01-29 ENCOUNTER — Ambulatory Visit (HOSPITAL_BASED_OUTPATIENT_CLINIC_OR_DEPARTMENT_OTHER): Admitting: Physical Therapy

## 2024-01-29 DIAGNOSIS — R262 Difficulty in walking, not elsewhere classified: Secondary | ICD-10-CM | POA: Diagnosis not present

## 2024-01-29 DIAGNOSIS — M6281 Muscle weakness (generalized): Secondary | ICD-10-CM

## 2024-01-29 DIAGNOSIS — M5459 Other low back pain: Secondary | ICD-10-CM

## 2024-01-29 DIAGNOSIS — R293 Abnormal posture: Secondary | ICD-10-CM | POA: Diagnosis not present

## 2024-01-29 NOTE — Therapy (Signed)
 OUTPATIENT PHYSICAL THERAPY THORACOLUMBAR TREATMENT   Patient Name: Samuel [name] MRN: [id number] DOB:12-May-1951, 73 y.o., male Today's Date: 01/29/2024  END OF SESSION:  PT End of Session - 01/29/24 1630     Visit Number 3    Number of Visits 16    Date for PT Re-Evaluation 04/04/24    Authorization Type MCR    PT Start Time 1615    PT Stop Time 1653    PT Time Calculation (min) 38 min    Activity Tolerance Patient tolerated treatment well    Behavior During Therapy [facility] for tasks assessed/performed          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   severe- right anterior shoulder- (EXC)   Atypical nevus 07/18/2011   mild-mod-right upper abdomen   Back pain    Basal cell carcinoma 07/17/2019   nod-left forearm-([id number])   COVID 01/07/2021   EPISTAXIS, RECURRENT 12/21/2009   ESSENTIAL HYPERTENSION 12/21/2009   Hypertension    Joint pain    Other fatigue    Prediabetes    Retinal detachment    OD   Shortness of breath on exertion    VENTRICULAR HYPERTROPHY, LEFT 01/04/2010   Past Surgical History:  Procedure Laterality Date   CATARACT EXTRACTION Bilateral 2009   EYE SURGERY Bilateral 2009   cataract   GAS/FLUID EXCHANGE Right 09/26/2018   Procedure: Gas/Fluid Exchange;  Surgeon: Valdemar Rogue, MD;  Location: [facility] OR;  Service: Ophthalmology;  Laterality: Right;   PHOTOCOAGULATION WITH LASER Right 09/26/2018   Procedure: Photocoagulation With Laser;  Surgeon: Valdemar Rogue, MD;  Location: [facility] OR;  Service: Ophthalmology;  Laterality: Right;   SCLERAL BUCKLE Right 09/26/2018   Procedure: Scleral Buckle;  Surgeon: Valdemar Rogue, MD;  Location: [facility] OR;  Service: Ophthalmology;  Laterality: Right;   TOTAL HIP ARTHROPLASTY Right 02/16/2022   VITRECTOMY 25 GAUGE WITH SCLERAL BUCKLE Right 09/26/2018   Procedure: VITRECTOMY 25 GAUGE;  Surgeon: Valdemar Rogue, MD;  Location: [facility] OR;   Service: Ophthalmology;  Laterality: Right;   Patient Active Problem List   Diagnosis Date Noted   SOB (shortness of breath) on exertion 12/20/2021   Chronic osteoarthritis, Hip 11/21/2021   Pre-diabetes 11/21/2021   Urinary frequency 11/21/2021   Dehydration, mild 08/11/2021   Class 2 severe obesity with serious comorbidity and body mass index (BMI) of 39.0 to 39.9 in adult (HCC) 07/21/2021   NAFLD (nonalcoholic fatty liver disease) 98/87/7976   Hyperlipidemia 03/01/2021   Prediabetes 07/17/2017   VENTRICULAR HYPERTROPHY, LEFT 01/04/2010   Essential hypertension 12/21/2009   EPISTAXIS, RECURRENT 12/21/2009   ABNORMAL ELECTROCARDIOGRAM 12/21/2009    PCP: Wolm Baize MD  REFERRING PROVIDER: Donaciano Sprang MD  REFERRING DIAG: M54.50 (ICD-10-CM) - Low back pain, unspecified   Rationale for Evaluation and Treatment: Rehabilitation  THERAPY DIAG:  Other low back pain  Abnormal posture  Difficulty in walking, not elsewhere classified  Muscle weakness (generalized)  ONSET DATE:  a few years  SUBJECTIVE:  SUBJECTIVE STATEMENT: Pt reports he had some relief for a few hours and the soreness in back and LE returned.     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    From initial evaluation:  Pt reports LBP x a couple of years. Left THR x 2 years ago with good recovery.  I have a right foot issue and may be having surgery.  I still work, my lb is priority for me right now.    PERTINENT HISTORY:  OA feet  PAIN:  Are you having pain? Yes: NPRS scale: current 3/10  Pain location: Lumbar spine through hips, buttocks to back of legs Pain description: burning, ache; tight; radicular pattern into legs  Aggravating factors: walking starts after ~400 ft, lifting; the more I  do; first thing in am Relieving factors: resting, heat  PRECAUTIONS: None  RED FLAGS: None   WEIGHT BEARING RESTRICTIONS: No  FALLS:  Has patient fallen in last 6 months? No  LIVING ENVIRONMENT: Lives with: lives alone Lives in: House/apartment Stairs: Yes: External: 3 steps; wall Has following equipment at home: None  OCCUPATION: Insurance  PLOF: Independent  PATIENT GOALS: decrease pain  NEXT MD VISIT: as needed  OBJECTIVE:  Note: Objective measures were completed at Evaluation unless otherwise noted.  DIAGNOSTIC FINDINGS:  LB Imaging 5/25: degenerative scoliosis as well as advanced degenerative disc disease with severe to space narrowing of all levels of the lumbar spine.   bilateral midfoot osteoarthritis; left hallux varus and right hallux valgus with bilateral 2nd hammertoes   PATIENT SURVEYS:  Modified Oswestry:  MODIFIED OSWESTRY DISABILITY SCALE  Date: 01/23/24 Score  Pain intensity 1 = The pain is bad, but I can manage without having to take (1) I can stand as long as I want but, it increases my pain. pain medication.  2. Personal care (washing, dressing, etc.) 1 =  I can take care of myself normally, but it increases my pain.  3. Lifting 1 = I can lift heavy weights, but it causes increased pain.  4. Walking 2 =  Pain prevents me from walking more than  mile.  5. Sitting 1 =  I can only sit in my favorite chair as long as I like.  6. Standing 3 =  Pain prevents me from standing more than 1/2 hour.  7. Sleeping 0 = Pain does not prevent me from sleeping well.  8. Social Life 2 = Pain prevents me from participating in more energetic activities (eg. sports, dancing).  9. Traveling 0 =  I can travel anywhere without increased pain.  10. Employment/ Homemaking 2 = I can perform most of my homemaking/job duties, but pain prevents me from performing more physically stressful activities (eg, lifting, vacuuming).  Total 11/50=22%   Interpretation of scores: Score  Category Description  0-20% Minimal Disability The patient can cope with most living activities. Usually no treatment is indicated apart from advice on lifting, sitting and exercise  21-40% Moderate Disability The patient experiences more pain and difficulty with sitting, lifting and standing. Travel and social life are more difficult and they may be disabled from work. Personal care, sexual activity and sleeping are not grossly affected, and the patient can usually be managed by conservative means  41-60% Severe Disability Pain remains the main problem in this group, but activities of daily living are affected. These patients require a detailed investigation  61-80% Crippled Back pain impinges on all aspects of the patient's life. Positive intervention  is required  81-100% Bed-bound  These patients are either bed-bound or exaggerating their symptoms  Bluford FORBES Zoe DELENA Karon DELENA, et al. Surgery versus conservative management of stable thoracolumbar fracture: the PRESTO feasibility RCT. [place]): [employer]; 2021 Nov. [facility] Technology Assessment, No. 25.62.) Appendix 3, Oswestry Disability Index category descriptors. Available from: [employer]el[employer]cz  Minimally Clinically Important Difference (MCID) = 12.8%  COGNITION: Overall cognitive status: Within functional limits for tasks assessed     SENSATION: WFL   POSTURE: rounded shoulders, forward head, right pelvic obliquity, and weight shift right  PALPATION: No TTP  LUMBAR ROM:   AROM eval  Flexion FT mid shin  Extension Just to neutral  Right lateral flexion 25% limited  Left lateral flexion 75% limited  Right rotation   Left rotation    (Blank rows = not tested)  LOWER EXTREMITY ROM:     wfl  LOWER EXTREMITY MMT:    MMT Right eval Left eval  Hip flexion 31.2 33.3  Hip extension    Hip abduction 17.6 19.6  Hip adduction    Hip internal rotation    Hip external rotation     Knee flexion    Knee extension 47.2 28.3  Ankle dorsiflexion    Ankle plantarflexion    Ankle inversion    Ankle eversion     (Blank rows = not tested)  LUMBAR SPECIAL TESTS:  Straight leg raise test: Negative, Slump test: Negative,  and Trendelenburg sign: Negative  FUNCTIONAL TESTS:  5 times sit to stand: 15.36 TUG: 13.52 4 stage balance test: passed 1&2.  Tandem needs asssitance to gain position holds for 3s; SLS x 5s  GAIT: Distance walked: 400 ft Assistive device utilized: None Level of assistance: Complete Independence Comments: trunk rotation right  TREATMENT  [facility] Adult PT Treatment:                                             Date: 01/25/24 Pt seen for aquatic therapy today.  Treatment took place in water  3.5-4.75 ft in depth at the [facility] pool. Temp of water  was 91.  Pt entered/exited the pool via stairs independently in step-to pattern (using RLE) with bil rail.  - unsupported walking forward/ backward - cues for even step length, - side stepping with arm add/abdct with yellow hand floats x 2 laps - UE on wall: heel raises x 10; hip add/abd 2x10 ; leg swings into hip flex/extension x 10; squats  - TrA set with full hollow noodle pull down to thighs x 10 in each wide stance and staggered stance -> solid noodle x 10 -> knee taps with rainbow hand floats with marching forward / backward  - gentle trunk rotation with UE on hand floats  - plank with hands on bench in water  -> with hip ext x 10 each LE; fire hydrants x 5 each LE - Rt/Lt quad stretch with foot on 2nd step, 15sec each   Pt requires the buoyancy and hydrostatic pressure of water  for support, and to offload joints by unweighting joint load by at least 50 % in navel deep water  and by at least 75-80% in chest to neck deep water .  Viscosity of the water  is needed for resistance of strengthening. Water  current perturbations provides challenge to standing balance requiring increased core activation.     PATIENT EDUCATION:  Education details: intro  to aquatic therapy Person educated: Patient Education method: Explanation Education comprehension: verbalized understanding  HOME EXERCISE PROGRAM: TBA  ASSESSMENT:  CLINICAL IMPRESSION: Pt requires minor cues for vertical trunk and core engaged. He tolerated progression of exercises well with gradual reduction of pain by 1 point. Some relief reported in plank position in water .   May benefit from strengthening LLE with forward step ups while submerged and Rt hip stretches.   Goals are ongoing.       From initial evaluation:  Patient is a 73 y.o. m who was seen today for physical therapy evaluation and treatment for chronic LBP due to Lumbar spondylosis. Patient presents with pain limited deficits in lumbar spine, ROM, endurance, activity tolerance, gait, balance, and functional mobility with ADL's. His pain sensitivity is moderate with activity with radicular pain into LE L>R daily but not continuously aligning with dx. Patient is having to modify amb time/distance to avoid increase in pain symptoms.  His goals are to improve his LB dysfunction in preparation for left foot surgery.  OBJECTIVE IMPAIRMENTS: Abnormal gait, decreased activity tolerance, decreased mobility, difficulty walking, decreased ROM, decreased strength, postural dysfunction, obesity, and pain.   ACTIVITY LIMITATIONS: carrying, lifting, sitting, standing, squatting, stairs, transfers, and locomotion level  PARTICIPATION LIMITATIONS: cleaning, community activity, and yard work  PERSONAL FACTORS: Fitness and Time since onset of injury/illness/exacerbation are also affecting patient's functional outcome.   REHAB POTENTIAL: Good  CLINICAL DECISION MAKING: Stable/uncomplicated  EVALUATION COMPLEXITY: Low   GOALS: Goals reviewed with patient? Yes  SHORT TERM GOALS: Target date: 02/22/24  Pt will tolerate full aquatic sessions consistently without increase in pain  and with improving function to demonstrate good toleration and effectiveness of intervention.  Baseline: Goal status: INITIAL  2.  Pt will improve lumbar ROM Baseline: see chart Goal status: INITIAL   LONG TERM GOALS: Target date: 04/04/24  Pt to improve on ODI by 13-15% (MCID) to demonstrate statistically significant Improvement in function. Baseline: 11/50=22% Goal status: INITIAL  2.  Pt will report decrease in pain by at least 50% for improved toleration to activity/quality of life and to demonstrate improved management of pain. Baseline: see chart Goal status: INITIAL  3.  Pt will tolerate stair climbing using alternating pattern ascending and descending 6 steps with use of handrail Baseline: step to Goal status: INITIAL  4.  Pt will report toleration to community amb without limitation to pain Baseline:  Goal status: INITIAL  5.  Pt will improve on 5 X STS test to <or=11 to demonstrate improving functional lower extremity strength, transitional movements, and balance. (MDC = 4.2sec)  Baseline: 15.36 Goal status: INITIAL  6.  Pt will be indep with final HEP's (land and aquatic as appropriate) for continued management of condition Baseline:  Goal status: INITIAL  PLAN:  PT FREQUENCY: 1-2x/week  PT DURATION: 10 weeks extended out 2 weeks due to scheduling conflicts; 16 visits  PLANNED INTERVENTIONS: 97164- PT Re-evaluation, 97750- Physical Performance Testing, 97110-Therapeutic exercises, 97530- Therapeutic activity, [id number]- Neuromuscular re-education, 97535- Self Care, [id number]- Manual therapy, [id number]- Gait training, [phone]- Aquatic Therapy, [phone]- Electrical stimulation (manual), [id number]- Traction (mechanical), 20560 (1-2 muscles), 20561 (3+ muscles)- Dry Needling, Patient/Family education, Balance training, Stair training, Taping, Joint mobilization, DME instructions, Cryotherapy, and Moist heat.  PLAN FOR NEXT SESSION: Aquatic and land: core and LE strengthening and  stretching; posture realignment, pain management; gait, balance and proprioception re-training.  Delon Aquas, PTA 01/29/24 5:02 PM [facility] [address] [place], [place],  [address] Phone: [phone]   Fax:  [phone]

## 2024-01-30 ENCOUNTER — Other Ambulatory Visit: Payer: Self-pay | Admitting: Family Medicine

## 2024-02-01 ENCOUNTER — Encounter (HOSPITAL_BASED_OUTPATIENT_CLINIC_OR_DEPARTMENT_OTHER): Payer: Self-pay | Admitting: Physical Therapy

## 2024-02-01 ENCOUNTER — Ambulatory Visit (HOSPITAL_BASED_OUTPATIENT_CLINIC_OR_DEPARTMENT_OTHER): Admitting: Physical Therapy

## 2024-02-01 DIAGNOSIS — R262 Difficulty in walking, not elsewhere classified: Secondary | ICD-10-CM | POA: Diagnosis not present

## 2024-02-01 DIAGNOSIS — R293 Abnormal posture: Secondary | ICD-10-CM | POA: Diagnosis not present

## 2024-02-01 DIAGNOSIS — M6281 Muscle weakness (generalized): Secondary | ICD-10-CM

## 2024-02-01 DIAGNOSIS — M5459 Other low back pain: Secondary | ICD-10-CM | POA: Diagnosis not present

## 2024-02-01 NOTE — Therapy (Signed)
 OUTPATIENT PHYSICAL THERAPY THORACOLUMBAR TREATMENT   Patient Name: Samuel [name] MRN: [id number] DOB:1950/07/23, 73 y.o., male Today's Date: 02/01/2024  END OF SESSION:  PT End of Session - 02/01/24 1606     Visit Number 4    Number of Visits 16    Date for PT Re-Evaluation 04/04/24    Authorization Type MCR    PT Start Time 1600    PT Stop Time 1640    PT Time Calculation (min) 40 min    Activity Tolerance Patient tolerated treatment well    Behavior During Therapy [facility] for tasks assessed/performed          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   severe- right anterior shoulder- (EXC)   Atypical nevus 07/18/2011   mild-mod-right upper abdomen   Back pain    Basal cell carcinoma 07/17/2019   nod-left forearm-([id number])   COVID 01/07/2021   EPISTAXIS, RECURRENT 12/21/2009   ESSENTIAL HYPERTENSION 12/21/2009   Hypertension    Joint pain    Other fatigue    Prediabetes    Retinal detachment    OD   Shortness of breath on exertion    VENTRICULAR HYPERTROPHY, LEFT 01/04/2010   Past Surgical History:  Procedure Laterality Date   CATARACT EXTRACTION Bilateral 2009   EYE SURGERY Bilateral 2009   cataract   GAS/FLUID EXCHANGE Right 09/26/2018   Procedure: Gas/Fluid Exchange;  Surgeon: Valdemar Rogue, MD;  Location: [facility] OR;  Service: Ophthalmology;  Laterality: Right;   PHOTOCOAGULATION WITH LASER Right 09/26/2018   Procedure: Photocoagulation With Laser;  Surgeon: Valdemar Rogue, MD;  Location: [facility] OR;  Service: Ophthalmology;  Laterality: Right;   SCLERAL BUCKLE Right 09/26/2018   Procedure: Scleral Buckle;  Surgeon: Valdemar Rogue, MD;  Location: [facility] OR;  Service: Ophthalmology;  Laterality: Right;   TOTAL HIP ARTHROPLASTY Right 02/16/2022   VITRECTOMY 25 GAUGE WITH SCLERAL BUCKLE Right 09/26/2018   Procedure: VITRECTOMY 25 GAUGE;  Surgeon: Valdemar Rogue, MD;  Location: [facility] OR;   Service: Ophthalmology;  Laterality: Right;   Patient Active Problem List   Diagnosis Date Noted   SOB (shortness of breath) on exertion 12/20/2021   Chronic osteoarthritis, Hip 11/21/2021   Pre-diabetes 11/21/2021   Urinary frequency 11/21/2021   Dehydration, mild 08/11/2021   Class 2 severe obesity with serious comorbidity and body mass index (BMI) of 39.0 to 39.9 in adult (HCC) 07/21/2021   NAFLD (nonalcoholic fatty liver disease) 98/87/7976   Hyperlipidemia 03/01/2021   Prediabetes 07/17/2017   VENTRICULAR HYPERTROPHY, LEFT 01/04/2010   Essential hypertension 12/21/2009   EPISTAXIS, RECURRENT 12/21/2009   ABNORMAL ELECTROCARDIOGRAM 12/21/2009    PCP: Wolm Baize MD  REFERRING PROVIDER: Donaciano Sprang MD  REFERRING DIAG: M54.50 (ICD-10-CM) - Low back pain, unspecified   Rationale for Evaluation and Treatment: Rehabilitation  THERAPY DIAG:  Other low back pain  Abnormal posture  Difficulty in walking, not elsewhere classified  Muscle weakness (generalized)  ONSET DATE:  a few years  SUBJECTIVE:  SUBJECTIVE STATEMENT: Pt reports he was not as sore as after the first session. He has not gotten into pool outside therapy session yet; plans to try this weekend.  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    From initial evaluation:  Pt reports LBP x a couple of years. Left THR x 2 years ago with good recovery.  I have a right foot issue and may be having surgery.  I still work, my lb is priority for me right now.    PERTINENT HISTORY:  OA feet  PAIN:  Are you having pain? Yes: NPRS scale: current 1-2/10  Pain location: Lumbar spine through hips, buttocks to back of legs Pain description: burning, ache; tight; radicular pattern into legs  Aggravating  factors: walking starts after ~400 ft, lifting; the more I do; first thing in am Relieving factors: resting, heat  PRECAUTIONS: None  RED FLAGS: None   WEIGHT BEARING RESTRICTIONS: No  FALLS:  Has patient fallen in last 6 months? No  LIVING ENVIRONMENT: Lives with: lives alone Lives in: House/apartment Stairs: Yes: External: 3 steps; wall Has following equipment at home: None  OCCUPATION: Insurance  PLOF: Independent  PATIENT GOALS: decrease pain  NEXT MD VISIT: as needed  OBJECTIVE:  Note: Objective measures were completed at Evaluation unless otherwise noted.  DIAGNOSTIC FINDINGS:  LB Imaging 5/25: degenerative scoliosis as well as advanced degenerative disc disease with severe to space narrowing of all levels of the lumbar spine.   bilateral midfoot osteoarthritis; left hallux varus and right hallux valgus with bilateral 2nd hammertoes   PATIENT SURVEYS:  Modified Oswestry:  MODIFIED OSWESTRY DISABILITY SCALE  Date: 01/23/24 Score  Pain intensity 1 = The pain is bad, but I can manage without having to take (1) I can stand as long as I want but, it increases my pain. pain medication.  2. Personal care (washing, dressing, etc.) 1 =  I can take care of myself normally, but it increases my pain.  3. Lifting 1 = I can lift heavy weights, but it causes increased pain.  4. Walking 2 =  Pain prevents me from walking more than  mile.  5. Sitting 1 =  I can only sit in my favorite chair as long as I like.  6. Standing 3 =  Pain prevents me from standing more than 1/2 hour.  7. Sleeping 0 = Pain does not prevent me from sleeping well.  8. Social Life 2 = Pain prevents me from participating in more energetic activities (eg. sports, dancing).  9. Traveling 0 =  I can travel anywhere without increased pain.  10. Employment/ Homemaking 2 = I can perform most of my homemaking/job duties, but pain prevents me from performing more physically stressful activities (eg, lifting,  vacuuming).  Total 11/50=22%   Interpretation of scores: Score Category Description  0-20% Minimal Disability The patient can cope with most living activities. Usually no treatment is indicated apart from advice on lifting, sitting and exercise  21-40% Moderate Disability The patient experiences more pain and difficulty with sitting, lifting and standing. Travel and social life are more difficult and they may be disabled from work. Personal care, sexual activity and sleeping are not grossly affected, and the patient can usually be managed by conservative means  41-60% Severe Disability Pain remains the main problem in this group, but activities of daily living are affected. These patients require a detailed investigation  61-80% Crippled Back pain impinges on all aspects  of the patient's life. Positive intervention is required  81-100% Bed-bound  These patients are either bed-bound or exaggerating their symptoms  Bluford FORBES Zoe DELENA Karon DELENA, et al. Surgery versus conservative management of stable thoracolumbar fracture: the PRESTO feasibility RCT. [place] ([place]): [employer]; 2021 Nov. [facility] Technology Assessment, No. 25.62.) Appendix 3, Oswestry Disability Index category descriptors. Available from: FindJewel[employer]cz  Minimally Clinically Important Difference (MCID) = 12.8%  COGNITION: Overall cognitive status: Within functional limits for tasks assessed     SENSATION: WFL   POSTURE: rounded shoulders, forward head, right pelvic obliquity, and weight shift right  PALPATION: No TTP  LUMBAR ROM:   AROM eval  Flexion FT mid shin  Extension Just to neutral  Right lateral flexion 25% limited  Left lateral flexion 75% limited  Right rotation   Left rotation    (Blank rows = not tested)  LOWER EXTREMITY ROM:     wfl  LOWER EXTREMITY MMT:    MMT Right eval Left eval  Hip flexion 31.2 33.3  Hip extension    Hip abduction 17.6 19.6   Hip adduction    Hip internal rotation    Hip external rotation    Knee flexion    Knee extension 47.2 28.3  Ankle dorsiflexion    Ankle plantarflexion    Ankle inversion    Ankle eversion     (Blank rows = not tested)  LUMBAR SPECIAL TESTS:  Straight leg raise test: Negative, Slump test: Negative,  and Trendelenburg sign: Negative  FUNCTIONAL TESTS:  5 times sit to stand: 15.36 TUG: 13.52 4 stage balance test: passed 1&2.  Tandem needs asssitance to gain position holds for 3s; SLS x 5s  GAIT: Distance walked: 400 ft Assistive device utilized: None Level of assistance: Complete Independence Comments: trunk rotation right  TREATMENT  [facility] Adult PT Treatment:                                             Date: 02/01/24 Pt seen for aquatic therapy today.  Treatment took place in water  3.5-4.75 ft in depth at the [facility] pool. Temp of water  was 91.  Pt entered/exited the pool via stairs independently in step-to pattern (using RLE) with bil rail.  - unsupported walking forward/ backward - cues for even step length, - side stepping with arm add/abdct with yellow hand floats x 2 laps - suitcase carry with bil and single hand float at side, marching forward backward - plank with hands on bench in water  -> with hip ext x 10 each LE; fire hydrants x 8 each LE - STS from bench x 5 - UE on blue hand floats:  plank to/from superman x 3 reps;  LE circles CW/CCW; heel raises x 10; hip hinge forward arm reach   - marching forward / backward - seated on noodle (like swing)-> balance -> single hand raise -> cycling - Lt forward step ups /Rt retro step down x 10 -Lt/Rt hamstring stretch with foot on 2nd step, 15s x 2 each LE    PATIENT EDUCATION:  Education details: intro to aquatic therapy Person educated: Patient Education method: Explanation Education comprehension: verbalized understanding  HOME EXERCISE PROGRAM: TBA  ASSESSMENT:  CLINICAL IMPRESSION: Pt's  pain/tightness in back of legs and lower back waxes and wanes throughout session.  Will continue strengthening LLE with forward step ups while submerged  and Rt hip stretches. Trial fig 4 submerged next session.   Goals are ongoing.       From initial evaluation:  Patient is a 73 y.o. m who was seen today for physical therapy evaluation and treatment for chronic LBP due to Lumbar spondylosis. Patient presents with pain limited deficits in lumbar spine, ROM, endurance, activity tolerance, gait, balance, and functional mobility with ADL's. His pain sensitivity is moderate with activity with radicular pain into LE L>R daily but not continuously aligning with dx. Patient is having to modify amb time/distance to avoid increase in pain symptoms.  His goals are to improve his LB dysfunction in preparation for left foot surgery.  OBJECTIVE IMPAIRMENTS: Abnormal gait, decreased activity tolerance, decreased mobility, difficulty walking, decreased ROM, decreased strength, postural dysfunction, obesity, and pain.   ACTIVITY LIMITATIONS: carrying, lifting, sitting, standing, squatting, stairs, transfers, and locomotion level  PARTICIPATION LIMITATIONS: cleaning, community activity, and yard work  PERSONAL FACTORS: Fitness and Time since onset of injury/illness/exacerbation are also affecting patient's functional outcome.   REHAB POTENTIAL: Good  CLINICAL DECISION MAKING: Stable/uncomplicated  EVALUATION COMPLEXITY: Low   GOALS: Goals reviewed with patient? Yes  SHORT TERM GOALS: Target date: 02/22/24  Pt will tolerate full aquatic sessions consistently without increase in pain and with improving function to demonstrate good toleration and effectiveness of intervention.  Baseline: Goal status: INITIAL  2.  Pt will improve lumbar ROM Baseline: see chart Goal status: INITIAL   LONG TERM GOALS: Target date: 04/04/24  Pt to improve on ODI by 13-15% (MCID) to demonstrate statistically significant  Improvement in function. Baseline: 11/50=22% Goal status: INITIAL  2.  Pt will report decrease in pain by at least 50% for improved toleration to activity/quality of life and to demonstrate improved management of pain. Baseline: see chart Goal status: INITIAL  3.  Pt will tolerate stair climbing using alternating pattern ascending and descending 6 steps with use of handrail Baseline: step to Goal status: INITIAL  4.  Pt will report toleration to community amb without limitation to pain Baseline:  Goal status: INITIAL  5.  Pt will improve on 5 X STS test to <or=11 to demonstrate improving functional lower extremity strength, transitional movements, and balance. (MDC = 4.2sec)  Baseline: 15.36 Goal status: INITIAL  6.  Pt will be indep with final HEP's (land and aquatic as appropriate) for continued management of condition Baseline:  Goal status: INITIAL  PLAN:  PT FREQUENCY: 1-2x/week  PT DURATION: 10 weeks extended out 2 weeks due to scheduling conflicts; 16 visits  PLANNED INTERVENTIONS: 97164- PT Re-evaluation, 97750- Physical Performance Testing, 97110-Therapeutic exercises, 97530- Therapeutic activity, [id number]- Neuromuscular re-education, 97535- Self Care, [id number]- Manual therapy, [id number]- Gait training, [phone]- Aquatic Therapy, [phone]- Electrical stimulation (manual), [id number]- Traction (mechanical), 20560 (1-2 muscles), 20561 (3+ muscles)- Dry Needling, Patient/Family education, Balance training, Stair training, Taping, Joint mobilization, DME instructions, Cryotherapy, and Moist heat.  PLAN FOR NEXT SESSION: Aquatic and land: core and LE strengthening and stretching; posture realignment, pain management; gait, balance and proprioception re-training.  Delon Aquas, PTA 02/01/24 4:48 PM [facility] [address] [place], [place], [address] Phone: [phone]   Fax:  [phone]

## 2024-02-12 ENCOUNTER — Ambulatory Visit (HOSPITAL_BASED_OUTPATIENT_CLINIC_OR_DEPARTMENT_OTHER): Attending: Orthopedic Surgery | Admitting: Physical Therapy

## 2024-02-12 ENCOUNTER — Telehealth (HOSPITAL_BASED_OUTPATIENT_CLINIC_OR_DEPARTMENT_OTHER): Payer: Self-pay | Admitting: Physical Therapy

## 2024-02-12 ENCOUNTER — Encounter (HOSPITAL_BASED_OUTPATIENT_CLINIC_OR_DEPARTMENT_OTHER): Payer: Self-pay

## 2024-02-12 DIAGNOSIS — M5459 Other low back pain: Secondary | ICD-10-CM | POA: Insufficient documentation

## 2024-02-12 DIAGNOSIS — M6281 Muscle weakness (generalized): Secondary | ICD-10-CM | POA: Insufficient documentation

## 2024-02-12 DIAGNOSIS — R262 Difficulty in walking, not elsewhere classified: Secondary | ICD-10-CM | POA: Insufficient documentation

## 2024-02-12 DIAGNOSIS — R293 Abnormal posture: Secondary | ICD-10-CM | POA: Insufficient documentation

## 2024-02-12 NOTE — Telephone Encounter (Signed)
 Patient did not show for Aquatic PT appointment.  Called and spoke with patient.  He apologized and thought his appointment was at 11:45am, not 11am.   Confirmed next upcoming appointment with him.    Informed patient that 02/21/24 appointment will need to be rescheduled, as his provider will be out of office.   Delon Aquas, PTA 02/12/24 11:43 AM [facility] [address] [place], [place], [address] Phone: [phone]   Fax:  [phone]

## 2024-02-15 ENCOUNTER — Ambulatory Visit (HOSPITAL_BASED_OUTPATIENT_CLINIC_OR_DEPARTMENT_OTHER): Admitting: Physical Therapy

## 2024-02-15 ENCOUNTER — Encounter (HOSPITAL_BASED_OUTPATIENT_CLINIC_OR_DEPARTMENT_OTHER): Payer: Self-pay | Admitting: Physical Therapy

## 2024-02-15 DIAGNOSIS — R293 Abnormal posture: Secondary | ICD-10-CM

## 2024-02-15 DIAGNOSIS — R262 Difficulty in walking, not elsewhere classified: Secondary | ICD-10-CM | POA: Diagnosis not present

## 2024-02-15 DIAGNOSIS — M6281 Muscle weakness (generalized): Secondary | ICD-10-CM

## 2024-02-15 DIAGNOSIS — M5459 Other low back pain: Secondary | ICD-10-CM

## 2024-02-15 NOTE — Therapy (Signed)
 OUTPATIENT PHYSICAL THERAPY THORACOLUMBAR TREATMENT   Patient Name: Samuel [name] MRN: [id number] DOB:04/22/1951, 74 y.o., male Today's Date: 02/15/2024  END OF SESSION:  PT End of Session - 02/15/24 1157     Visit Number 5    Number of Visits 16    Date for PT Re-Evaluation 04/04/24    Authorization Type MCR    PT Start Time 1145    PT Stop Time 1225    PT Time Calculation (min) 40 min    Activity Tolerance Patient tolerated treatment well    Behavior During Therapy [facility] for tasks assessed/performed          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   severe- right anterior shoulder- (EXC)   Atypical nevus 07/18/2011   mild-mod-right upper abdomen   Back pain    Basal cell carcinoma 07/17/2019   nod-left forearm-([id number])   COVID 01/07/2021   EPISTAXIS, RECURRENT 12/21/2009   ESSENTIAL HYPERTENSION 12/21/2009   Hypertension    Joint pain    Other fatigue    Prediabetes    Retinal detachment    OD   Shortness of breath on exertion    VENTRICULAR HYPERTROPHY, LEFT 01/04/2010   Past Surgical History:  Procedure Laterality Date   CATARACT EXTRACTION Bilateral 2009   EYE SURGERY Bilateral 2009   cataract   GAS/FLUID EXCHANGE Right 09/26/2018   Procedure: Gas/Fluid Exchange;  Surgeon: Valdemar Rogue, MD;  Location: [facility] OR;  Service: Ophthalmology;  Laterality: Right;   PHOTOCOAGULATION WITH LASER Right 09/26/2018   Procedure: Photocoagulation With Laser;  Surgeon: Valdemar Rogue, MD;  Location: [facility] OR;  Service: Ophthalmology;  Laterality: Right;   SCLERAL BUCKLE Right 09/26/2018   Procedure: Scleral Buckle;  Surgeon: Valdemar Rogue, MD;  Location: [facility] OR;  Service: Ophthalmology;  Laterality: Right;   TOTAL HIP ARTHROPLASTY Right 02/16/2022   VITRECTOMY 25 GAUGE WITH SCLERAL BUCKLE Right 09/26/2018   Procedure: VITRECTOMY 25 GAUGE;  Surgeon: Valdemar Rogue, MD;  Location: [facility] OR;   Service: Ophthalmology;  Laterality: Right;   Patient Active Problem List   Diagnosis Date Noted   SOB (shortness of breath) on exertion 12/20/2021   Chronic osteoarthritis, Hip 11/21/2021   Pre-diabetes 11/21/2021   Urinary frequency 11/21/2021   Dehydration, mild 08/11/2021   Class 2 severe obesity with serious comorbidity and body mass index (BMI) of 39.0 to 39.9 in adult (HCC) 07/21/2021   NAFLD (nonalcoholic fatty liver disease) 98/87/7976   Hyperlipidemia 03/01/2021   Prediabetes 07/17/2017   VENTRICULAR HYPERTROPHY, LEFT 01/04/2010   Essential hypertension 12/21/2009   EPISTAXIS, RECURRENT 12/21/2009   ABNORMAL ELECTROCARDIOGRAM 12/21/2009    PCP: Wolm Baize MD  REFERRING PROVIDER: Donaciano Sprang MD  REFERRING DIAG: M54.50 (ICD-10-CM) - Low back pain, unspecified   Rationale for Evaluation and Treatment: Rehabilitation  THERAPY DIAG:  Other low back pain  Abnormal posture  Difficulty in walking, not elsewhere classified  Muscle weakness (generalized)  ONSET DATE:  a few years  SUBJECTIVE:  SUBJECTIVE STATEMENT: Pt reports the sciatica in LLE is not as frequent.  Pain is still the worst upon waking.  Plans to get massages at [facility] soon (starting 9/5-1x/wk for 5 wks)  POOL ACCESS: at apartment complex and is now a member of [facility].    From initial evaluation:  Pt reports LBP x a couple of years. Left THR x 2 years ago with good recovery.  I have a right foot issue and may be having surgery.  I still work, my lb is priority for me right now.    PERTINENT HISTORY:  OA feet  PAIN:  Are you having pain? Yes: NPRS scale: current 1/10  Pain location: Lumbar spine through hips, buttocks to back of legs Pain description: burning, ache; tight; radicular pattern into legs   Aggravating factors: walking starts after ~400 ft, lifting; the more I do; first thing in am Relieving factors: resting, heat  PRECAUTIONS: None  RED FLAGS: None   WEIGHT BEARING RESTRICTIONS: No  FALLS:  Has patient fallen in last 6 months? No  LIVING ENVIRONMENT: Lives with: lives alone Lives in: House/apartment Stairs: Yes: External: 3 steps; wall Has following equipment at home: None  OCCUPATION: Insurance  PLOF: Independent  PATIENT GOALS: decrease pain  NEXT MD VISIT: as needed  OBJECTIVE:  Note: Objective measures were completed at Evaluation unless otherwise noted.  DIAGNOSTIC FINDINGS:  LB Imaging 5/25: degenerative scoliosis as well as advanced degenerative disc disease with severe to space narrowing of all levels of the lumbar spine.   bilateral midfoot osteoarthritis; left hallux varus and right hallux valgus with bilateral 2nd hammertoes   PATIENT SURVEYS:  Modified Oswestry:  MODIFIED OSWESTRY DISABILITY SCALE  Date: 01/23/24 Score  Pain intensity 1 = The pain is bad, but I can manage without having to take (1) I can stand as long as I want but, it increases my pain. pain medication.  2. Personal care (washing, dressing, etc.) 1 =  I can take care of myself normally, but it increases my pain.  3. Lifting 1 = I can lift heavy weights, but it causes increased pain.  4. Walking 2 =  Pain prevents me from walking more than  mile.  5. Sitting 1 =  I can only sit in my favorite chair as long as I like.  6. Standing 3 =  Pain prevents me from standing more than 1/2 hour.  7. Sleeping 0 = Pain does not prevent me from sleeping well.  8. Social Life 2 = Pain prevents me from participating in more energetic activities (eg. sports, dancing).  9. Traveling 0 =  I can travel anywhere without increased pain.  10. Employment/ Homemaking 2 = I can perform most of my homemaking/job duties, but pain prevents me from performing more physically stressful activities (eg,  lifting, vacuuming).  Total 11/50=22%   Interpretation of scores: Score Category Description  0-20% Minimal Disability The patient can cope with most living activities. Usually no treatment is indicated apart from advice on lifting, sitting and exercise  21-40% Moderate Disability The patient experiences more pain and difficulty with sitting, lifting and standing. Travel and social life are more difficult and they may be disabled from work. Personal care, sexual activity and sleeping are not grossly affected, and the patient can usually be managed by conservative means  41-60% Severe Disability Pain remains the main problem in this group, but activities of daily living are affected. These patients require a detailed investigation  61-80% Crippled Back pain  impinges on all aspects of the patient's life. Positive intervention is required  81-100% Bed-bound  These patients are either bed-bound or exaggerating their symptoms  Bluford FORBES Zoe DELENA Karon DELENA, et al. Surgery versus conservative management of stable thoracolumbar fracture: the PRESTO feasibility RCT. [place] ([place]): [employer]; 2021 Nov. [facility] Technology Assessment, No. 25.62.) Appendix 3, Oswestry Disability Index category descriptors. Available from: Find[employer]el[employer]cz  Minimally Clinically Important Difference (MCID) = 12.8%  COGNITION: Overall cognitive status: Within functional limits for tasks assessed     SENSATION: WFL   POSTURE: rounded shoulders, forward head, right pelvic obliquity, and weight shift right  PALPATION: No TTP  LUMBAR ROM:   AROM eval  Flexion FT mid shin  Extension Just to neutral  Right lateral flexion 25% limited  Left lateral flexion 75% limited  Right rotation   Left rotation    (Blank rows = not tested)  LOWER EXTREMITY ROM:     wfl  LOWER EXTREMITY MMT:    MMT Right eval Left eval  Hip flexion 31.2 33.3  Hip extension    Hip abduction  17.6 19.6  Hip adduction    Hip internal rotation    Hip external rotation    Knee flexion    Knee extension 47.2 28.3  Ankle dorsiflexion    Ankle plantarflexion    Ankle inversion    Ankle eversion     (Blank rows = not tested)  LUMBAR SPECIAL TESTS:  Straight leg raise test: Negative, Slump test: Negative,  and Trendelenburg sign: Negative  FUNCTIONAL TESTS:  5 times sit to stand: 15.36 TUG: 13.52 4 stage balance test: passed 1&2.  Tandem needs asssitance to gain position holds for 3s; SLS x 5s  GAIT: Distance walked: 400 ft Assistive device utilized: None Level of assistance: Complete Independence Comments: trunk rotation right  TREATMENT  [facility] Adult PT Treatment:                                             Date: 02/01/24 Pt seen for aquatic therapy today.  Treatment took place in water  3.5-4.75 ft in depth at the [facility] pool. Temp of water  was 91.  Pt entered/exited the pool via stairs independently in step-to pattern (using RLE) with bil rail.  - unsupported walking forward/ backward - cues for even step length, - side stepping with arm add/abdct with yellow hand floats  - suitcase carry with bil and single yellow hand float at side, marching forward backward x 10reps - plank to/from superman with yellow hand floats  - Lt forward step ups /Rt retro step down x 7 with bilat rail (limited ability to control retro step down with Lt knee; buckles)  - Lt lateral step up with bilat on wall/rail x 2; limited tolerance -> blue step at 4+ ft with Lt forward step up/ Rt retro step down x 7 (Improved control/tolerance); Lt lateral step up x 5, continued difficulty of control with Lt quad; buckle with Rt step down - return to walking forward/ backward  - fig4 stretch R/L/R with UE on rails x 15s each     PATIENT EDUCATION:  Education details: intro to aquatic therapy Person educated: Patient Education method: Explanation Education comprehension: verbalized  understanding  HOME EXERCISE PROGRAM: TBA  ASSESSMENT:  CLINICAL IMPRESSION: Pt reports increased tightness in lower back with hip hinge/forward arm reach.  He  continues with difficulty ascending with Lt leg and descending leading with RLE (using LLE); Lt knee buckles when descending, but less when step moved to 4+ ft.  Pt reported increased tenderness in Lt hip after completing stairs with LLE.  Pt making gradual progress towards goals. Therapist to ck STG2 next visit, as time allows.       From initial evaluation:  Patient is a 73 y.o. m who was seen today for physical therapy evaluation and treatment for chronic LBP due to Lumbar spondylosis. Patient presents with pain limited deficits in lumbar spine, ROM, endurance, activity tolerance, gait, balance, and functional mobility with ADL's. His pain sensitivity is moderate with activity with radicular pain into LE L>R daily but not continuously aligning with dx. Patient is having to modify amb time/distance to avoid increase in pain symptoms.  His goals are to improve his LB dysfunction in preparation for left foot surgery.  OBJECTIVE IMPAIRMENTS: Abnormal gait, decreased activity tolerance, decreased mobility, difficulty walking, decreased ROM, decreased strength, postural dysfunction, obesity, and pain.   ACTIVITY LIMITATIONS: carrying, lifting, sitting, standing, squatting, stairs, transfers, and locomotion level  PARTICIPATION LIMITATIONS: cleaning, community activity, and yard work  PERSONAL FACTORS: Fitness and Time since onset of injury/illness/exacerbation are also affecting patient's functional outcome.   REHAB POTENTIAL: Good  CLINICAL DECISION MAKING: Stable/uncomplicated  EVALUATION COMPLEXITY: Low   GOALS: Goals reviewed with patient? Yes  SHORT TERM GOALS: Target date: 02/22/24  Pt will tolerate full aquatic sessions consistently without increase in pain and with improving function to demonstrate good toleration  and effectiveness of intervention.  Baseline: Goal status: MET - 02/15/24  2.  Pt will improve lumbar ROM Baseline: see chart Goal status: INITIAL   LONG TERM GOALS: Target date: 04/04/24  Pt to improve on ODI by 13-15% (MCID) to demonstrate statistically significant Improvement in function. Baseline: 11/50=22% Goal status: INITIAL  2.  Pt will report decrease in pain by at least 50% for improved toleration to activity/quality of life and to demonstrate improved management of pain. Baseline: see chart Goal status: INITIAL  3.  Pt will tolerate stair climbing using alternating pattern ascending and descending 6 steps with use of handrail Baseline: step to Goal status: INITIAL  4.  Pt will report toleration to community amb without limitation to pain Baseline:  Goal status: INITIAL  5.  Pt will improve on 5 X STS test to <or=11 to demonstrate improving functional lower extremity strength, transitional movements, and balance. (MDC = 4.2sec)  Baseline: 15.36 Goal status: INITIAL  6.  Pt will be indep with final HEP's (land and aquatic as appropriate) for continued management of condition Baseline:  Goal status: INITIAL  PLAN:  PT FREQUENCY: 1-2x/week  PT DURATION: 10 weeks extended out 2 weeks due to scheduling conflicts; 16 visits  PLANNED INTERVENTIONS: [id number]- PT Re-evaluation, 97750- Physical Performance Testing, 97110-Therapeutic exercises, 97530- Therapeutic activity, [id number]- Neuromuscular re-education, 97535- Self Care, [id number]- Manual therapy, [id number]- Gait training, [phone]- Aquatic Therapy, [id number]- Electrical stimulation (manual), [id number]- Traction (mechanical), 20560 (1-2 muscles), 20561 (3+ muscles)- Dry Needling, Patient/Family education, Balance training, Stair training, Taping, Joint mobilization, DME instructions, Cryotherapy, and Moist heat.  PLAN FOR NEXT SESSION: Aquatic and land: core and LE strengthening and stretching; posture realignment, pain management; gait, balance  and proprioception re-training.  Delon Aquas, PTA 02/15/24 1:55 PM [facility] [address] [place], [place], [address] Phone: [phone]   Fax:  [phone]

## 2024-02-18 ENCOUNTER — Encounter (HOSPITAL_BASED_OUTPATIENT_CLINIC_OR_DEPARTMENT_OTHER): Payer: Self-pay | Admitting: Physical Therapy

## 2024-02-18 ENCOUNTER — Ambulatory Visit (HOSPITAL_BASED_OUTPATIENT_CLINIC_OR_DEPARTMENT_OTHER): Admitting: Physical Therapy

## 2024-02-18 DIAGNOSIS — R262 Difficulty in walking, not elsewhere classified: Secondary | ICD-10-CM

## 2024-02-18 DIAGNOSIS — R293 Abnormal posture: Secondary | ICD-10-CM

## 2024-02-18 DIAGNOSIS — M6281 Muscle weakness (generalized): Secondary | ICD-10-CM

## 2024-02-18 DIAGNOSIS — M5459 Other low back pain: Secondary | ICD-10-CM | POA: Diagnosis not present

## 2024-02-18 NOTE — Therapy (Signed)
 OUTPATIENT PHYSICAL THERAPY THORACOLUMBAR TREATMENT   Patient Name: Samuel [name] MRN: [id number] DOB:[birth year], 73 y.o., male Today's Date: 02/18/2024  END OF SESSION:  PT End of Session - 02/18/24 1706     Visit Number 6    Number of Visits 16    Date for PT Re-Evaluation 04/04/24    Authorization Type MCR    PT Start Time 1655    PT Stop Time 1735    PT Time Calculation (min) 40 min    Activity Tolerance Patient tolerated treatment well    Behavior During Therapy [facility] for tasks assessed/performed           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   severe- right anterior shoulder- (EXC)   Atypical nevus 07/18/2011   mild-mod-right upper abdomen   Back pain    Basal cell carcinoma 07/17/2019   nod-left forearm-([id number])   COVID 01/07/2021   EPISTAXIS, RECURRENT 12/21/2009   ESSENTIAL HYPERTENSION 12/21/2009   Hypertension    Joint pain    Other fatigue    Prediabetes    Retinal detachment    OD   Shortness of breath on exertion    VENTRICULAR HYPERTROPHY, LEFT 01/04/2010   Past Surgical History:  Procedure Laterality Date   CATARACT EXTRACTION Bilateral 2009   EYE SURGERY Bilateral 2009   cataract   GAS/FLUID EXCHANGE Right 09/26/2018   Procedure: Gas/Fluid Exchange;  Surgeon: Valdemar Rogue, MD;  Location: [facility] OR;  Service: Ophthalmology;  Laterality: Right;   PHOTOCOAGULATION WITH LASER Right 09/26/2018   Procedure: Photocoagulation With Laser;  Surgeon: Valdemar Rogue, MD;  Location: [facility] OR;  Service: Ophthalmology;  Laterality: Right;   SCLERAL BUCKLE Right 09/26/2018   Procedure: Scleral Buckle;  Surgeon: Valdemar Rogue, MD;  Location: [facility] OR;  Service: Ophthalmology;  Laterality: Right;   TOTAL HIP ARTHROPLASTY Right 02/16/2022   VITRECTOMY 25 GAUGE WITH SCLERAL BUCKLE Right 09/26/2018   Procedure: VITRECTOMY 25 GAUGE;  Surgeon: Valdemar Rogue, MD;  Location: [facility] OR;   Service: Ophthalmology;  Laterality: Right;   Patient Active Problem List   Diagnosis Date Noted   SOB (shortness of breath) on exertion 12/20/2021   Chronic osteoarthritis, Hip 11/21/2021   Pre-diabetes 11/21/2021   Urinary frequency 11/21/2021   Dehydration, mild 08/11/2021   Class 2 severe obesity with serious comorbidity and body mass index (BMI) of 39.0 to 39.9 in adult (HCC) 07/21/2021   NAFLD (nonalcoholic fatty liver disease) 98/87/7976   Hyperlipidemia 03/01/2021   Prediabetes 07/17/2017   VENTRICULAR HYPERTROPHY, LEFT 01/04/2010   Essential hypertension 12/21/2009   EPISTAXIS, RECURRENT 12/21/2009   ABNORMAL ELECTROCARDIOGRAM 12/21/2009    PCP: Wolm Baize MD  REFERRING PROVIDER: Donaciano Sprang MD  REFERRING DIAG: M54.50 (ICD-10-CM) - Low back pain, unspecified   Rationale for Evaluation and Treatment: Rehabilitation  THERAPY DIAG:  Other low back pain  Abnormal posture  Difficulty in walking, not elsewhere classified  Muscle weakness (generalized)  ONSET DATE:  a few years  SUBJECTIVE:  SUBJECTIVE STATEMENT: No change since last session. I do feel better after therapy sessions  POOL ACCESS: at apartment complex and is now a member of [employer].    From initial evaluation:  Pt reports LBP x a couple of years. Left THR x 2 years ago with good recovery.  I have a right foot issue and may be having surgery.  I still work, my lb is priority for me right now.    PERTINENT HISTORY:  OA feet  PAIN:  Are you having pain? Yes: NPRS scale: current 1/10  Pain location: Lumbar spine through hips, buttocks to back of legs Pain description: burning, ache; tight; radicular pattern into legs  Aggravating factors: walking starts after ~400 ft, lifting; the more I do; first thing in  am Relieving factors: resting, heat  PRECAUTIONS: None  RED FLAGS: None   WEIGHT BEARING RESTRICTIONS: No  FALLS:  Has patient fallen in last 6 months? No  LIVING ENVIRONMENT: Lives with: lives alone Lives in: House/apartment Stairs: Yes: External: 3 steps; wall Has following equipment at home: None  OCCUPATION: Insurance  PLOF: Independent  PATIENT GOALS: decrease pain  NEXT MD VISIT: as needed  OBJECTIVE:  Note: Objective measures were completed at Evaluation unless otherwise noted.  DIAGNOSTIC FINDINGS:  LB Imaging 5/25: degenerative scoliosis as well as advanced degenerative disc disease with severe to space narrowing of all levels of the lumbar spine.   bilateral midfoot osteoarthritis; left hallux varus and right hallux valgus with bilateral 2nd hammertoes   PATIENT SURVEYS:  Modified Oswestry:  MODIFIED OSWESTRY DISABILITY SCALE  Date: 01/23/24 Score  Pain intensity 1 = The pain is bad, but I can manage without having to take (1) I can stand as long as I want but, it increases my pain. pain medication.  2. Personal care (washing, dressing, etc.) 1 =  I can take care of myself normally, but it increases my pain.  3. Lifting 1 = I can lift heavy weights, but it causes increased pain.  4. Walking 2 =  Pain prevents me from walking more than  mile.  5. Sitting 1 =  I can only sit in my favorite chair as long as I like.  6. Standing 3 =  Pain prevents me from standing more than 1/2 hour.  7. Sleeping 0 = Pain does not prevent me from sleeping well.  8. Social Life 2 = Pain prevents me from participating in more energetic activities (eg. sports, dancing).  9. Traveling 0 =  I can travel anywhere without increased pain.  10. Employment/ Homemaking 2 = I can perform most of my homemaking/job duties, but pain prevents me from performing more physically stressful activities (eg, lifting, vacuuming).  Total 11/50=22%   Interpretation of scores: Score Category  Description  0-20% Minimal Disability The patient can cope with most living activities. Usually no treatment is indicated apart from advice on lifting, sitting and exercise  21-40% Moderate Disability The patient experiences more pain and difficulty with sitting, lifting and standing. Travel and social life are more difficult and they may be disabled from work. Personal care, sexual activity and sleeping are not grossly affected, and the patient can usually be managed by conservative means  41-60% Severe Disability Pain remains the main problem in this group, but activities of daily living are affected. These patients require a detailed investigation  61-80% Crippled Back pain impinges on all aspects of the patient's life. Positive intervention is required  81-100% Bed-bound  These patients are  either bed-bound or exaggerating their symptoms  Bluford FORBES Zoe DELENA Karon DELENA, et al. Surgery versus conservative management of stable thoracolumbar fracture: the PRESTO feasibility RCT. [place] ([place]): [employer]; 2021 Nov. [facility] [employer] Assessment, No. 25.62.) Appendix 3, Oswestry Disability Index category descriptors. Available from: [employer]cz  Minimally Clinically Important Difference (MCID) = 12.8%  COGNITION: Overall cognitive status: Within functional limits for tasks assessed     SENSATION: WFL   POSTURE: rounded shoulders, forward head, right pelvic obliquity, and weight shift right  PALPATION: No TTP  LUMBAR ROM:   AROM eval 02/18/24  Flexion FT mid shin   Extension Just to neutral No change  Right lateral flexion 25% limited 25% limited  Left lateral flexion 75% limited 60% limited  Right rotation    Left rotation     (Blank rows = not tested)  LOWER EXTREMITY ROM:     wfl  LOWER EXTREMITY MMT:    MMT Right eval Left eval  Hip flexion 31.2 33.3  Hip extension    Hip abduction 17.6 19.6  Hip adduction    Hip internal  rotation    Hip external rotation    Knee flexion    Knee extension 47.2 28.3  Ankle dorsiflexion    Ankle plantarflexion    Ankle inversion    Ankle eversion     (Blank rows = not tested)  LUMBAR SPECIAL TESTS:  Straight leg raise test: Negative, Slump test: Negative,  and Trendelenburg sign: Negative  FUNCTIONAL TESTS:  5 times sit to stand: 15.36 TUG: 13.52 4 stage balance test: passed 1&2.  Tandem needs asssitance to gain position holds for 3s; SLS x 5s  GAIT: Distance walked: 400 ft Assistive device utilized: None Level of assistance: Complete Independence Comments: trunk rotation right  TREATMENT  [facility] Adult PT Treatment:                                             Date: 02/18/24 Pt seen for aquatic therapy today.  Treatment took place in water  3.5-4.75 ft in depth at the [facility] pool. Temp of water  was 91.  Pt entered/exited the pool via stairs independently in step-to pattern (using RLE) with bil rail.  - unsupported walking forward/ backward - cues for even step length, - side stepping with arm add/abdct with yellow hand floats  -L stretch - fig4 stretch R/L/R with UE on rails x 15s each - suitcase carry with bil and single yellow hand float at side, marching forward backward  -step ups onto bottom step leading L then R x 5-10 -yellow noodle pull down for TrA engagement wide stance then staggered x 5-10 -Oblique press using yellow  noodle -forward march with kick x 2 widths ue support Yellow HB -tandem stance leading R/L ue support yellow HB in 4.2 ft->ue add/abd (good challenge) x10 - walking forward/ backward between exercises for recovery      PATIENT EDUCATION:  Education details: intro to aquatic therapy Person educated: Patient Education method: Explanation Education comprehension: verbalized understanding  HOME EXERCISE PROGRAM: TBA  ASSESSMENT:  CLINICAL IMPRESSION: Good response from last aquatic session no residual pain or fatigue.  H tolerates progression of le strengthening with added focus on core strength.  Left knee continues to buckle with descending steps. He does show improvement with quad and VMO control ascending leading left.  He demonstrates a slight  improvement in lumbar side bending  but continues to be limited in extension. Goals ongoing    From initial evaluation:  Patient is a 73 y.o. m who was seen today for physical therapy evaluation and treatment for chronic LBP due to Lumbar spondylosis. Patient presents with pain limited deficits in lumbar spine, ROM, endurance, activity tolerance, gait, balance, and functional mobility with ADL's. His pain sensitivity is moderate with activity with radicular pain into LE L>R daily but not continuously aligning with dx. Patient is having to modify amb time/distance to avoid increase in pain symptoms.  His goals are to improve his LB dysfunction in preparation for left foot surgery.  OBJECTIVE IMPAIRMENTS: Abnormal gait, decreased activity tolerance, decreased mobility, difficulty walking, decreased ROM, decreased strength, postural dysfunction, obesity, and pain.   ACTIVITY LIMITATIONS: carrying, lifting, sitting, standing, squatting, stairs, transfers, and locomotion level  PARTICIPATION LIMITATIONS: cleaning, community activity, and yard work  PERSONAL FACTORS: Fitness and Time since onset of injury/illness/exacerbation are also affecting patient's functional outcome.   REHAB POTENTIAL: Good  CLINICAL DECISION MAKING: Stable/uncomplicated  EVALUATION COMPLEXITY: Low   GOALS: Goals reviewed with patient? Yes  SHORT TERM GOALS: Target date: 02/22/24  Pt will tolerate full aquatic sessions consistently without increase in pain and with improving function to demonstrate good toleration and effectiveness of intervention.  Baseline: Goal status: MET - 02/15/24  2.  Pt will improve lumbar ROM Baseline: see chart Goal status: in progress 02/18/24   LONG TERM  GOALS: Target date: 04/04/24  Pt to improve on ODI by 13-15% (MCID) to demonstrate statistically significant Improvement in function. Baseline: 11/50=22% Goal status: INITIAL  2.  Pt will report decrease in pain by at least 50% for improved toleration to activity/quality of life and to demonstrate improved management of pain. Baseline: see chart Goal status: INITIAL  3.  Pt will tolerate stair climbing using alternating pattern ascending and descending 6 steps with use of handrail Baseline: step to Goal status: INITIAL  4.  Pt will report toleration to community amb without limitation to pain Baseline:  Goal status: INITIAL  5.  Pt will improve on 5 X STS test to <or=11 to demonstrate improving functional lower extremity strength, transitional movements, and balance. (MDC = 4.2sec)  Baseline: 15.36 Goal status: INITIAL  6.  Pt will be indep with final HEP's (land and aquatic as appropriate) for continued management of condition Baseline:  Goal status: INITIAL  PLAN:  PT FREQUENCY: 1-2x/week  PT DURATION: 10 weeks extended out 2 weeks due to scheduling conflicts; 16 visits  PLANNED INTERVENTIONS: 97164- PT Re-evaluation, 97750- Physical Performance Testing, 97110-Therapeutic exercises, 97530- Therapeutic activity, [id number]- Neuromuscular re-education, 97535- Self Care, [id number]- Manual therapy, [id number]- Gait training, [phone]- Aquatic Therapy, [phone]- Electrical stimulation (manual), [id number]- Traction (mechanical), 20560 (1-2 muscles), 20561 (3+ muscles)- Dry Needling, Patient/Family education, Balance training, Stair training, Taping, Joint mobilization, DME instructions, Cryotherapy, and Moist heat.  PLAN FOR NEXT SESSION: Aquatic and land: core and LE strengthening and stretching; posture realignment, pain management; gait, balance and proprioception re-training.  Ronal [place]) Tyshea Imel MPT 02/18/24 5:55 PM [facility] [address] [place], [place], [address] Phone: [phone]   Fax:  [phone]

## 2024-02-21 ENCOUNTER — Ambulatory Visit (HOSPITAL_BASED_OUTPATIENT_CLINIC_OR_DEPARTMENT_OTHER): Admitting: Physical Therapy

## 2024-02-25 ENCOUNTER — Other Ambulatory Visit (INDEPENDENT_AMBULATORY_CARE_PROVIDER_SITE_OTHER): Payer: Self-pay | Admitting: Ophthalmology

## 2024-02-26 ENCOUNTER — Encounter (HOSPITAL_BASED_OUTPATIENT_CLINIC_OR_DEPARTMENT_OTHER): Payer: Self-pay | Admitting: Physical Therapy

## 2024-02-26 ENCOUNTER — Ambulatory Visit (HOSPITAL_BASED_OUTPATIENT_CLINIC_OR_DEPARTMENT_OTHER): Admitting: Physical Therapy

## 2024-02-26 DIAGNOSIS — M6281 Muscle weakness (generalized): Secondary | ICD-10-CM

## 2024-02-26 DIAGNOSIS — M5459 Other low back pain: Secondary | ICD-10-CM

## 2024-02-26 DIAGNOSIS — R262 Difficulty in walking, not elsewhere classified: Secondary | ICD-10-CM

## 2024-02-26 DIAGNOSIS — R293 Abnormal posture: Secondary | ICD-10-CM | POA: Diagnosis not present

## 2024-02-26 NOTE — Therapy (Signed)
 OUTPATIENT PHYSICAL THERAPY THORACOLUMBAR TREATMENT   Patient Name: Samuel [name] MRN: [id number] DOB:May 12, 1951, 73 y.o., male Today's Date: 02/26/2024  END OF SESSION:  PT End of Session - 02/26/24 1613     Visit Number 7    Number of Visits 16    Date for PT Re-Evaluation 04/04/24    Authorization Type MCR    PT Start Time 1615    PT Stop Time 1655    PT Time Calculation (min) 40 min    Activity Tolerance Patient tolerated treatment well    Behavior During Therapy [facility] for tasks assessed/performed           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   severe- right anterior shoulder- (EXC)   Atypical nevus 07/18/2011   mild-mod-right upper abdomen   Back pain    Basal cell carcinoma 07/17/2019   nod-left forearm-([id number])   COVID 01/07/2021   EPISTAXIS, RECURRENT 12/21/2009   ESSENTIAL HYPERTENSION 12/21/2009   Hypertension    Joint pain    Other fatigue    Prediabetes    Retinal detachment    OD   Shortness of breath on exertion    VENTRICULAR HYPERTROPHY, LEFT 01/04/2010   Past Surgical History:  Procedure Laterality Date   CATARACT EXTRACTION Bilateral 2009   EYE SURGERY Bilateral 2009   cataract   GAS/FLUID EXCHANGE Right 09/26/2018   Procedure: Gas/Fluid Exchange;  Surgeon: Valdemar Rogue, MD;  Location: [facility] OR;  Service: Ophthalmology;  Laterality: Right;   PHOTOCOAGULATION WITH LASER Right 09/26/2018   Procedure: Photocoagulation With Laser;  Surgeon: Valdemar Rogue, MD;  Location: [facility] OR;  Service: Ophthalmology;  Laterality: Right;   SCLERAL BUCKLE Right 09/26/2018   Procedure: Scleral Buckle;  Surgeon: Valdemar Rogue, MD;  Location: [facility] OR;  Service: Ophthalmology;  Laterality: Right;   TOTAL HIP ARTHROPLASTY Right 02/16/2022   VITRECTOMY 25 GAUGE WITH SCLERAL BUCKLE Right 09/26/2018   Procedure: VITRECTOMY 25 GAUGE;  Surgeon: Valdemar Rogue, MD;  Location: [facility] OR;   Service: Ophthalmology;  Laterality: Right;   Patient Active Problem List   Diagnosis Date Noted   SOB (shortness of breath) on exertion 12/20/2021   Chronic osteoarthritis, Hip 11/21/2021   Pre-diabetes 11/21/2021   Urinary frequency 11/21/2021   Dehydration, mild 08/11/2021   Class 2 severe obesity with serious comorbidity and body mass index (BMI) of 39.0 to 39.9 in adult (HCC) 07/21/2021   NAFLD (nonalcoholic fatty liver disease) 98/87/7976   Hyperlipidemia 03/01/2021   Prediabetes 07/17/2017   VENTRICULAR HYPERTROPHY, LEFT 01/04/2010   Essential hypertension 12/21/2009   EPISTAXIS, RECURRENT 12/21/2009   ABNORMAL ELECTROCARDIOGRAM 12/21/2009    PCP: Wolm Baize MD  REFERRING PROVIDER: Donaciano Sprang MD  REFERRING DIAG: M54.50 (ICD-10-CM) - Low back pain, unspecified   Rationale for Evaluation and Treatment: Rehabilitation  THERAPY DIAG:  Other low back pain  Abnormal posture  Difficulty in walking, not elsewhere classified  Muscle weakness (generalized)  ONSET DATE:  a few years  SUBJECTIVE:  SUBJECTIVE STATEMENT: Low pain today. I came on Wed last week and did some exercises in pool by myself  POOL ACCESS: at apartment complex and is now a member of [employer].    From initial evaluation:  Pt reports LBP x a couple of years. Left THR x 2 years ago with good recovery.  I have a right foot issue and may be having surgery.  I still work, my lb is priority for me right now.    PERTINENT HISTORY:  OA feet  PAIN:  Are you having pain? Yes: NPRS scale: current 1/10  Pain location: Lumbar spine through hips, buttocks to back of legs Pain description: burning, ache; tight; radicular pattern into legs  Aggravating factors: walking starts after ~400 ft, lifting; the more I do;  first thing in am Relieving factors: resting, heat  PRECAUTIONS: None  RED FLAGS: None   WEIGHT BEARING RESTRICTIONS: No  FALLS:  Has patient fallen in last 6 months? No  LIVING ENVIRONMENT: Lives with: lives alone Lives in: House/apartment Stairs: Yes: External: 3 steps; wall Has following equipment at home: None  OCCUPATION: Insurance  PLOF: Independent  PATIENT GOALS: decrease pain  NEXT MD VISIT: as needed  OBJECTIVE:  Note: Objective measures were completed at Evaluation unless otherwise noted.  DIAGNOSTIC FINDINGS:  LB Imaging 5/25: degenerative scoliosis as well as advanced degenerative disc disease with severe to space narrowing of all levels of the lumbar spine.   bilateral midfoot osteoarthritis; left hallux varus and right hallux valgus with bilateral 2nd hammertoes   PATIENT SURVEYS:  Modified Oswestry:  MODIFIED OSWESTRY DISABILITY SCALE  Date: 01/23/24 Score  Pain intensity 1 = The pain is bad, but I can manage without having to take (1) I can stand as long as I want but, it increases my pain. pain medication.  2. Personal care (washing, dressing, etc.) 1 =  I can take care of myself normally, but it increases my pain.  3. Lifting 1 = I can lift heavy weights, but it causes increased pain.  4. Walking 2 =  Pain prevents me from walking more than  mile.  5. Sitting 1 =  I can only sit in my favorite chair as long as I like.  6. Standing 3 =  Pain prevents me from standing more than 1/2 hour.  7. Sleeping 0 = Pain does not prevent me from sleeping well.  8. Social Life 2 = Pain prevents me from participating in more energetic activities (eg. sports, dancing).  9. Traveling 0 =  I can travel anywhere without increased pain.  10. Employment/ Homemaking 2 = I can perform most of my homemaking/job duties, but pain prevents me from performing more physically stressful activities (eg, lifting, vacuuming).  Total 11/50=22%   Interpretation of scores: Score  Category Description  0-20% Minimal Disability The patient can cope with most living activities. Usually no treatment is indicated apart from advice on lifting, sitting and exercise  21-40% Moderate Disability The patient experiences more pain and difficulty with sitting, lifting and standing. Travel and social life are more difficult and they may be disabled from work. Personal care, sexual activity and sleeping are not grossly affected, and the patient can usually be managed by conservative means  41-60% Severe Disability Pain remains the main problem in this group, but activities of daily living are affected. These patients require a detailed investigation  61-80% Crippled Back pain impinges on all aspects of the patient's life. Positive intervention is required  81-100%  Bed-bound  These patients are either bed-bound or exaggerating their symptoms  Bluford FORBES Zoe DELENA Karon DELENA, et al. Surgery versus conservative management of stable thoracolumbar fracture: the PRESTO feasibility RCT. [place]): [employer]; 2021 Nov. [facility] Technology Assessment, No. 25.62.) Appendix 3, Oswestry Disability Index category descriptors. Available from: [employer]el[employer]cz  Minimally Clinically Important Difference (MCID) = 12.8%  COGNITION: Overall cognitive status: Within functional limits for tasks assessed     SENSATION: WFL   POSTURE: rounded shoulders, forward head, right pelvic obliquity, and weight shift right  PALPATION: No TTP  LUMBAR ROM:   AROM eval 02/18/24  Flexion FT mid shin   Extension Just to neutral No change  Right lateral flexion 25% limited 25% limited  Left lateral flexion 75% limited 60% limited  Right rotation    Left rotation     (Blank rows = not tested)  LOWER EXTREMITY ROM:     wfl  LOWER EXTREMITY MMT:    MMT Right eval Left eval  Hip flexion 31.2 33.3  Hip extension    Hip abduction 17.6 19.6  Hip adduction    Hip  internal rotation    Hip external rotation    Knee flexion    Knee extension 47.2 28.3  Ankle dorsiflexion    Ankle plantarflexion    Ankle inversion    Ankle eversion     (Blank rows = not tested)  LUMBAR SPECIAL TESTS:  Straight leg raise test: Negative, Slump test: Negative,  and Trendelenburg sign: Negative  FUNCTIONAL TESTS:  5 times sit to stand: 15.36 TUG: 13.52 4 stage balance test: passed 1&2.  Tandem needs asssitance to gain position holds for 3s; SLS x 5s  GAIT: Distance walked: 400 ft Assistive device utilized: None Level of assistance: Complete Independence Comments: trunk rotation right  TREATMENT  [facility] Adult PT Treatment:                                             Date: 02/26/24 Pt seen for aquatic therapy today.  Treatment took place in water  3.5-4.75 ft in depth at the [facility] pool. Temp of water  was 91.  Pt entered/exited the pool via stairs independently in step-to pattern (using RLE) with bil rail.  - unsupported walking forward/ backward - cues for even step length, - suitcase carry with bil and single yellow hand float at side, marching forward backward  - side stepping with arm add/abdct with yellow hand floats demonstration needed for execution -yellow noodle pull down for TrA engagement wide stance then staggered x 10 -standing ue support yellow noodle 4.34ft: df; pf; hip flex/ext alternating x 10; hip add/abd crossing midline alternating x 5 (good challenge) -sitting balance on yellow noodle: raising ue individually then bilaterally out of water ->hands on lap. -straddling noodle: cycling;hip add/abd and flex/ext -step ups onto bottom step leading L then R x 5-10 -STS from 3rd water  step x2 x 5.  VC for proper execution.       PATIENT EDUCATION:  Education details: intro to aquatic therapy Person educated: Patient Education method: Explanation Education comprehension: verbalized understanding  HOME EXERCISE PROGRAM: HB  carry Figure 4 stretch Step ups on bottom step  ASSESSMENT:  CLINICAL IMPRESSION: Pt has initiated completion of aquatic exercises between sessions.  He reports low pain sensitivity today. Worked core strengthening through balance challenges with good toleration.  He losses  balance a few times but recovers indep.  Cues for core engagement improves positioning and ability to balance.       From initial evaluation:  Patient is a 73 y.o. m who was seen today for physical therapy evaluation and treatment for chronic LBP due to Lumbar spondylosis. Patient presents with pain limited deficits in lumbar spine, ROM, endurance, activity tolerance, gait, balance, and functional mobility with ADL's. His pain sensitivity is moderate with activity with radicular pain into LE L>R daily but not continuously aligning with dx. Patient is having to modify amb time/distance to avoid increase in pain symptoms.  His goals are to improve his LB dysfunction in preparation for left foot surgery.  OBJECTIVE IMPAIRMENTS: Abnormal gait, decreased activity tolerance, decreased mobility, difficulty walking, decreased ROM, decreased strength, postural dysfunction, obesity, and pain.   ACTIVITY LIMITATIONS: carrying, lifting, sitting, standing, squatting, stairs, transfers, and locomotion level  PARTICIPATION LIMITATIONS: cleaning, community activity, and yard work  PERSONAL FACTORS: Fitness and Time since onset of injury/illness/exacerbation are also affecting patient's functional outcome.   REHAB POTENTIAL: Good  CLINICAL DECISION MAKING: Stable/uncomplicated  EVALUATION COMPLEXITY: Low   GOALS: Goals reviewed with patient? Yes  SHORT TERM GOALS: Target date: 02/22/24  Pt will tolerate full aquatic sessions consistently without increase in pain and with improving function to demonstrate good toleration and effectiveness of intervention.  Baseline: Goal status: MET - 02/15/24  2.  Pt will improve lumbar  ROM Baseline: see chart Goal status: in progress 02/18/24   LONG TERM GOALS: Target date: 04/04/24  Pt to improve on ODI by 13-15% (MCID) to demonstrate statistically significant Improvement in function. Baseline: 11/50=22% Goal status: INITIAL  2.  Pt will report decrease in pain by at least 50% for improved toleration to activity/quality of life and to demonstrate improved management of pain. Baseline: see chart Goal status: INITIAL  3.  Pt will tolerate stair climbing using alternating pattern ascending and descending 6 steps with use of handrail Baseline: step to Goal status: INITIAL  4.  Pt will report toleration to community amb without limitation to pain Baseline:  Goal status: INITIAL  5.  Pt will improve on 5 X STS test to <or=11 to demonstrate improving functional lower extremity strength, transitional movements, and balance. (MDC = 4.2sec)  Baseline: 15.36 Goal status: INITIAL  6.  Pt will be indep with final HEP's (land and aquatic as appropriate) for continued management of condition Baseline:  Goal status: INITIAL  PLAN:  PT FREQUENCY: 1-2x/week  PT DURATION: 10 weeks extended out 2 weeks due to scheduling conflicts; 16 visits  PLANNED INTERVENTIONS: [id number]- PT Re-evaluation, 97750- Physical Performance Testing, 97110-Therapeutic exercises, 97530- Therapeutic activity, [id number]- Neuromuscular re-education, 97535- Self Care, [id number]- Manual therapy, [id number]- Gait training, [phone]- Aquatic Therapy, [phone]- Electrical stimulation (manual), [id number]- Traction (mechanical), 20560 (1-2 muscles), 20561 (3+ muscles)- Dry Needling, Patient/Family education, Balance training, Stair training, Taping, Joint mobilization, DME instructions, Cryotherapy, and Moist heat.  PLAN FOR NEXT SESSION: Aquatic and land: core and LE strengthening and stretching; posture realignment, pain management; gait, balance and proprioception re-training.  Ronal [place]) Margeaux Swantek MPT 02/26/24 4:40 PM [facility] [address] [place], [place], [address] Phone: [phone]   Fax:  [phone]

## 2024-02-28 ENCOUNTER — Ambulatory Visit (HOSPITAL_BASED_OUTPATIENT_CLINIC_OR_DEPARTMENT_OTHER): Admitting: Physical Therapy

## 2024-02-28 ENCOUNTER — Other Ambulatory Visit (INDEPENDENT_AMBULATORY_CARE_PROVIDER_SITE_OTHER): Payer: Self-pay

## 2024-02-28 ENCOUNTER — Encounter (HOSPITAL_BASED_OUTPATIENT_CLINIC_OR_DEPARTMENT_OTHER): Payer: Self-pay | Admitting: Physical Therapy

## 2024-02-28 DIAGNOSIS — M5459 Other low back pain: Secondary | ICD-10-CM | POA: Diagnosis not present

## 2024-02-28 DIAGNOSIS — R262 Difficulty in walking, not elsewhere classified: Secondary | ICD-10-CM

## 2024-02-28 DIAGNOSIS — M6281 Muscle weakness (generalized): Secondary | ICD-10-CM | POA: Diagnosis not present

## 2024-02-28 DIAGNOSIS — R293 Abnormal posture: Secondary | ICD-10-CM

## 2024-02-28 MED ORDER — DORZOLAMIDE HCL-TIMOLOL MAL 2-0.5 % OP SOLN: 1.0000 [drp] | Freq: Every day | OPHTHALMIC | 0 refills | Status: DC

## 2024-02-28 NOTE — Therapy (Signed)
 OUTPATIENT PHYSICAL THERAPY THORACOLUMBAR TREATMENT   Patient Name: Samuel [name] MRN: [id number] DOB:[birth year], 73 y.o., male Today's Date: 02/29/2024  END OF SESSION:  PT End of Session - 02/28/24 1507     Visit Number 8    Number of Visits 16    Date for PT Re-Evaluation 04/04/24    Authorization Type MCR    PT Start Time 1500    PT Stop Time 1550    PT Time Calculation (min) 50 min    Activity Tolerance Patient tolerated treatment well    Behavior During Therapy [facility] for tasks assessed/performed            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   severe- right anterior shoulder- (EXC)   Atypical nevus 07/18/2011   mild-mod-right upper abdomen   Back pain    Basal cell carcinoma 07/17/2019   nod-left forearm-([id number])   COVID 01/07/2021   EPISTAXIS, RECURRENT 12/21/2009   ESSENTIAL HYPERTENSION 12/21/2009   Hypertension    Joint pain    Other fatigue    Prediabetes    Retinal detachment    OD   Shortness of breath on exertion    VENTRICULAR HYPERTROPHY, LEFT 01/04/2010   Past Surgical History:  Procedure Laterality Date   CATARACT EXTRACTION Bilateral 2009   EYE SURGERY Bilateral 2009   cataract   GAS/FLUID EXCHANGE Right 09/26/2018   Procedure: Gas/Fluid Exchange;  Surgeon: Valdemar Rogue, MD;  Location: [facility] OR;  Service: Ophthalmology;  Laterality: Right;   PHOTOCOAGULATION WITH LASER Right 09/26/2018   Procedure: Photocoagulation With Laser;  Surgeon: Valdemar Rogue, MD;  Location: [facility] OR;  Service: Ophthalmology;  Laterality: Right;   SCLERAL BUCKLE Right 09/26/2018   Procedure: Scleral Buckle;  Surgeon: Valdemar Rogue, MD;  Location: [facility] OR;  Service: Ophthalmology;  Laterality: Right;   TOTAL HIP ARTHROPLASTY Right 02/16/2022   VITRECTOMY 25 GAUGE WITH SCLERAL BUCKLE Right 09/26/2018   Procedure: VITRECTOMY 25 GAUGE;  Surgeon: Valdemar Rogue, MD;  Location: [facility] OR;   Service: Ophthalmology;  Laterality: Right;   Patient Active Problem List   Diagnosis Date Noted   SOB (shortness of breath) on exertion 12/20/2021   Chronic osteoarthritis, Hip 11/21/2021   Pre-diabetes 11/21/2021   Urinary frequency 11/21/2021   Dehydration, mild 08/11/2021   Class 2 severe obesity with serious comorbidity and body mass index (BMI) of 39.0 to 39.9 in adult (HCC) 07/21/2021   NAFLD (nonalcoholic fatty liver disease) 98/87/7976   Hyperlipidemia 03/01/2021   Prediabetes 07/17/2017   VENTRICULAR HYPERTROPHY, LEFT 01/04/2010   Essential hypertension 12/21/2009   EPISTAXIS, RECURRENT 12/21/2009   ABNORMAL ELECTROCARDIOGRAM 12/21/2009    PCP: Wolm Baize MD  REFERRING PROVIDER: Donaciano Sprang MD  REFERRING DIAG: M54.50 (ICD-10-CM) - Low back pain, unspecified   Rationale for Evaluation and Treatment: Rehabilitation  THERAPY DIAG:  Other low back pain  Abnormal posture  Muscle weakness (generalized)  Difficulty in walking, not elsewhere classified  ONSET DATE:  a few years  SUBJECTIVE:  SUBJECTIVE STATEMENT: Pt states the aquatic therapy is helping.  He continues to have stiffness though is better overall.  Pt reports improved sciatic pain.  Pt states his lower back feels like a vice grip if he is bending over for a few mins.  Pt reports having pain 1st thing in AM and typically worse in the AM.  Pt states he was bent over rolling up his towel after prior aquatic Rx and reports having tightness when walking to the car.  Pt states he didn't think the tightness was from the aquatic exercises, but from rolling his towel up when he was bent over after Rx.  Pt has come to the pool on his own and performed the aquatic exercises.      5/30 MD note--Imaging today of the low back  shows degenerative scoliosis as well as advanced degenerative disc disease with severe to space narrowing of all levels of the lumbar spine.   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    From initial evaluation:  Pt reports LBP x a couple of years. Left THR x 2 years ago with good recovery.  I have a right foot issue and may be having surgery.  I still work, my lb is priority for me right now.    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 Yes: NPRS scale: current .5/10  Pain location: bilat buttocks  Pain description: burning, ache; tight; radicular pattern into legs  Aggravating factors: walking starts after ~400 ft, lifting; the more I do; first thing in am Relieving factors: resting, heat  PRECAUTIONS: None  RED FLAGS: None   WEIGHT BEARING RESTRICTIONS: No  FALLS:  Has patient fallen in last 6 months? No  LIVING ENVIRONMENT: Lives with: lives alone Lives in: House/apartment Stairs: Yes: External: 3 steps; wall Has following equipment at home: None  OCCUPATION: Insurance  PLOF: Independent  PATIENT GOALS: decrease pain  NEXT MD VISIT: as needed  OBJECTIVE:  Note: Objective measures were completed at Evaluation unless otherwise noted.  DIAGNOSTIC FINDINGS:  LB Imaging 5/25: degenerative scoliosis as well as advanced degenerative disc disease with severe to space narrowing of all levels of the lumbar spine.   bilateral midfoot osteoarthritis; left hallux varus and right hallux valgus with bilateral 2nd hammertoes   PATIENT SURVEYS:  Modified Oswestry:  MODIFIED OSWESTRY DISABILITY SCALE  Date: 01/23/24 Score  Pain intensity 1 = The pain is bad, but I can manage without having to take (1) I can stand as long as I want but, it increases my pain. pain medication.  2. Personal care (washing, dressing, etc.) 1 =  I can take care of myself normally, but it increases my pain.  3. Lifting 1 = I can lift heavy  weights, but it causes increased pain.  4. Walking 2 =  Pain prevents me from walking more than  mile.  5. Sitting 1 =  I can only sit in my favorite chair as long as I like.  6. Standing 3 =  Pain prevents me from standing more than 1/2 hour.  7. Sleeping 0 = Pain does not prevent me from sleeping well.  8. Social Life 2 = Pain prevents me from participating in more energetic activities (eg. sports, dancing).  9. Traveling 0 =  I can travel anywhere without increased pain.  10. Employment/ Homemaking 2 = I can perform most of my homemaking/job duties, but pain  prevents me from performing more physically stressful activities (eg, lifting, vacuuming).  Total 11/50=22%   Interpretation of scores: Score Category Description  0-20% Minimal Disability The patient can cope with most living activities. Usually no treatment is indicated apart from advice on lifting, sitting and exercise  21-40% Moderate Disability The patient experiences more pain and difficulty with sitting, lifting and standing. Travel and social life are more difficult and they may be disabled from work. Personal care, sexual activity and sleeping are not grossly affected, and the patient can usually be managed by conservative means  41-60% Severe Disability Pain remains the main problem in this group, but activities of daily living are affected. These patients require a detailed investigation  61-80% Crippled Back pain impinges on all aspects of the patient's life. Positive intervention is required  81-100% Bed-bound  These patients are either bed-bound or exaggerating their symptoms  Bluford FORBES Zoe DELENA Karon DELENA, et al. Surgery versus conservative management of stable thoracolumbar fracture: the PRESTO feasibility RCT. [place] ([place]): [employer]; 2021 Nov. [facility] Technology Assessment, No. 25.62.) Appendix 3, Oswestry Disability Index category descriptors. Available from:  FindJewel[employer].cz  Minimally Clinically Important Difference (MCID) = 12.8%  COGNITION: Overall cognitive status: Within functional limits for tasks assessed     SENSATION: WFL   POSTURE: rounded shoulders, forward head, right pelvic obliquity, and weight shift right  PALPATION: No TTP  LUMBAR ROM:   AROM eval 02/18/24  Flexion FT mid shin   Extension Just to neutral No change  Right lateral flexion 25% limited 25% limited  Left lateral flexion 75% limited 60% limited  Right rotation    Left rotation     (Blank rows = not tested)  LOWER EXTREMITY ROM:     wfl  LOWER EXTREMITY MMT:    MMT Right eval Left eval  Hip flexion 31.2 33.3  Hip extension    Hip abduction 17.6 19.6  Hip adduction    Hip internal rotation    Hip external rotation    Knee flexion    Knee extension 47.2 28.3  Ankle dorsiflexion    Ankle plantarflexion    Ankle inversion    Ankle eversion     (Blank rows = not tested)  LUMBAR SPECIAL TESTS:  Straight leg raise test: Negative, Slump test: Negative,  and Trendelenburg sign: Negative  FUNCTIONAL TESTS:  5 times sit to stand: 15.36 TUG: 13.52 4 stage balance test: passed 1&2.  Tandem needs asssitance to gain position holds for 3s; SLS x 5s  GAIT: Distance walked: 400 ft Assistive device utilized: None Level of assistance: Complete Independence Comments: trunk rotation right  TREATMENT: Reviewed pt presentation, response to prior Rx, and pain level.   PT instructed pt in correct performance and palpation of TrA contraction in supine.  Pt performed supine TrA contraction with and without 5 sec hold with PT instruction and cuing.  Pt unable to perform supine bridge. PPT in supine Supine marching with TrA Seated hip abd with RTB x 10 reps and GTB  LAQ approx 12 Seated HS stretch 2x20 sec bilat Supine manual HS stretch x20 sec bilat Standing rows with RTB x 10 reps Seated rows with RTB x 10  Pt  received a HEP handout and was educated in correct form and appropriate frequency.  PT instructed pt he should not have pain with HEP.      PATIENT EDUCATION:  Education details:  exercise form, rationale of interventions, HEP, and POC Person educated: Patient Education method:  Explanation, demonstration, verbal and tactile cues, handout Education comprehension: verbalized understanding, returned demonstration, verbal and tactile cues required  HOME EXERCISE PROGRAM: HB carry Figure 4 stretch Step ups on bottom step  Access Code: [facility] URL: [email or web address] Date: 02/28/2024 Prepared by: Mose Minerva  Exercises - Supine Transversus Abdominis Bracing - Hands on Stomach  - 2 x daily - 7 x weekly - 2 sets - 10 reps - 5 seconds hold - Seated Hip Abduction with Resistance  - 1 x daily - 4 x weekly - 2 sets - 10 reps - Seated Shoulder Row with Anchored Resistance  - 1 x daily - 4-5 x weekly - 2 sets - 10 reps  ASSESSMENT:  CLINICAL IMPRESSION: Pt states aquatic therapy is helping.  He has been going to the pool on his own to perform aquatic exercises.  PT established HEP and gave pt a HEP handout.   PT educated and instructed pt in correct performance and palpation of TrA contraction and educated pt concerning the role of TrA in spinal stabilization.  He was unable to perform bridge.  Pt is limited in exercises due to pain.  Pt had decreased ROM and difficulty with performing supine PPT.  Pt had difficulty with correct form with seated HS stretch.  Pt had better posture and performance with seated rows than standing rows.  Pt responded well to Rx having no c/o's after Rx.  He should benefit from cont skilled PT to improve core strength, address goals and improve overall function.      OBJECTIVE IMPAIRMENTS: Abnormal gait, decreased activity tolerance, decreased mobility, difficulty walking, decreased ROM, decreased strength, postural dysfunction, obesity, and pain.    ACTIVITY LIMITATIONS: carrying, lifting, sitting, standing, squatting, stairs, transfers, and locomotion level  PARTICIPATION LIMITATIONS: cleaning, community activity, and yard work  PERSONAL FACTORS: Fitness and Time since onset of injury/illness/exacerbation are also affecting patient's functional outcome.   REHAB POTENTIAL: Good  CLINICAL DECISION MAKING: Stable/uncomplicated  EVALUATION COMPLEXITY: Low   GOALS: Goals reviewed with patient? Yes  SHORT TERM GOALS: Target date: 02/22/24  Pt will tolerate full aquatic sessions consistently without increase in pain and with improving function to demonstrate good toleration and effectiveness of intervention.  Baseline: Goal status: MET - 02/15/24  2.  Pt will improve lumbar ROM Baseline: see chart Goal status: in progress 02/18/24   LONG TERM GOALS: Target date: 04/04/24  Pt to improve on ODI by 13-15% (MCID) to demonstrate statistically significant Improvement in function. Baseline: 11/50=22% Goal status: INITIAL  2.  Pt will report decrease in pain by at least 50% for improved toleration to activity/quality of life and to demonstrate improved management of pain. Baseline: see chart Goal status: INITIAL  3.  Pt will tolerate stair climbing using alternating pattern ascending and descending 6 steps with use of handrail Baseline: step to Goal status: INITIAL  4.  Pt will report toleration to community amb without limitation to pain Baseline:  Goal status: INITIAL  5.  Pt will improve on 5 X STS test to <or=11 to demonstrate improving functional lower extremity strength, transitional movements, and balance. (MDC = 4.2sec)  Baseline: 15.36 Goal status: INITIAL  6.  Pt will be indep with final HEP's (land and aquatic as appropriate) for continued management of condition Baseline:  Goal status: INITIAL  PLAN:  PT FREQUENCY: 1-2x/week  PT DURATION: 10 weeks extended out 2 weeks due to scheduling conflicts; 16  visits  PLANNED INTERVENTIONS: 97164- PT Re-evaluation, 97750- Physical Performance Testing, 97110-Therapeutic exercises, 97530-  Therapeutic activity, [id number]- Neuromuscular re-education, [id number]- Self Care, [id number]- Manual therapy, [id number]- Gait training, [id number]- Aquatic Therapy, [phone]- Electrical stimulation (manual), [id number]- Traction (mechanical), [phone] (1-2 muscles), [id number] (3+ muscles)- Dry Needling, Patient/Family education, Balance training, Stair training, Taping, Joint mobilization, DME instructions, Cryotherapy, and Moist heat.  PLAN FOR NEXT SESSION: Aquatic and land: core and LE strengthening and stretching; posture realignment, pain management; gait, balance and proprioception re-training.  Leigh Minerva III PT, DPT 02/29/24 4:03 PM  [facility] [address] [place], [place], [address] Phone: [phone]   Fax:  [phone]

## 2024-03-06 ENCOUNTER — Ambulatory Visit (HOSPITAL_BASED_OUTPATIENT_CLINIC_OR_DEPARTMENT_OTHER): Admitting: Physical Therapy

## 2024-03-06 ENCOUNTER — Encounter (HOSPITAL_BASED_OUTPATIENT_CLINIC_OR_DEPARTMENT_OTHER): Payer: Self-pay | Admitting: Physical Therapy

## 2024-03-06 DIAGNOSIS — R293 Abnormal posture: Secondary | ICD-10-CM

## 2024-03-06 DIAGNOSIS — R262 Difficulty in walking, not elsewhere classified: Secondary | ICD-10-CM

## 2024-03-06 DIAGNOSIS — M6281 Muscle weakness (generalized): Secondary | ICD-10-CM

## 2024-03-06 DIAGNOSIS — M5459 Other low back pain: Secondary | ICD-10-CM | POA: Diagnosis not present

## 2024-03-06 NOTE — Therapy (Signed)
 OUTPATIENT PHYSICAL THERAPY THORACOLUMBAR TREATMENT   Patient Name: Samuel [name] MRN: [id number] DOB:28-Aug-1950, 73 y.o., male Today's Date: 03/07/2024  END OF SESSION:  PT End of Session - 03/06/24 1413     Visit Number 9    Number of Visits 16    Date for PT Re-Evaluation 04/04/24    Authorization Type MCR    PT Start Time 1407    PT Stop Time 1453    PT Time Calculation (min) 46 min    Activity Tolerance Patient tolerated treatment well    Behavior During Therapy [facility] for tasks assessed/performed            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   severe- right anterior shoulder- (EXC)   Atypical nevus 07/18/2011   mild-mod-right upper abdomen   Back pain    Basal cell carcinoma 07/17/2019   nod-left forearm-([id number])   COVID 01/07/2021   EPISTAXIS, RECURRENT 12/21/2009   ESSENTIAL HYPERTENSION 12/21/2009   Hypertension    Joint pain    Other fatigue    Prediabetes    Retinal detachment    OD   Shortness of breath on exertion    VENTRICULAR HYPERTROPHY, LEFT 01/04/2010   Past Surgical History:  Procedure Laterality Date   CATARACT EXTRACTION Bilateral 2009   EYE SURGERY Bilateral 2009   cataract   GAS/FLUID EXCHANGE Right 09/26/2018   Procedure: Gas/Fluid Exchange;  Surgeon: Valdemar Rogue, MD;  Location: [facility] OR;  Service: Ophthalmology;  Laterality: Right;   PHOTOCOAGULATION WITH LASER Right 09/26/2018   Procedure: Photocoagulation With Laser;  Surgeon: Valdemar Rogue, MD;  Location: [facility] OR;  Service: Ophthalmology;  Laterality: Right;   SCLERAL BUCKLE Right 09/26/2018   Procedure: Scleral Buckle;  Surgeon: Valdemar Rogue, MD;  Location: [facility] OR;  Service: Ophthalmology;  Laterality: Right;   TOTAL HIP ARTHROPLASTY Right 02/16/2022   VITRECTOMY 25 GAUGE WITH SCLERAL BUCKLE Right 09/26/2018   Procedure: VITRECTOMY 25 GAUGE;  Surgeon: Valdemar Rogue, MD;  Location: [facility] OR;   Service: Ophthalmology;  Laterality: Right;   Patient Active Problem List   Diagnosis Date Noted   SOB (shortness of breath) on exertion 12/20/2021   Chronic osteoarthritis, Hip 11/21/2021   Pre-diabetes 11/21/2021   Urinary frequency 11/21/2021   Dehydration, mild 08/11/2021   Class 2 severe obesity with serious comorbidity and body mass index (BMI) of 39.0 to 39.9 in adult (HCC) 07/21/2021   NAFLD (nonalcoholic fatty liver disease) 98/87/7976   Hyperlipidemia 03/01/2021   Prediabetes 07/17/2017   VENTRICULAR HYPERTROPHY, LEFT 01/04/2010   Essential hypertension 12/21/2009   EPISTAXIS, RECURRENT 12/21/2009   ABNORMAL ELECTROCARDIOGRAM 12/21/2009    PCP: Wolm Baize MD  REFERRING PROVIDER: Donaciano Sprang MD  REFERRING DIAG: M54.50 (ICD-10-CM) - Low back pain, unspecified   Rationale for Evaluation and Treatment: Rehabilitation  THERAPY DIAG:  Other low back pain  Abnormal posture  Muscle weakness (generalized)  Difficulty in walking, not elsewhere classified  ONSET DATE:  a few years  SUBJECTIVE:  SUBJECTIVE STATEMENT: Pt denies any adverse effects after prior Rx.  Pt states he lost the HEP handout.  He remembered the TrA contraction exercise and has been doing that exercise.  Pt states he is in the busy season of his work. Pt reports having tightness in lumbar and tension in post thigh and buttocks with ambulating 5-10 mins.  He has pain and tightness walking across the parking lot.  I know the therapy has done some good.  Pt states he has not been having the intense sciatic pain.     5/30 MD note--Imaging today of the low back shows degenerative scoliosis as well as advanced degenerative disc disease with severe to space narrowing of all levels of the lumbar spine.   POOL ACCESS:  at apartment complex and is now a member of [employer].    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 No pain in sitting Pain location: lumbar, bilat buttocks  Pain description: burning, ache; tight; radicular pattern into legs  Aggravating factors: walking starts after ~400 ft, lifting; the more I do; first thing in am Relieving factors: resting, heat  PRECAUTIONS: None  RED FLAGS: None   WEIGHT BEARING RESTRICTIONS: No  FALLS:  Has patient fallen in last 6 months? No  LIVING ENVIRONMENT: Lives with: lives alone Lives in: House/apartment Stairs: Yes: External: 3 steps; wall Has following equipment at home: None  OCCUPATION: Insurance  PLOF: Independent  PATIENT GOALS: decrease pain  NEXT MD VISIT: as needed  OBJECTIVE:  Note: Objective measures were completed at Evaluation unless otherwise noted.  DIAGNOSTIC FINDINGS:  LB Imaging 5/25: degenerative scoliosis as well as advanced degenerative disc disease with severe to space narrowing of all levels of the lumbar spine.   bilateral midfoot osteoarthritis; left hallux varus and right hallux valgus with bilateral 2nd hammertoes   PATIENT SURVEYS:  Modified Oswestry:  MODIFIED OSWESTRY DISABILITY SCALE  Date: 01/23/24 Score  Pain intensity 1 = The pain is bad, but I can manage without having to take (1) I can stand as long as I want but, it increases my pain. pain medication.  2. Personal care (washing, dressing, etc.) 1 =  I can take care of myself normally, but it increases my pain.  3. Lifting 1 = I can lift heavy weights, but it causes increased pain.  4. Walking 2 =  Pain prevents me from walking more than  mile.  5. Sitting 1 =  I can only sit in my favorite chair as long as I like.  6. Standing 3 =  Pain prevents me from standing more than 1/2 hour.  7. Sleeping 0 = Pain does not prevent me from sleeping well.  8. Social Life 2 = Pain prevents me from participating in more  energetic activities (eg. sports, dancing).  9. Traveling 0 =  I can travel anywhere without increased pain.  10. Employment/ Homemaking 2 = I can perform most of my homemaking/job duties, but pain prevents me from performing more physically stressful activities (eg, lifting, vacuuming).  Total 11/50=22%   Interpretation of scores: Score Category Description  0-20% Minimal Disability The patient can cope with most living activities. Usually no treatment is indicated apart from advice on lifting, sitting and exercise  21-40% Moderate Disability The patient experiences more pain and difficulty with sitting, lifting and standing. Travel and social life are more difficult and they may be disabled from work. Personal care, sexual activity and sleeping  are not grossly affected, and the patient can usually be managed by conservative means  41-60% Severe Disability Pain remains the main problem in this group, but activities of daily living are affected. These patients require a detailed investigation  61-80% Crippled Back pain impinges on all aspects of the patient's life. Positive intervention is required  81-100% Bed-bound  These patients are either bed-bound or exaggerating their symptoms  Bluford FORBES Zoe DELENA Karon DELENA, et al. Surgery versus conservative management of stable thoracolumbar fracture: the PRESTO feasibility RCT. [place] ([place]): [employer]; 2021 Nov. [facility] [employer] Assessment, No. 25.62.) Appendix 3, Oswestry Disability Index category descriptors. Available from: FindJewel[employer].cz  Minimally Clinically Important Difference (MCID) = 12.8%  COGNITION: Overall cognitive status: Within functional limits for tasks assessed     SENSATION: WFL   POSTURE: rounded shoulders, forward head, right pelvic obliquity, and weight shift right  PALPATION: No TTP  LUMBAR ROM:   AROM eval 02/18/24  Flexion FT mid shin   Extension Just to neutral No  change  Right lateral flexion 25% limited 25% limited  Left lateral flexion 75% limited 60% limited  Right rotation    Left rotation     (Blank rows = not tested)  LOWER EXTREMITY ROM:     wfl  LOWER EXTREMITY MMT:    MMT Right eval Left eval  Hip flexion 31.2 33.3  Hip extension    Hip abduction 17.6 19.6  Hip adduction    Hip internal rotation    Hip external rotation    Knee flexion    Knee extension 47.2 28.3  Ankle dorsiflexion    Ankle plantarflexion    Ankle inversion    Ankle eversion     (Blank rows = not tested)  LUMBAR SPECIAL TESTS:  Straight leg raise test: Negative, Slump test: Negative,  and Trendelenburg sign: Negative  FUNCTIONAL TESTS:  5 times sit to stand: 15.36 TUG: 13.52 4 stage balance test: passed 1&2.  Tandem needs asssitance to gain position holds for 3s; SLS x 5s  GAIT: Distance walked: 400 ft Assistive device utilized: None Level of assistance: Complete Independence Comments: trunk rotation right  TREATMENT: Reviewed pt presentation, response to prior Rx, and pain level.   TrA contractions seated Seated rows with RTB with TrA 2x10, standing rows with TrA x10 Standing shoulder extension with TrA with RTB 2x15 Seated hip abd with GTB 3x10  LAQ approx 12 on R, 2x10 L   PPT in supine Supine marching with TrA  Manual Therapy:  STM to bilat lumbar paraspinals in L S/L'ing with pillow b/w knees      PATIENT EDUCATION:  Education details:  exercise form, rationale of interventions, HEP, and POC Person educated: Patient Education method: Explanation, demonstration, verbal and tactile cues, handout Education comprehension: verbalized understanding, returned demonstration, verbal and tactile cues required  HOME EXERCISE PROGRAM: HB carry Figure 4 stretch Step ups on bottom step  Access Code: [facility] URL: [email or web address] Date: 02/28/2024 Prepared by: Mose Minerva  Exercises - Supine Transversus  Abdominis Bracing - Hands on Stomach  - 2 x daily - 7 x weekly - 2 sets - 10 reps - 5 seconds hold - Seated Hip Abduction with Resistance  - 1 x daily - 4 x weekly - 2 sets - 10 reps - Seated Shoulder Row with Anchored Resistance  - 1 x daily - 4-5 x weekly - 2 sets - 10 reps  ASSESSMENT:  CLINICAL IMPRESSION: Pt reports that therapy is helping.  He is  not having the intense sciatic pain.  He is limited with ambulation having tightness and pain in lumbar.  He c/o's of lumbar pain and tightness walking across the parking lot.  Pt lost the land based HEP from last treatment and PT printed out another handout for him.  PT educated him in correct exercises, correct form and appropriate frequency.  Pt continued to have difficulty with performing supine PPT though improved with cuing and increased repetitions.  PT performed STM to bilat lumbar paraspinals to improve soft tissue tightness and mobility and pain.  He responded well to Rx having no increased pain after Rx.  He should benefit from cont skilled PT to improve core strength, address goals and improve overall function.      OBJECTIVE IMPAIRMENTS: Abnormal gait, decreased activity tolerance, decreased mobility, difficulty walking, decreased ROM, decreased strength, postural dysfunction, obesity, and pain.   ACTIVITY LIMITATIONS: carrying, lifting, sitting, standing, squatting, stairs, transfers, and locomotion level  PARTICIPATION LIMITATIONS: cleaning, community activity, and yard work  PERSONAL FACTORS: Fitness and Time since onset of injury/illness/exacerbation are also affecting patient's functional outcome.   REHAB POTENTIAL: Good  CLINICAL DECISION MAKING: Stable/uncomplicated  EVALUATION COMPLEXITY: Low   GOALS: Goals reviewed with patient? Yes  SHORT TERM GOALS: Target date: 02/22/24  Pt will tolerate full aquatic sessions consistently without increase in pain and with improving function to demonstrate good toleration and  effectiveness of intervention.  Baseline: Goal status: MET - 02/15/24  2.  Pt will improve lumbar ROM Baseline: see chart Goal status: in progress 02/18/24   LONG TERM GOALS: Target date: 04/04/24  Pt to improve on ODI by 13-15% (MCID) to demonstrate statistically significant Improvement in function. Baseline: 11/50=22% Goal status: INITIAL  2.  Pt will report decrease in pain by at least 50% for improved toleration to activity/quality of life and to demonstrate improved management of pain. Baseline: see chart Goal status: INITIAL  3.  Pt will tolerate stair climbing using alternating pattern ascending and descending 6 steps with use of handrail Baseline: step to Goal status: INITIAL  4.  Pt will report toleration to community amb without limitation to pain Baseline:  Goal status: INITIAL  5.  Pt will improve on 5 X STS test to <or=11 to demonstrate improving functional lower extremity strength, transitional movements, and balance. (MDC = 4.2sec)  Baseline: 15.36 Goal status: INITIAL  6.  Pt will be indep with final HEP's (land and aquatic as appropriate) for continued management of condition Baseline:  Goal status: INITIAL  PLAN:  PT FREQUENCY: 1-2x/week  PT DURATION: 10 weeks extended out 2 weeks due to scheduling conflicts; 16 visits  PLANNED INTERVENTIONS: [id number]- PT Re-evaluation, 97750- Physical Performance Testing, 97110-Therapeutic exercises, 97530- Therapeutic activity, [id number]- Neuromuscular re-education, 97535- Self Care, [id number]- Manual therapy, [id number]- Gait training, [phone]- Aquatic Therapy, [phone]- Electrical stimulation (manual), [id number]- Traction (mechanical), 20560 (1-2 muscles), 20561 (3+ muscles)- Dry Needling, Patient/Family education, Balance training, Stair training, Taping, Joint mobilization, DME instructions, Cryotherapy, and Moist heat.  PLAN FOR NEXT SESSION: Aquatic and land: core and LE strengthening and stretching; posture realignment, pain management; gait,  balance and proprioception re-training.  Leigh Minerva III PT, DPT 03/07/24 4:35 PM   [facility] [address] [place], [place], [address] Phone: [phone]   Fax:  [phone]

## 2024-03-11 ENCOUNTER — Encounter (HOSPITAL_BASED_OUTPATIENT_CLINIC_OR_DEPARTMENT_OTHER): Payer: Self-pay | Admitting: Physical Therapy

## 2024-03-11 ENCOUNTER — Ambulatory Visit (HOSPITAL_BASED_OUTPATIENT_CLINIC_OR_DEPARTMENT_OTHER): Attending: Orthopedic Surgery | Admitting: Physical Therapy

## 2024-03-11 DIAGNOSIS — R293 Abnormal posture: Secondary | ICD-10-CM | POA: Diagnosis present

## 2024-03-11 DIAGNOSIS — R262 Difficulty in walking, not elsewhere classified: Secondary | ICD-10-CM | POA: Insufficient documentation

## 2024-03-11 DIAGNOSIS — M5459 Other low back pain: Secondary | ICD-10-CM | POA: Diagnosis present

## 2024-03-11 DIAGNOSIS — M6281 Muscle weakness (generalized): Secondary | ICD-10-CM | POA: Insufficient documentation

## 2024-03-11 NOTE — Therapy (Signed)
 OUTPATIENT PHYSICAL THERAPY THORACOLUMBAR TREATMENT Progress Note Reporting Period 01/23/24 to 03/11/24  See note below for Objective Data and Assessment of Progress/Goals.      Patient Name: Samuel [name] MRN: [id number] DOB:July 31, 1950, 73 y.o., male Today's Date: 03/11/2024  END OF SESSION:  PT End of Session - 03/11/24 1619     Visit Number 10    Number of Visits 16    Date for PT Re-Evaluation 04/04/24    Authorization Type MCR    Progress Note Due on Visit 20    PT Start Time 1618    PT Stop Time 1700    PT Time Calculation (min) 42 min    Activity Tolerance Patient tolerated treatment well    Behavior During Therapy [facility] for tasks assessed/performed            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   severe- right anterior shoulder- (EXC)   Atypical nevus 07/18/2011   mild-mod-right upper abdomen   Back pain    Basal cell carcinoma 07/17/2019   nod-left forearm-([id number])   COVID 01/07/2021   EPISTAXIS, RECURRENT 12/21/2009   ESSENTIAL HYPERTENSION 12/21/2009   Hypertension    Joint pain    Other fatigue    Prediabetes    Retinal detachment    OD   Shortness of breath on exertion    VENTRICULAR HYPERTROPHY, LEFT 01/04/2010   Past Surgical History:  Procedure Laterality Date   CATARACT EXTRACTION Bilateral 2009   EYE SURGERY Bilateral 2009   cataract   GAS/FLUID EXCHANGE Right 09/26/2018   Procedure: Gas/Fluid Exchange;  Surgeon: Valdemar Rogue, MD;  Location: [facility] OR;  Service: Ophthalmology;  Laterality: Right;   PHOTOCOAGULATION WITH LASER Right 09/26/2018   Procedure: Photocoagulation With Laser;  Surgeon: Valdemar Rogue, MD;  Location: [facility] OR;  Service: Ophthalmology;  Laterality: Right;   SCLERAL BUCKLE Right 09/26/2018   Procedure: Scleral Buckle;  Surgeon: Valdemar Rogue, MD;  Location: [facility] OR;  Service: Ophthalmology;  Laterality: Right;   TOTAL HIP  ARTHROPLASTY Right 02/16/2022   VITRECTOMY 25 GAUGE WITH SCLERAL BUCKLE Right 09/26/2018   Procedure: VITRECTOMY 25 GAUGE;  Surgeon: Valdemar Rogue, MD;  Location: [facility] OR;  Service: Ophthalmology;  Laterality: Right;   Patient Active Problem List   Diagnosis Date Noted   SOB (shortness of breath) on exertion 12/20/2021   Chronic osteoarthritis, Hip 11/21/2021   Pre-diabetes 11/21/2021   Urinary frequency 11/21/2021   Dehydration, mild 08/11/2021   Class 2 severe obesity with serious comorbidity and body mass index (BMI) of 39.0 to 39.9 in adult (HCC) 07/21/2021   NAFLD (nonalcoholic fatty liver disease) 98/87/7976   Hyperlipidemia 03/01/2021   Prediabetes 07/17/2017   VENTRICULAR HYPERTROPHY, LEFT 01/04/2010   Essential hypertension 12/21/2009   EPISTAXIS, RECURRENT 12/21/2009   ABNORMAL ELECTROCARDIOGRAM 12/21/2009    PCP: Wolm Baize MD  REFERRING PROVIDER: Donaciano Sprang MD  REFERRING DIAG: M54.50 (ICD-10-CM) - Low back pain, unspecified   Rationale for Evaluation and Treatment: Rehabilitation  THERAPY DIAG:  Other low back pain  Abnormal posture  Muscle weakness (generalized)  ONSET DATE:  a few years  SUBJECTIVE:  SUBJECTIVE STATEMENT: Pt states pain is slightly higher today for no reason. 5/10 walking to setting     5/30 MD note--Imaging today of the low back shows degenerative scoliosis as well as advanced degenerative disc disease with severe to space narrowing of all levels of the lumbar spine.   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 No pain in sitting Pain location: lumbar, bilat buttocks  Pain description: burning, ache; tight; radicular pattern into legs   Aggravating factors: walking starts after ~400 ft, lifting; the more I do; first thing in am Relieving factors: resting, heat  PRECAUTIONS: None  RED FLAGS: None   WEIGHT BEARING RESTRICTIONS: No  FALLS:  Has patient fallen in last 6 months? No  LIVING ENVIRONMENT: Lives with: lives alone Lives in: House/apartment Stairs: Yes: External: 3 steps; wall Has following equipment at home: None  OCCUPATION: Insurance  PLOF: Independent  PATIENT GOALS: decrease pain  NEXT MD VISIT: as needed  OBJECTIVE:  Note: Objective measures were completed at Evaluation unless otherwise noted.  DIAGNOSTIC FINDINGS:  LB Imaging 5/25: degenerative scoliosis as well as advanced degenerative disc disease with severe to space narrowing of all levels of the lumbar spine.   bilateral midfoot osteoarthritis; left hallux varus and right hallux valgus with bilateral 2nd hammertoes   PATIENT SURVEYS:  Modified Oswestry:  MODIFIED OSWESTRY DISABILITY SCALE   Date: 01/23/24 Score 03/11/24  Pain intensity 1 = The pain is bad, but I can manage without having to take (1) I can stand as long as I want but, it increases my pain. pain medication. 0  2. Personal care (washing, dressing, etc.) 1 =  I can take care of myself normally, but it increases my pain. 1  3. Lifting 1 = I can lift heavy weights, but it causes increased pain. 3  4. Walking 2 =  Pain prevents me from walking more than  mile. 2  5. Sitting 1 =  I can only sit in my favorite chair as long as I like. 1  6. Standing 3 =  Pain prevents me from standing more than 1/2 hour. 4  7. Sleeping 0 = Pain does not prevent me from sleeping well. 0  8. Social Life 2 = Pain prevents me from participating in more energetic activities (eg. sports, dancing). 1  9. Traveling 0 =  I can travel anywhere without increased pain. 1  10. Employment/ Homemaking 2 = I can perform most of my homemaking/job duties, but pain prevents me from performing more physically  stressful activities (eg, lifting, vacuuming). 2  Total 11/50=22% 15/50=30%   Interpretation of scores: Score Category Description  0-20% Minimal Disability The patient can cope with most living activities. Usually no treatment is indicated apart from advice on lifting, sitting and exercise  21-40% Moderate Disability The patient experiences more pain and difficulty with sitting, lifting and standing. Travel and social life are more difficult and they may be disabled from work. Personal care, sexual activity and sleeping are not grossly affected, and the patient can usually be managed by conservative means  41-60% Severe Disability Pain remains the main problem in this group, but activities of daily living are affected. These patients require a detailed investigation  61-80% Crippled Back pain impinges on all aspects of the patient's life. Positive intervention is required  81-100% Bed-bound  These patients are  either bed-bound or exaggerating their symptoms  Bluford FORBES Zoe DELENA Karon DELENA, et al. Surgery versus conservative management of stable thoracolumbar fracture: the PRESTO feasibility RCT. [place] ([place]): [employer]; 2021 Nov. [facility] Technology Assessment, No. 25.62.) Appendix 3, Oswestry Disability Index category descriptors. Available from: FindJewel[employer].cz  Minimally Clinically Important Difference (MCID) = 12.8%  COGNITION: Overall cognitive status: Within functional limits for tasks assessed     SENSATION: WFL   POSTURE: rounded shoulders, forward head, right pelvic obliquity, and weight shift right  PALPATION: No TTP  LUMBAR ROM:   AROM eval 02/18/24 03/11/24  Flexion FT mid shin  full  Extension Just to neutral No change No change  Right lateral flexion 25% limited 25% limited full  Left lateral flexion 75% limited 60% limited 25% limited  Right rotation     Left rotation      (Blank rows = not tested)  LOWER EXTREMITY ROM:      wfl  LOWER EXTREMITY MMT:    MMT Right eval Left eval  Hip flexion 31.2 33.3  Hip extension    Hip abduction 17.6 19.6  Hip adduction    Hip internal rotation    Hip external rotation    Knee flexion    Knee extension 47.2 28.3  Ankle dorsiflexion    Ankle plantarflexion    Ankle inversion    Ankle eversion     (Blank rows = not tested)  LUMBAR SPECIAL TESTS:  Straight leg raise test: Negative, Slump test: Negative,  and Trendelenburg sign: Negative  FUNCTIONAL TESTS:  5 times sit to stand: 15.36 TUG: 13.52 4 stage balance test: passed 1&2.  Tandem needs asssitance to gain position holds for 3s; SLS x 5s     03/11/24: TUG 14.11 GAIT: Distance walked: 400 ft Assistive device utilized: None Level of assistance: Complete Independence Comments: trunk rotation right  TREATMENT:  Re-cert testing: TUG; lumbar ROM; balance  Date: 03/11/24 Pt seen for aquatic therapy today.  Treatment took place in water  3.5-4.75 ft in depth at the [facility] pool. Temp of water  was 91.  Pt entered/exited the pool via stairs independently in step-to pattern (using RLE) with bil rail.   - unsupported walking forward/ backward - cues for even step length, -step ups onto bottom step leading L then R x 5-10 -4.0 ft step downs left le only x 3 -yellow noodle pull down for TrA engagement wide stance then staggered x 10 - side stepping with arm add/abdct with yellow hand floats demonstration needed for execution -standing ue support yellow hand buoys 4.23ft: df; pf;  hip add/abd crossing midline alternating x 10 (improved) - suitcase carry with bil and single yellow hand float at side, marching forward backward  -straddling noodle: cycling;hip add/abd and flex/ext  Pt requires the buoyancy and hydrostatic pressure of water  for support, and to offload joints by unweighting joint load by at least 50 % in navel deep water  and by at least 75-80% in chest to neck deep water .  Viscosity of  the water  is needed for resistance of strengthening. Water  current perturbations provides challenge to standing balance requiring increased core activation.     Previous land session Reviewed pt presentation, response to prior Rx, and pain level.   TrA contractions seated Seated rows with RTB with TrA 2x10, standing rows with TrA x10 Standing shoulder extension with TrA with RTB 2x15 Seated hip abd with GTB 3x10  LAQ approx 12 on R, 2x10 L   PPT in supine Supine  marching with TrA  Manual Therapy:  STM to bilat lumbar paraspinals in L S/L'ing with pillow b/w knees      PATIENT EDUCATION:  Education details:  exercise form, rationale of interventions, HEP, and POC Person educated: Patient Education method: Explanation, demonstration, verbal and tactile cues, handout Education comprehension: verbalized understanding, returned demonstration, verbal and tactile cues required  HOME EXERCISE PROGRAM: HB carry Figure 4 stretch Step ups on bottom step  Access Code: [facility] URL: [email or web address] Date: 02/28/2024 Prepared by: Mose Minerva  Exercises - Supine Transversus Abdominis Bracing - Hands on Stomach  - 2 x daily - 7 x weekly - 2 sets - 10 reps - 5 seconds hold - Seated Hip Abduction with Resistance  - 1 x daily - 4 x weekly - 2 sets - 10 reps - Seated Shoulder Row with Anchored Resistance  - 1 x daily - 4-5 x weekly - 2 sets - 10 reps  ASSESSMENT:  CLINICAL IMPRESSION: PN: pt reports walking 150 yards [place] a [place] last [place].  Pain increased to 4-5/10 but resolved after a few minutes after sitting. His overall pain has reduced 25-30% with significant reduction in sciatic pain.  He reports some compliance with HEP but is concerned for the next 3 months he will have a difficult time completing due to busy time at work. His lumbar ROM has improved in all areas except extension. His ODI suggests a decline in function although this is likely  attributed to his reports of increased discomfort today.  Plan for aquatics to wrap up next session issuing a HEP.  He tolerates session well with added focus on left le strengthening.  He will continue to benefit from skilled PT intervention with full transition onto land for optimal strengthening of core and LE improving all function and meeting land based goals.     OBJECTIVE IMPAIRMENTS: Abnormal gait, decreased activity tolerance, decreased mobility, difficulty walking, decreased ROM, decreased strength, postural dysfunction, obesity, and pain.   ACTIVITY LIMITATIONS: carrying, lifting, sitting, standing, squatting, stairs, transfers, and locomotion level  PARTICIPATION LIMITATIONS: cleaning, community activity, and yard work  PERSONAL FACTORS: Fitness and Time since onset of injury/illness/exacerbation are also affecting patient's functional outcome.   REHAB POTENTIAL: Good  CLINICAL DECISION MAKING: Stable/uncomplicated  EVALUATION COMPLEXITY: Low   GOALS: Goals reviewed with patient? Yes  SHORT TERM GOALS: Target date: 02/22/24  Pt will tolerate full aquatic sessions consistently without increase in pain and with improving function to demonstrate good toleration and effectiveness of intervention.  Baseline: Goal status: MET - 02/15/24  2.  Pt will improve lumbar ROM Baseline: see chart Goal status: in progress 02/18/24; Met 03/11/24   LONG TERM GOALS: Target date: 04/04/24  Pt to improve on ODI by 13-15% (MCID) to demonstrate statistically significant Improvement in function. Baseline: 11/50=22%; 15/50=30% Goal status: In progress 03/11/24  2.  Pt will report decrease in pain by at least 50% for improved toleration to activity/quality of life and to demonstrate improved management of pain. Baseline: see chart Goal status: INITIAL  3.  Pt will tolerate stair climbing using alternating pattern ascending and descending 6 steps with use of handrail Baseline: step to Goal  status: INITIAL  4.  Pt will report toleration to community amb without limitation to pain Baseline:  Goal status: INITIAL  5.  Pt will improve on 5 X STS test to <or=11 to demonstrate improving functional lower extremity strength, transitional movements, and balance. (MDC = 4.2sec)  Baseline: 15.36; 14.11 Goal  status: In progress 03/11/24  6.  Pt will be indep with final HEP's (land and aquatic as appropriate) for continued management of condition Baseline:  Goal status: INITIAL  PLAN:  PT FREQUENCY: 1-2x/week  PT DURATION: 10 weeks extended out 2 weeks due to scheduling conflicts; 16 visits  PLANNED INTERVENTIONS: 97164- PT Re-evaluation, 97750- Physical Performance Testing, 97110-Therapeutic exercises, 97530- Therapeutic activity, [id number]- Neuromuscular re-education, 97535- Self Care, [id number]- Manual therapy, [id number]- Gait training, [phone]- Aquatic Therapy, [id number]- Electrical stimulation (manual), [id number]- Traction (mechanical), 20560 (1-2 muscles), 20561 (3+ muscles)- Dry Needling, Patient/Family education, Balance training, Stair training, Taping, Joint mobilization, DME instructions, Cryotherapy, and Moist heat.  PLAN FOR NEXT SESSION: Aquatic and land: core and LE strengthening and stretching; posture realignment, pain management; gait, balance and proprioception re-training.  [address] [place]) Josephine Rudnick MPT 03/11/24 5:07 PM [facility] [address] [place], [place], [address] Phone: [phone]   Fax:  [phone]    [facility] [address] [place], [place], [address] Phone: [phone]   Fax:  [phone]

## 2024-03-13 ENCOUNTER — Ambulatory Visit (HOSPITAL_BASED_OUTPATIENT_CLINIC_OR_DEPARTMENT_OTHER): Admitting: Physical Therapy

## 2024-03-13 ENCOUNTER — Encounter (HOSPITAL_BASED_OUTPATIENT_CLINIC_OR_DEPARTMENT_OTHER): Payer: Self-pay | Admitting: Physical Therapy

## 2024-03-13 DIAGNOSIS — M5459 Other low back pain: Secondary | ICD-10-CM

## 2024-03-13 DIAGNOSIS — M6281 Muscle weakness (generalized): Secondary | ICD-10-CM

## 2024-03-13 DIAGNOSIS — R262 Difficulty in walking, not elsewhere classified: Secondary | ICD-10-CM

## 2024-03-13 DIAGNOSIS — R293 Abnormal posture: Secondary | ICD-10-CM | POA: Diagnosis not present

## 2024-03-13 NOTE — Therapy (Signed)
 OUTPATIENT PHYSICAL THERAPY THORACOLUMBAR TREATMENT Progress Note Reporting Period 01/23/24 to 03/11/24  See note below for Objective Data and Assessment of Progress/Goals.      Patient Name: Samuel [name] MRN: [id number] DOB:06/12/1951, 73 y.o., male Today's Date: 03/13/2024  END OF SESSION:      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   severe- right anterior shoulder- (EXC)   Atypical nevus 07/18/2011   mild-mod-right upper abdomen   Back pain    Basal cell carcinoma 07/17/2019   nod-left forearm-([id number])   COVID 01/07/2021   EPISTAXIS, RECURRENT 12/21/2009   ESSENTIAL HYPERTENSION 12/21/2009   Hypertension    Joint pain    Other fatigue    Prediabetes    Retinal detachment    OD   Shortness of breath on exertion    VENTRICULAR HYPERTROPHY, LEFT 01/04/2010   Past Surgical History:  Procedure Laterality Date   CATARACT EXTRACTION Bilateral 2009   EYE SURGERY Bilateral 2009   cataract   GAS/FLUID EXCHANGE Right 09/26/2018   Procedure: Gas/Fluid Exchange;  Surgeon: Valdemar Rogue, MD;  Location: [facility] OR;  Service: Ophthalmology;  Laterality: Right;   PHOTOCOAGULATION WITH LASER Right 09/26/2018   Procedure: Photocoagulation With Laser;  Surgeon: Valdemar Rogue, MD;  Location: [facility] OR;  Service: Ophthalmology;  Laterality: Right;   SCLERAL BUCKLE Right 09/26/2018   Procedure: Scleral Buckle;  Surgeon: Valdemar Rogue, MD;  Location: [facility] OR;  Service: Ophthalmology;  Laterality: Right;   TOTAL HIP ARTHROPLASTY Right 02/16/2022   VITRECTOMY 25 GAUGE WITH SCLERAL BUCKLE Right 09/26/2018   Procedure: VITRECTOMY 25 GAUGE;  Surgeon: Valdemar Rogue, MD;  Location: [facility] OR;  Service: Ophthalmology;  Laterality: Right;   Patient Active Problem List   Diagnosis Date Noted   SOB (shortness of breath) on exertion 12/20/2021   Chronic osteoarthritis, Hip 11/21/2021   Pre-diabetes 11/21/2021    Urinary frequency 11/21/2021   Dehydration, mild 08/11/2021   Class 2 severe obesity with serious comorbidity and body mass index (BMI) of 39.0 to 39.9 in adult (HCC) 07/21/2021   NAFLD (nonalcoholic fatty liver disease) 98/87/7976   Hyperlipidemia 03/01/2021   Prediabetes 07/17/2017   VENTRICULAR HYPERTROPHY, LEFT 01/04/2010   Essential hypertension 12/21/2009   EPISTAXIS, RECURRENT 12/21/2009   ABNORMAL ELECTROCARDIOGRAM 12/21/2009    PCP: Wolm Baize MD  REFERRING PROVIDER: Donaciano Sprang MD  REFERRING DIAG: M54.50 (ICD-10-CM) - Low back pain, unspecified   Rationale for Evaluation and Treatment: Rehabilitation  THERAPY DIAG:  No diagnosis found.  ONSET DATE:  a few years  SUBJECTIVE:  SUBJECTIVE STATEMENT: He's hurting more today.  He was havign a 5-6/10 pain earlier today and now 3-4/10 pain.   Pt had soreness after prior aquatic treatment and a little fatigue.  Pt states he felt ok after land based appt last week.   Pt states pain is slightly higher today for no reason. 5/10 walking to setting     5/30 MD note--Imaging today of the low back shows degenerative scoliosis as well as advanced degenerative disc disease with severe to space narrowing of all levels of the lumbar spine.   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    PERTINENT HISTORY:  OA feet R THR 02/16/22    PAIN:  NPRS:  3-4/10 Pain location: tightness in lumbar, pain in bilat buttocks posterior to bilat knees  Pain description: dull, ache; tight; intermittent numbness in L LE  Aggravating factors: walking starts after ~400 ft, lifting; the more I do; first thing in am Relieving factors: resting, heat  PRECAUTIONS: None  RED FLAGS: None   WEIGHT BEARING RESTRICTIONS: No  FALLS:   Has patient fallen in last 6 months? No  LIVING ENVIRONMENT: Lives with: lives alone Lives in: House/apartment Stairs: Yes: External: 3 steps; wall Has following equipment at home: None  OCCUPATION: Insurance  PLOF: Independent  PATIENT GOALS: decrease pain  NEXT MD VISIT: as needed  OBJECTIVE:  Note: Objective measures were completed at Evaluation unless otherwise noted.  DIAGNOSTIC FINDINGS:  LB Imaging 5/25: degenerative scoliosis as well as advanced degenerative disc disease with severe to space narrowing of all levels of the lumbar spine.   bilateral midfoot osteoarthritis; left hallux varus and right hallux valgus with bilateral 2nd hammertoes   PATIENT SURVEYS:  Modified Oswestry:  MODIFIED OSWESTRY DISABILITY SCALE   Date: 01/23/24 Score 03/11/24  Pain intensity 1 = The pain is bad, but I can manage without having to take (1) I can stand as long as I want but, it increases my pain. pain medication. 0  2. Personal care (washing, dressing, etc.) 1 =  I can take care of myself normally, but it increases my pain. 1  3. Lifting 1 = I can lift heavy weights, but it causes increased pain. 3  4. Walking 2 =  Pain prevents me from walking more than  mile. 2  5. Sitting 1 =  I can only sit in my favorite chair as long as I like. 1  6. Standing 3 =  Pain prevents me from standing more than 1/2 hour. 4  7. Sleeping 0 = Pain does not prevent me from sleeping well. 0  8. Social Life 2 = Pain prevents me from participating in more energetic activities (eg. sports, dancing). 1  9. Traveling 0 =  I can travel anywhere without increased pain. 1  10. Employment/ Homemaking 2 = I can perform most of my homemaking/job duties, but pain prevents me from performing more physically stressful activities (eg, lifting, vacuuming). 2  Total 11/50=22% 15/50=30%   Interpretation of scores: Score Category Description  0-20% Minimal Disability The patient can cope with most living activities.  Usually no treatment is indicated apart from advice on lifting, sitting and exercise  21-40% Moderate Disability The patient experiences more pain and difficulty with sitting, lifting and standing. Travel and social life are more difficult and they may be disabled from work. Personal care, sexual activity and sleeping are not grossly affected, and the patient can usually be managed by conservative means  41-60% Severe Disability Pain remains the main problem in this group, but activities of daily living are affected. These patients require a detailed investigation  61-80% Crippled Back pain impinges on all aspects of the patient's life. Positive intervention is required  81-100% Bed-bound  These patients are either bed-bound or exaggerating their symptoms  Bluford FORBES Zoe DELENA Karon DELENA, et al. Surgery versus conservative management of stable thoracolumbar fracture: the PRESTO feasibility RCT. [place] ([place]): [employer]; 2021 Nov. [facility] Assessment, No. 25.62.) Appendix 3, Oswestry Disability Index category descriptors. Available from: FindJewel[employer].cz  Minimally Clinically Important Difference (MCID) = 12.8%  COGNITION: Overall cognitive status: Within functional limits for tasks assessed     SENSATION: WFL   POSTURE: rounded shoulders, forward head, right pelvic obliquity, and weight shift right  PALPATION: No TTP  LUMBAR ROM:   AROM eval 02/18/24 03/11/24  Flexion FT mid shin  full  Extension Just to neutral No change No change  Right lateral flexion 25% limited 25% limited full  Left lateral flexion 75% limited 60% limited 25% limited  Right rotation     Left rotation      (Blank rows = not tested)  LOWER EXTREMITY ROM:     wfl  LOWER EXTREMITY MMT:    MMT Right eval Left eval  Hip flexion 31.2 33.3  Hip extension    Hip abduction 17.6 19.6  Hip adduction    Hip internal rotation    Hip external rotation    Knee  flexion    Knee extension 47.2 28.3  Ankle dorsiflexion    Ankle plantarflexion    Ankle inversion    Ankle eversion     (Blank rows = not tested)  LUMBAR SPECIAL TESTS:  Straight leg raise test: Negative, Slump test: Negative,  and Trendelenburg sign: Negative  FUNCTIONAL TESTS:  5 times sit to stand: 15.36 TUG: 13.52 4 stage balance test: passed 1&2.  Tandem needs asssitance to gain position holds for 3s; SLS x 5s     03/11/24: TUG 14.11 GAIT: Distance walked: 400 ft Assistive device utilized: None Level of assistance: Complete Independence Comments: trunk rotation right  TREATMENT: Reviewed pt presentation, response to prior Rx, and pain level.   TrA contractions seated Seated rows with RTB with TrA 2x10, standing rows with TrA x10   PPT in supine Supine marching with TrA Supine manual HS stretch with passive AP's for nn flossing 2x30 sec bilat  LAQ with AP's 2x10 LAQ with GTB x 10 bilat, 3# 2 x 10 L LE Standing rows with RTB and with GTB approx 12-15 each Standing shoulder extension with TrA with RTB 2x15  Manual Therapy:  STM to bilat lumbar paraspinals in L S/L'ing with pillow b/w knees  514      PATIENT EDUCATION:  Education details:  exercise form, rationale of interventions, HEP, and POC Person educated: Patient Education method: Explanation, demonstration, verbal and tactile cues, handout Education comprehension: verbalized understanding, returned demonstration, verbal and tactile cues required  HOME EXERCISE PROGRAM: HB carry Figure 4 stretch Step ups on bottom step  Access Code: [facility] URL: [email or web address] Date: 02/28/2024 Prepared by: Mose Minerva  Exercises - Supine Transversus Abdominis Bracing - Hands on Stomach  - 2 x daily - 7 x weekly - 2 sets - 10 reps - 5 seconds hold - Seated Hip Abduction with Resistance  - 1 x daily - 4 x weekly - 2 sets - 10 reps - Seated Shoulder Row with Anchored Resistance  -  1 x daily -  4-5 x weekly - 2 sets - 10 reps  ASSESSMENT:  CLINICAL IMPRESSION: PN: pt reports walking 150 yards [place].  Pain increased to 4-5/10 but resolved after a few minutes after sitting. His overall pain has reduced 25-30% with significant reduction in sciatic pain.  He reports some compliance with HEP but is concerned for the next 3 months he will have a difficult time completing due to busy time at work. His lumbar ROM has improved in all areas except extension. His ODI suggests a decline in function although this is likely attributed to his reports of increased discomfort today.  Plan for aquatics to wrap up next session issuing a HEP.  He tolerates session well with added focus on left le strengthening.  He will continue to benefit from skilled PT intervention with full transition onto land for optimal strengthening of core and LE improving all function and meeting land based goals.  1-2/10     OBJECTIVE IMPAIRMENTS: Abnormal gait, decreased activity tolerance, decreased mobility, difficulty walking, decreased ROM, decreased strength, postural dysfunction, obesity, and pain.   ACTIVITY LIMITATIONS: carrying, lifting, sitting, standing, squatting, stairs, transfers, and locomotion level  PARTICIPATION LIMITATIONS: cleaning, community activity, and yard work  PERSONAL FACTORS: Fitness and Time since onset of injury/illness/exacerbation are also affecting patient's functional outcome.   REHAB POTENTIAL: Good  CLINICAL DECISION MAKING: Stable/uncomplicated  EVALUATION COMPLEXITY: Low   GOALS: Goals reviewed with patient? Yes  SHORT TERM GOALS: Target date: 02/22/24  Pt will tolerate full aquatic sessions consistently without increase in pain and with improving function to demonstrate good toleration and effectiveness of intervention.  Baseline: Goal status: MET - 02/15/24  2.  Pt will improve lumbar ROM Baseline: see chart Goal status: in progress 02/18/24;  Met 03/11/24   LONG TERM GOALS: Target date: 04/04/24  Pt to improve on ODI by 13-15% (MCID) to demonstrate statistically significant Improvement in function. Baseline: 11/50=22%; 15/50=30% Goal status: In progress 03/11/24  2.  Pt will report decrease in pain by at least 50% for improved toleration to activity/quality of life and to demonstrate improved management of pain. Baseline: see chart Goal status: INITIAL  3.  Pt will tolerate stair climbing using alternating pattern ascending and descending 6 steps with use of handrail Baseline: step to Goal status: INITIAL  4.  Pt will report toleration to community amb without limitation to pain Baseline:  Goal status: INITIAL  5.  Pt will improve on 5 X STS test to <or=11 to demonstrate improving functional lower extremity strength, transitional movements, and balance. (MDC = 4.2sec)  Baseline: 15.36; 14.11 Goal status: In progress 03/11/24  6.  Pt will be indep with final HEP's (land and aquatic as appropriate) for continued management of condition Baseline:  Goal status: INITIAL  PLAN:  PT FREQUENCY: 1-2x/week  PT DURATION: 10 weeks extended out 2 weeks due to scheduling conflicts; 16 visits  PLANNED INTERVENTIONS: [id number]- PT Re-evaluation, 97750- Physical Performance Testing, 97110-Therapeutic exercises, 97530- Therapeutic activity, [id number]- Neuromuscular re-education, 97535- Self Care, [id number]- Manual therapy, [id number]- Gait training, [phone]- Aquatic Therapy, [phone]- Electrical stimulation (manual), [id number]- Traction (mechanical), 20560 (1-2 muscles), 20561 (3+ muscles)- Dry Needling, Patient/Family education, Balance training, Stair training, Taping, Joint mobilization, DME instructions, Cryotherapy, and Moist heat.  PLAN FOR NEXT SESSION: Aquatic and land: core and LE strengthening and stretching; posture realignment, pain management; gait, balance and proprioception re-training.  Ronal Dyess) Ziemba MPT 03/13/24 4:34 PM [facility]  [facility] [address] [place], [place], [address] Phone: [phone]   Fax:  [phone]    [facility] [address] [place], [place], [address] Phone: [phone]   Fax:  [phone]

## 2024-03-14 ENCOUNTER — Encounter (HOSPITAL_BASED_OUTPATIENT_CLINIC_OR_DEPARTMENT_OTHER): Payer: Self-pay | Admitting: Physical Therapy

## 2024-03-18 ENCOUNTER — Encounter (HOSPITAL_BASED_OUTPATIENT_CLINIC_OR_DEPARTMENT_OTHER): Payer: Self-pay | Admitting: Physical Therapy

## 2024-03-18 ENCOUNTER — Ambulatory Visit (HOSPITAL_BASED_OUTPATIENT_CLINIC_OR_DEPARTMENT_OTHER): Admitting: Physical Therapy

## 2024-03-18 DIAGNOSIS — R262 Difficulty in walking, not elsewhere classified: Secondary | ICD-10-CM | POA: Diagnosis not present

## 2024-03-18 DIAGNOSIS — M6281 Muscle weakness (generalized): Secondary | ICD-10-CM | POA: Diagnosis not present

## 2024-03-18 DIAGNOSIS — M5459 Other low back pain: Secondary | ICD-10-CM | POA: Diagnosis not present

## 2024-03-18 DIAGNOSIS — R293 Abnormal posture: Secondary | ICD-10-CM

## 2024-03-18 NOTE — Therapy (Signed)
 OUTPATIENT PHYSICAL THERAPY THORACOLUMBAR TREATMENT     Patient Name: Samuel [name] MRN: [id number] DOB:1950/12/19, 73 y.o., male Today's Date: 03/18/2024  END OF SESSION:  PT End of Session - 03/18/24 1559     Visit Number 12    Number of Visits 16    Date for PT Re-Evaluation 04/04/24    Authorization Type MCR    PT Start Time 1559    PT Stop Time 1640    PT Time Calculation (min) 41 min    Activity Tolerance Patient tolerated treatment well    Behavior During Therapy [facility] for tasks assessed/performed              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   severe- right anterior shoulder- (EXC)   Atypical nevus 07/18/2011   mild-mod-right upper abdomen   Back pain    Basal cell carcinoma 07/17/2019   nod-left forearm-([id number])   COVID 01/07/2021   EPISTAXIS, RECURRENT 12/21/2009   ESSENTIAL HYPERTENSION 12/21/2009   Hypertension    Joint pain    Other fatigue    Prediabetes    Retinal detachment    OD   Shortness of breath on exertion    VENTRICULAR HYPERTROPHY, LEFT 01/04/2010   Past Surgical History:  Procedure Laterality Date   CATARACT EXTRACTION Bilateral 2009   EYE SURGERY Bilateral 2009   cataract   GAS/FLUID EXCHANGE Right 09/26/2018   Procedure: Gas/Fluid Exchange;  Surgeon: Valdemar Rogue, MD;  Location: [facility] OR;  Service: Ophthalmology;  Laterality: Right;   PHOTOCOAGULATION WITH LASER Right 09/26/2018   Procedure: Photocoagulation With Laser;  Surgeon: Valdemar Rogue, MD;  Location: [facility] OR;  Service: Ophthalmology;  Laterality: Right;   SCLERAL BUCKLE Right 09/26/2018   Procedure: Scleral Buckle;  Surgeon: Valdemar Rogue, MD;  Location: [facility] OR;  Service: Ophthalmology;  Laterality: Right;   TOTAL HIP ARTHROPLASTY Right 02/16/2022   VITRECTOMY 25 GAUGE WITH SCLERAL BUCKLE Right 09/26/2018   Procedure: VITRECTOMY 25 GAUGE;  Surgeon: Valdemar Rogue, MD;   Location: [facility] OR;  Service: Ophthalmology;  Laterality: Right;   Patient Active Problem List   Diagnosis Date Noted   SOB (shortness of breath) on exertion 12/20/2021   Chronic osteoarthritis, Hip 11/21/2021   Pre-diabetes 11/21/2021   Urinary frequency 11/21/2021   Dehydration, mild 08/11/2021   Class 2 severe obesity with serious comorbidity and body mass index (BMI) of 39.0 to 39.9 in adult (HCC) 07/21/2021   NAFLD (nonalcoholic fatty liver disease) 98/87/7976   Hyperlipidemia 03/01/2021   Prediabetes 07/17/2017   VENTRICULAR HYPERTROPHY, LEFT 01/04/2010   Essential hypertension 12/21/2009   EPISTAXIS, RECURRENT 12/21/2009   ABNORMAL ELECTROCARDIOGRAM 12/21/2009    PCP: Wolm Baize MD  REFERRING PROVIDER: Donaciano Sprang MD  REFERRING DIAG: M54.50 (ICD-10-CM) - Low back pain, unspecified   Rationale for Evaluation and Treatment: Rehabilitation  THERAPY DIAG:  Other low back pain  Abnormal posture  Muscle weakness (generalized)  Difficulty in walking, not elsewhere classified  ONSET DATE:  a few years  SUBJECTIVE:  SUBJECTIVE STATEMENT: Had a massage end of last week and my pain was real high afterward.  May have been because I was lying on my stomach for about an hour. Pain today 2/10    5/30 MD note--Imaging today of the low back shows degenerative scoliosis as well as advanced degenerative disc disease with severe to space narrowing of all levels of the lumbar spine.   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    PERTINENT HISTORY:  OA feet R THR 02/16/22    PAIN:  NPRS:  3-4/10 Pain location: tightness in lumbar, pain in bilat buttocks posterior to bilat knees  Pain description: dull, ache; tight; intermittent numbness in L LE  Aggravating  factors: walking starts after ~400 ft, lifting; the more I do; first thing in am Relieving factors: resting, heat  PRECAUTIONS: None  RED FLAGS: None   WEIGHT BEARING RESTRICTIONS: No  FALLS:  Has patient fallen in last 6 months? No  LIVING ENVIRONMENT: Lives with: lives alone Lives in: House/apartment Stairs: Yes: External: 3 steps; wall Has following equipment at home: None  OCCUPATION: Insurance  PLOF: Independent  PATIENT GOALS: decrease pain  NEXT MD VISIT: as needed  OBJECTIVE:  Note: Objective measures were completed at Evaluation unless otherwise noted.  DIAGNOSTIC FINDINGS:  LB Imaging 5/25: degenerative scoliosis as well as advanced degenerative disc disease with severe to space narrowing of all levels of the lumbar spine.   bilateral midfoot osteoarthritis; left hallux varus and right hallux valgus with bilateral 2nd hammertoes   PATIENT SURVEYS:  Modified Oswestry:  MODIFIED OSWESTRY DISABILITY SCALE   Date: 01/23/24 Score 03/11/24  Pain intensity 1 = The pain is bad, but I can manage without having to take (1) I can stand as long as I want but, it increases my pain. pain medication. 0  2. Personal care (washing, dressing, etc.) 1 =  I can take care of myself normally, but it increases my pain. 1  3. Lifting 1 = I can lift heavy weights, but it causes increased pain. 3  4. Walking 2 =  Pain prevents me from walking more than  mile. 2  5. Sitting 1 =  I can only sit in my favorite chair as long as I like. 1  6. Standing 3 =  Pain prevents me from standing more than 1/2 hour. 4  7. Sleeping 0 = Pain does not prevent me from sleeping well. 0  8. Social Life 2 = Pain prevents me from participating in more energetic activities (eg. sports, dancing). 1  9. Traveling 0 =  I can travel anywhere without increased pain. 1  10. Employment/ Homemaking 2 = I can perform most of my homemaking/job duties, but pain prevents me from performing more physically stressful  activities (eg, lifting, vacuuming). 2  Total 11/50=22% 15/50=30%   Interpretation of scores: Score Category Description  0-20% Minimal Disability The patient can cope with most living activities. Usually no treatment is indicated apart from advice on lifting, sitting and exercise  21-40% Moderate Disability The patient experiences more pain and difficulty with sitting, lifting and standing. Travel and social life are more difficult and they may be disabled from work. Personal care, sexual activity and sleeping are not grossly affected, and the patient can usually be managed by conservative means  41-60% Severe Disability Pain remains the main problem in this group, but activities of daily living are affected. These patients require a detailed investigation  61-80% Crippled  Back pain impinges on all aspects of the patient's life. Positive intervention is required  81-100% Bed-bound  These patients are either bed-bound or exaggerating their symptoms  Bluford FORBES Zoe DELENA Karon DELENA, et al. Surgery versus conservative management of stable thoracolumbar fracture: the PRESTO feasibility RCT. [place] ([place]): [employer]; 2021 Nov. [facility] Technology Assessment, No. 25.62.) Appendix 3, Oswestry Disability Index category descriptors. Available from: FindJewel[employer].cz  Minimally Clinically Important Difference (MCID) = 12.8%  COGNITION: Overall cognitive status: Within functional limits for tasks assessed     SENSATION: WFL   POSTURE: rounded shoulders, forward head, right pelvic obliquity, and weight shift right  PALPATION: No TTP  LUMBAR ROM:   AROM eval 02/18/24 03/11/24  Flexion FT mid shin  full  Extension Just to neutral No change No change  Right lateral flexion 25% limited 25% limited full  Left lateral flexion 75% limited 60% limited 25% limited  Right rotation     Left rotation      (Blank rows = not tested)  LOWER EXTREMITY ROM:      wfl  LOWER EXTREMITY MMT:    MMT Right eval Left eval  Hip flexion 31.2 33.3  Hip extension    Hip abduction 17.6 19.6  Hip adduction    Hip internal rotation    Hip external rotation    Knee flexion    Knee extension 47.2 28.3  Ankle dorsiflexion    Ankle plantarflexion    Ankle inversion    Ankle eversion     (Blank rows = not tested)  LUMBAR SPECIAL TESTS:  Straight leg raise test: Negative, Slump test: Negative,  and Trendelenburg sign: Negative  FUNCTIONAL TESTS:  5 times sit to stand: 15.36 TUG: 13.52 4 stage balance test: passed 1&2.  Tandem needs asssitance to gain position holds for 3s; SLS x 5s     03/11/24: TUG 14.11 GAIT: Distance walked: 400 ft Assistive device utilized: None Level of assistance: Complete Independence Comments: trunk rotation right  TREATMENT: Date: 03/18/24 Pt seen for aquatic therapy today.  Treatment took place in water  3.5-4.75 ft in depth at the [facility] pool. Temp of water  was 91.  Pt entered/exited the pool via stairs independently in step-to pattern (using RLE) with bil rail.   - unsupported walking forward/ backward - cues for even step length, - side stepping with arm add/abdct with yellow hand floats demonstration needed for execution -L stretch; hip hiking - suitcase carry with bil and single yellow hand float at side, marching forward backward  yellow noodle pull down for TrA engagement wide stance then staggered x 10 -standing horizontal add/abd wide stance -[place] -Plank on bench: hip ext; fire hydrant x 5-7 -standing ue support yellow hand buoys 4.15ft: df; pf;  hip add/abd crossing midline alternating x 10; toe and heel raises -straddling noodle: cycling; ue support corner wall:hip add/abd and flex/ext -stair negotiation leading R then left, entire 6 steps.  Pt unable to alternate due to LLE weakness.    Pt requires the buoyancy and hydrostatic pressure of water  for support, and to offload joints by  unweighting joint load by at least 50 % in navel deep water  and by at least 75-80% in chest to neck deep water .  Viscosity of the water  is needed for resistance of strengthening. Water  current perturbations provides challenge to standing balance requiring increased core activation.   Reviewed pt presentation, response to prior Rx, and pain level.    PPT in supine Supine marching with  TrA Supine manual HS stretch with passive AP's for nn flossing 2x30 sec bilat  LAQ with AP's 2x10 LAQ with GTB x 10 bilat, 3# 2 x 10 L LE Standing rows with RTB and with GTB approx 12-15 each Standing shoulder extension with TrA with RTB 2x15  Manual Therapy:  STM to bilat lumbar paraspinals and R glute in L S/L'ing with pillow b/w knees     PATIENT EDUCATION:  Education details:  exercise form, rationale of interventions, HEP, and POC Person educated: Patient Education method: Explanation, demonstration, verbal and tactile cues, handout Education comprehension: verbalized understanding, returned demonstration, verbal and tactile cues required  HOME EXERCISE PROGRAM: Aquatic This aquatic home exercise program from [employer] utilizes pictures from land based exercises, but has been adapted prior to lamination and issuance.   Access Code: [email or web address] URL: [email or web address] Date: 03/18/2024 Prepared by: Frankie Bunyan Brier  Exercises - Hand buoy carry: Forward and Backward; bilaterally->unilaterally  - 1 x daily - 1-3 x weekly - Side Stepping with Hand Floats  - 1 x daily - 1-3 x weekly - Standing 'L' Stretch at [employer]  - 1 x daily - 1-3 x weekly - 1 sets - 3 reps - Leg swings flex/ext  - 1 x daily - 1-3 x weekly - 1-2 sets - 10 reps - Leg Swing out to the side  - 1 x daily - 1-3 x weekly - 1-2 sets - 10 reps - Heel Toe Raises at Pool Wall  - 1 x daily - 1-3 x weekly - 1-2 sets - 10 reps - Noodle press  - 1 x daily - 1-3 x weekly - 1-2 sets - 10 reps - Sitting Balance on Pool Noodle  - 1  x daily - 1-3 x weekly - Sit to Stand Without Arm Support  - 1 x daily - 1-3 x weekly - 1-2 sets - 10 reps - Water  Step Up on Bottom Step  - 1 x daily - 1-3 x weekly - 1-2 sets - 10 reps    Access Code: [facility] URL: [email or web address] Date: 02/28/2024 Prepared by: Mose Minerva  Exercises - Supine Transversus Abdominis Bracing - Hands on Stomach  - 2 x daily - 7 x weekly - 2 sets - 10 reps - 5 seconds hold - Seated Hip Abduction with Resistance  - 1 x daily - 4 x weekly - 2 sets - 10 reps - Seated Shoulder Row with Anchored Resistance  - 1 x daily - 4-5 x weekly - 2 sets - 10 reps  ASSESSMENT:  CLINICAL IMPRESSION: Pt reports odd feeling in LE's from hip to feet with movement in pool 1/10 discomfort but different than discomfort in hip out of the water . It increases with increased depth so likely caused by the viscosity and hydrostatic properties of the water . Does not increase nor decrease throughout session.  Focus on core strength. Cues for abd bracing throughout. He tolerates all activities well without any increase in pain sensitivity although also without reduction. He does have left hamstring cramp after cycling at end of session.  He is encouraged to increase his hydration. HEP created but not issued. He will benefit from 1 more aquatic session if availability presents itself for instruction. Would need to switch 1 land appt for aquatic. I anticipate he will be ready for DC at end of cert. Land based PT will determine.     OBJECTIVE IMPAIRMENTS: Abnormal gait, decreased activity tolerance, decreased mobility, difficulty walking, decreased ROM, decreased strength, postural dysfunction, obesity,  and pain.   ACTIVITY LIMITATIONS: carrying, lifting, sitting, standing, squatting, stairs, transfers, and locomotion level  PARTICIPATION LIMITATIONS: cleaning, community activity, and yard work  PERSONAL FACTORS: Fitness and Time since onset of  injury/illness/exacerbation are also affecting patient's functional outcome.   REHAB POTENTIAL: Good  CLINICAL DECISION MAKING: Stable/uncomplicated  EVALUATION COMPLEXITY: Low   GOALS: Goals reviewed with patient? Yes  SHORT TERM GOALS: Target date: 02/22/24  Pt will tolerate full aquatic sessions consistently without increase in pain and with improving function to demonstrate good toleration and effectiveness of intervention.  Baseline: Goal status: MET - 02/15/24  2.  Pt will improve lumbar ROM Baseline: see chart Goal status: in progress 02/18/24; Met 03/11/24   LONG TERM GOALS: Target date: 04/04/24  Pt to improve on ODI by 13-15% (MCID) to demonstrate statistically significant Improvement in function. Baseline: 11/50=22%; 15/50=30% Goal status: In progress 03/11/24  2.  Pt will report decrease in pain by at least 50% for improved toleration to activity/quality of life and to demonstrate improved management of pain. Baseline: see chart Goal status: INITIAL  3.  Pt will tolerate stair climbing using alternating pattern ascending and descending 6 steps with use of handrail Baseline: step to Goal status: In progress 03/18/24  4.  Pt will report toleration to community amb without limitation to pain Baseline:  Goal status: INITIAL  5.  Pt will improve on 5 X STS test to <or=11 to demonstrate improving functional lower extremity strength, transitional movements, and balance. (MDC = 4.2sec)  Baseline: 15.36; 14.11 Goal status: In progress 03/11/24  6.  Pt will be indep with final HEP's (land and aquatic as appropriate) for continued management of condition Baseline:  Goal status: In progress 03/18/24  PLAN:  PT FREQUENCY: 1-2x/week  PT DURATION: 10 weeks extended out 2 weeks due to scheduling conflicts; 16 visits  PLANNED INTERVENTIONS: [id number]- PT Re-evaluation, 97750- Physical Performance Testing, 97110-Therapeutic exercises, 97530- Therapeutic activity, [id number]- Neuromuscular  re-education, 97535- Self Care, [id number]- Manual therapy, [id number]- Gait training, [phone]- Aquatic Therapy, [phone]- Electrical stimulation (manual), [id number]- Traction (mechanical), [phone] (1-2 muscles), [id number] (3+ muscles)- Dry Needling, Patient/Family education, Balance training, Stair training, Taping, Joint mobilization, DME instructions, Cryotherapy, and Moist heat.  PLAN FOR NEXT SESSION: Aquatic : instruct in HEP.  Land: core and LE strengthening and stretching; posture realignment, pain management; gait, balance and proprioception re-training.  [address] [place]) Rohin Krejci MPT 03/18/24 4:00 PM [facility] [address] [place], [place], [address] Phone: [phone]   Fax:  [phone]

## 2024-03-20 ENCOUNTER — Encounter (HOSPITAL_BASED_OUTPATIENT_CLINIC_OR_DEPARTMENT_OTHER): Payer: Self-pay | Admitting: Physical Therapy

## 2024-03-20 ENCOUNTER — Ambulatory Visit (HOSPITAL_BASED_OUTPATIENT_CLINIC_OR_DEPARTMENT_OTHER): Admitting: Physical Therapy

## 2024-03-20 DIAGNOSIS — R293 Abnormal posture: Secondary | ICD-10-CM | POA: Diagnosis not present

## 2024-03-20 DIAGNOSIS — M5459 Other low back pain: Secondary | ICD-10-CM

## 2024-03-20 DIAGNOSIS — M6281 Muscle weakness (generalized): Secondary | ICD-10-CM | POA: Diagnosis not present

## 2024-03-20 DIAGNOSIS — R262 Difficulty in walking, not elsewhere classified: Secondary | ICD-10-CM

## 2024-03-20 NOTE — Therapy (Unsigned)
 OUTPATIENT PHYSICAL THERAPY THORACOLUMBAR TREATMENT     Patient Name: Samuel [name] MRN: [id number] DOB:Sep 20, 1950, 73 y.o., male Today's Date: 03/21/2024  END OF SESSION:  PT End of Session - 03/20/24 1616     Visit Number 13    Number of Visits 16    Date for PT Re-Evaluation 04/04/24    Authorization Type MCR    PT Start Time 1615    PT Stop Time 1657    PT Time Calculation (min) 42 min    Activity Tolerance Patient tolerated treatment well    Behavior During Therapy [facility] for tasks assessed/performed              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   severe- right anterior shoulder- (EXC)   Atypical nevus 07/18/2011   mild-mod-right upper abdomen   Back pain    Basal cell carcinoma 07/17/2019   nod-left forearm-([id number])   COVID 01/07/2021   EPISTAXIS, RECURRENT 12/21/2009   ESSENTIAL HYPERTENSION 12/21/2009   Hypertension    Joint pain    Other fatigue    Prediabetes    Retinal detachment    OD   Shortness of breath on exertion    VENTRICULAR HYPERTROPHY, LEFT 01/04/2010   Past Surgical History:  Procedure Laterality Date   CATARACT EXTRACTION Bilateral 2009   EYE SURGERY Bilateral 2009   cataract   GAS/FLUID EXCHANGE Right 09/26/2018   Procedure: Gas/Fluid Exchange;  Surgeon: Valdemar Rogue, MD;  Location: [facility] OR;  Service: Ophthalmology;  Laterality: Right;   PHOTOCOAGULATION WITH LASER Right 09/26/2018   Procedure: Photocoagulation With Laser;  Surgeon: Valdemar Rogue, MD;  Location: [facility] OR;  Service: Ophthalmology;  Laterality: Right;   SCLERAL BUCKLE Right 09/26/2018   Procedure: Scleral Buckle;  Surgeon: Valdemar Rogue, MD;  Location: [facility] OR;  Service: Ophthalmology;  Laterality: Right;   TOTAL HIP ARTHROPLASTY Right 02/16/2022   VITRECTOMY 25 GAUGE WITH SCLERAL BUCKLE Right 09/26/2018   Procedure: VITRECTOMY 25 GAUGE;  Surgeon: Valdemar Rogue, MD;   Location: [facility] OR;  Service: Ophthalmology;  Laterality: Right;   Patient Active Problem List   Diagnosis Date Noted   SOB (shortness of breath) on exertion 12/20/2021   Chronic osteoarthritis, Hip 11/21/2021   Pre-diabetes 11/21/2021   Urinary frequency 11/21/2021   Dehydration, mild 08/11/2021   Class 2 severe obesity with serious comorbidity and body mass index (BMI) of 39.0 to 39.9 in adult (HCC) 07/21/2021   NAFLD (nonalcoholic fatty liver disease) 98/87/7976   Hyperlipidemia 03/01/2021   Prediabetes 07/17/2017   VENTRICULAR HYPERTROPHY, LEFT 01/04/2010   Essential hypertension 12/21/2009   EPISTAXIS, RECURRENT 12/21/2009   ABNORMAL ELECTROCARDIOGRAM 12/21/2009    PCP: Wolm Baize MD  REFERRING PROVIDER: Donaciano Sprang MD  REFERRING DIAG: M54.50 (ICD-10-CM) - Low back pain, unspecified   Rationale for Evaluation and Treatment: Rehabilitation  THERAPY DIAG:  Other low back pain  Abnormal posture  Muscle weakness (generalized)  Difficulty in walking, not elsewhere classified  ONSET DATE:  a few years  SUBJECTIVE:  SUBJECTIVE STATEMENT: Pt denies any adverse effects after prior Rx.  Pt states he could feel his legs work while walking in the pool.  He saw the massage therapist last week and states he was aching a lot after the massage.    5/30 MD note--Imaging today of the low back shows degenerative scoliosis as well as advanced degenerative disc disease with severe to space narrowing of all levels of the lumbar spine.   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    PERTINENT HISTORY:  OA feet R THR 02/16/22    PAIN:  NPRS:  1/10 Pain location:  L glute and L HS  Pain description: dull, ache; tight; intermittent numbness in L LE  Aggravating factors:  walking starts after ~400 ft, lifting; the more I do; first thing in am Relieving factors: resting, heat  PRECAUTIONS: None  RED FLAGS: None   WEIGHT BEARING RESTRICTIONS: No  FALLS:  Has patient fallen in last 6 months? No  LIVING ENVIRONMENT: Lives with: lives alone Lives in: House/apartment Stairs: Yes: External: 3 steps; wall Has following equipment at home: None  OCCUPATION: Insurance  PLOF: Independent  PATIENT GOALS: decrease pain  NEXT MD VISIT: as needed  OBJECTIVE:  Note: Objective measures were completed at Evaluation unless otherwise noted.  DIAGNOSTIC FINDINGS:  LB Imaging 5/25: degenerative scoliosis as well as advanced degenerative disc disease with severe to space narrowing of all levels of the lumbar spine.   bilateral midfoot osteoarthritis; left hallux varus and right hallux valgus with bilateral 2nd hammertoes   PATIENT SURVEYS:  Modified Oswestry:  MODIFIED OSWESTRY DISABILITY SCALE   Date: 01/23/24 Score 03/11/24  Pain intensity 1 = The pain is bad, but I can manage without having to take (1) I can stand as long as I want but, it increases my pain. pain medication. 0  2. Personal care (washing, dressing, etc.) 1 =  I can take care of myself normally, but it increases my pain. 1  3. Lifting 1 = I can lift heavy weights, but it causes increased pain. 3  4. Walking 2 =  Pain prevents me from walking more than  mile. 2  5. Sitting 1 =  I can only sit in my favorite chair as long as I like. 1  6. Standing 3 =  Pain prevents me from standing more than 1/2 hour. 4  7. Sleeping 0 = Pain does not prevent me from sleeping well. 0  8. Social Life 2 = Pain prevents me from participating in more energetic activities (eg. sports, dancing). 1  9. Traveling 0 =  I can travel anywhere without increased pain. 1  10. Employment/ Homemaking 2 = I can perform most of my homemaking/job duties, but pain prevents me from performing more physically stressful activities (eg,  lifting, vacuuming). 2  Total 11/50=22% 15/50=30%   Interpretation of scores: Score Category Description  0-20% Minimal Disability The patient can cope with most living activities. Usually no treatment is indicated apart from advice on lifting, sitting and exercise  21-40% Moderate Disability The patient experiences more pain and difficulty with sitting, lifting and standing. Travel and social life are more difficult and they may be disabled from work. Personal care, sexual activity and sleeping are not grossly affected, and the patient can usually be managed by conservative means  41-60% Severe Disability Pain remains the main problem in this group, but activities of daily living are affected. These patients require a detailed investigation  61-80% Crippled Back pain impinges on all aspects of the patient's life. Positive intervention is required  81-100% Bed-bound  These patients are either bed-bound or exaggerating their symptoms  Bluford FORBES Zoe DELENA Karon DELENA, et al. Surgery versus conservative management of stable thoracolumbar fracture: the PRESTO feasibility RCT. [place] ([place]): [employer]; 2021 Nov. [facility] Technology Assessment, No. 25.62.) Appendix 3, Oswestry Disability Index category descriptors. Available from: FindJewel[employer].cz  Minimally Clinically Important Difference (MCID) = 12.8%  COGNITION: Overall cognitive status: Within functional limits for tasks assessed     SENSATION: WFL   POSTURE: rounded shoulders, forward head, right pelvic obliquity, and weight shift right  PALPATION: No TTP  LUMBAR ROM:   AROM eval 02/18/24 03/11/24  Flexion FT mid shin  full  Extension Just to neutral No change No change  Right lateral flexion 25% limited 25% limited full  Left lateral flexion 75% limited 60% limited 25% limited  Right rotation     Left rotation      (Blank rows = not tested)  LOWER EXTREMITY ROM:     wfl  LOWER EXTREMITY  MMT:    MMT Right eval Left eval  Hip flexion 31.2 33.3  Hip extension    Hip abduction 17.6 19.6  Hip adduction    Hip internal rotation    Hip external rotation    Knee flexion    Knee extension 47.2 28.3  Ankle dorsiflexion    Ankle plantarflexion    Ankle inversion    Ankle eversion     (Blank rows = not tested)  LUMBAR SPECIAL TESTS:  Straight leg raise test: Negative, Slump test: Negative,  and Trendelenburg sign: Negative  FUNCTIONAL TESTS:  5 times sit to stand: 15.36 TUG: 13.52 4 stage balance test: passed 1&2.  Tandem needs asssitance to gain position holds for 3s; SLS x 5s     03/11/24: TUG 14.11 GAIT: Distance walked: 400 ft Assistive device utilized: None Level of assistance: Complete Independence Comments: trunk rotation right  TREATMENT: 03/20/24: Nustep Lvl 3-4 x 5 mins bilat UE/LEs  Supine marching with TrA Supine alt UE/LE  Supine manual HS stretch with passive AP's for nn flossing 2 sets of approx 40 sec bilat  LAQ with AP's 2x10 LAQ with 3# 2 x 10 R LE, approx 5 reps on L LE, 2# 2x10 L LE Standing rows with GTB 2 sets of 10-12 each Standing shoulder extension with TrA with RTB 2x15 Seated PNF diagonals with 1# with TrA Supine shoulder flex/ext with ball with TrA 2x10-12   Date: 03/18/24 Pt seen for aquatic therapy today.  Treatment took place in water  3.5-4.75 ft in depth at the [facility] pool. Temp of water  was 91.  Pt entered/exited the pool via stairs independently in step-to pattern (using RLE) with bil rail.   - unsupported walking forward/ backward - cues for even step length, - side stepping with arm add/abdct with yellow hand floats demonstration needed for execution -L stretch; hip hiking - suitcase carry with bil and single yellow hand float at side, marching forward backward  yellow noodle pull down for TrA engagement wide stance then staggered x 10 -standing horizontal add/abd wide stance -[place] -Plank on bench:  hip ext; fire hydrant x 5-7 -standing ue support yellow hand buoys 4.19ft: df; pf;  hip add/abd crossing midline alternating x 10; toe and heel raises -straddling noodle: cycling; ue support corner wall:hip add/abd and flex/ext -stair negotiation leading R then left, entire 6 steps.  Pt unable to alternate due to LLE weakness.    Pt requires the buoyancy and hydrostatic pressure of water  for support, and to offload joints by unweighting joint load by at least 50 % in navel deep water  and by at least 75-80% in chest to neck deep water .  Viscosity of the water  is needed for resistance of strengthening. Water  current perturbations provides challenge to standing balance requiring increased core activation.     PATIENT EDUCATION:  Education details:  exercise form, rationale of interventions, HEP, and POC Person educated: Patient Education method: Explanation, demonstration, verbal and tactile cues, handout Education comprehension: verbalized understanding, returned demonstration, verbal and tactile cues required  HOME EXERCISE PROGRAM: Aquatic This aquatic home exercise program from [employer] utilizes pictures from land based exercises, but has been adapted prior to lamination and issuance.   Access Code: [email or web address] URL: [email or web address] Date: 03/18/2024 Prepared by: Frankie Ziemba  Exercises - Hand buoy carry: Forward and Backward; bilaterally->unilaterally  - 1 x daily - 1-3 x weekly - Side Stepping with Hand Floats  - 1 x daily - 1-3 x weekly - Standing 'L' Stretch at [employer]  - 1 x daily - 1-3 x weekly - 1 sets - 3 reps - Leg swings flex/ext  - 1 x daily - 1-3 x weekly - 1-2 sets - 10 reps - Leg Swing out to the side  - 1 x daily - 1-3 x weekly - 1-2 sets - 10 reps - Heel Toe Raises at Pool Wall  - 1 x daily - 1-3 x weekly - 1-2 sets - 10 reps - Noodle press  - 1 x daily - 1-3 x weekly - 1-2 sets - 10 reps - Sitting Balance on Pool Noodle  - 1 x daily - 1-3 x  weekly - Sit to Stand Without Arm Support  - 1 x daily - 1-3 x weekly - 1-2 sets - 10 reps - Water  Step Up on Bottom Step  - 1 x daily - 1-3 x weekly - 1-2 sets - 10 reps    Access Code: [facility] URL: [email or web address] Date: 02/28/2024 Prepared by: Mose Minerva  Exercises - Supine Transversus Abdominis Bracing - Hands on Stomach  - 2 x daily - 7 x weekly - 2 sets - 10 reps - 5 seconds hold - Seated Hip Abduction with Resistance  - 1 x daily - 4 x weekly - 2 sets - 10 reps - Seated Shoulder Row with Anchored Resistance  - 1 x daily - 4-5 x weekly - 2 sets - 10 reps  ASSESSMENT:  CLINICAL IMPRESSION: PT performed exercises focusing on core and quad strength, lumbopelvic and postural stability, and neural flossing.  Pt had pain in L posterior thigh with supine alt UE/LE.  Pt required much cuing for correct form and to slow down movement.  Pt required cuing in other exercises also to slow down and control movement.  Pt had pain with eccentric phase of LAQ on L.  PT had pt perform seated PNF diagonals though pt has limited L shoulder motion.  He responded well to Rx reporting no increased pain after Rx. PT will plan to switch one of his land appt's to aquatic next [facility].    OBJECTIVE IMPAIRMENTS: Abnormal gait, decreased activity tolerance, decreased mobility, difficulty walking, decreased ROM, decreased strength, postural dysfunction, obesity, and pain.   ACTIVITY LIMITATIONS: carrying, lifting, sitting, standing, squatting, stairs, transfers, and locomotion level  PARTICIPATION LIMITATIONS: cleaning, community activity, and yard work  PERSONAL FACTORS: Fitness  and Time since onset of injury/illness/exacerbation are also affecting patient's functional outcome.   REHAB POTENTIAL: Good  CLINICAL DECISION MAKING: Stable/uncomplicated  EVALUATION COMPLEXITY: Low   GOALS: Goals reviewed with patient? Yes  SHORT TERM GOALS: Target date: 02/22/24  Pt will tolerate full  aquatic sessions consistently without increase in pain and with improving function to demonstrate good toleration and effectiveness of intervention.  Baseline: Goal status: MET - 02/15/24  2.  Pt will improve lumbar ROM Baseline: see chart Goal status: in progress 02/18/24; Met 03/11/24   LONG TERM GOALS: Target date: 04/04/24  Pt to improve on ODI by 13-15% (MCID) to demonstrate statistically significant Improvement in function. Baseline: 11/50=22%; 15/50=30% Goal status: In progress 03/11/24  2.  Pt will report decrease in pain by at least 50% for improved toleration to activity/quality of life and to demonstrate improved management of pain. Baseline: see chart Goal status: INITIAL  3.  Pt will tolerate stair climbing using alternating pattern ascending and descending 6 steps with use of handrail Baseline: step to Goal status: In progress 03/18/24  4.  Pt will report toleration to community amb without limitation to pain Baseline:  Goal status: INITIAL  5.  Pt will improve on 5 X STS test to <or=11 to demonstrate improving functional lower extremity strength, transitional movements, and balance. (MDC = 4.2sec)  Baseline: 15.36; 14.11 Goal status: In progress 03/11/24  6.  Pt will be indep with final HEP's (land and aquatic as appropriate) for continued management of condition Baseline:  Goal status: In progress 03/18/24  PLAN:  PT FREQUENCY: 1-2x/week  PT DURATION: 10 weeks extended out 2 weeks due to scheduling conflicts; 16 visits  PLANNED INTERVENTIONS: 97164- PT Re-evaluation, 97750- Physical Performance Testing, 97110-Therapeutic exercises, 97530- Therapeutic activity, [id number]- Neuromuscular re-education, 97535- Self Care, [id number]- Manual therapy, [id number]- Gait training, [phone]- Aquatic Therapy, [phone]- Electrical stimulation (manual), [id number]- Traction (mechanical), [phone] (1-2 muscles), [id number] (3+ muscles)- Dry Needling, Patient/Family education, Balance training, Stair training, Taping,  Joint mobilization, DME instructions, Cryotherapy, and Moist heat.  PLAN FOR NEXT SESSION: Aquatic : instruct in HEP.  Land: core and LE strengthening and stretching; posture realignment, pain management; gait, balance and proprioception re-training.  Possible aquatic visit next visit.   Leigh Minerva III PT, DPT 03/21/24 5:24 PM  [facility] [address] [place], [place], [address] Phone: [phone]   Fax:  [phone]

## 2024-03-25 ENCOUNTER — Ambulatory Visit (HOSPITAL_BASED_OUTPATIENT_CLINIC_OR_DEPARTMENT_OTHER): Admitting: Physical Therapy

## 2024-03-25 DIAGNOSIS — R293 Abnormal posture: Secondary | ICD-10-CM | POA: Diagnosis not present

## 2024-03-25 DIAGNOSIS — M6281 Muscle weakness (generalized): Secondary | ICD-10-CM | POA: Diagnosis not present

## 2024-03-25 DIAGNOSIS — M5459 Other low back pain: Secondary | ICD-10-CM

## 2024-03-25 DIAGNOSIS — R262 Difficulty in walking, not elsewhere classified: Secondary | ICD-10-CM | POA: Diagnosis not present

## 2024-03-25 NOTE — Therapy (Signed)
 OUTPATIENT PHYSICAL THERAPY THORACOLUMBAR TREATMENT     Patient Name: Samuel [name] MRN: [id number] DOB:02/11/1951, 73 y.o., male Today's Date: 03/26/2024  END OF SESSION:  PT End of Session - 03/25/24 1708     Visit Number 14    Number of Visits 16    Date for PT Re-Evaluation 04/04/24    Authorization Type MCR    PT Start Time 1617    PT Stop Time 1703    PT Time Calculation (min) 46 min    Activity Tolerance Patient tolerated treatment well    Behavior During Therapy [facility] for tasks assessed/performed               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   severe- right anterior shoulder- (EXC)   Atypical nevus 07/18/2011   mild-mod-right upper abdomen   Back pain    Basal cell carcinoma 07/17/2019   nod-left forearm-([id number])   COVID 01/07/2021   EPISTAXIS, RECURRENT 12/21/2009   ESSENTIAL HYPERTENSION 12/21/2009   Hypertension    Joint pain    Other fatigue    Prediabetes    Retinal detachment    OD   Shortness of breath on exertion    VENTRICULAR HYPERTROPHY, LEFT 01/04/2010   Past Surgical History:  Procedure Laterality Date   CATARACT EXTRACTION Bilateral 2009   EYE SURGERY Bilateral 2009   cataract   GAS/FLUID EXCHANGE Right 09/26/2018   Procedure: Gas/Fluid Exchange;  Surgeon: Valdemar Rogue, MD;  Location: [facility] OR;  Service: Ophthalmology;  Laterality: Right;   PHOTOCOAGULATION WITH LASER Right 09/26/2018   Procedure: Photocoagulation With Laser;  Surgeon: Valdemar Rogue, MD;  Location: [facility] OR;  Service: Ophthalmology;  Laterality: Right;   SCLERAL BUCKLE Right 09/26/2018   Procedure: Scleral Buckle;  Surgeon: Valdemar Rogue, MD;  Location: [facility] OR;  Service: Ophthalmology;  Laterality: Right;   TOTAL HIP ARTHROPLASTY Right 02/16/2022   VITRECTOMY 25 GAUGE WITH SCLERAL BUCKLE Right 09/26/2018   Procedure: VITRECTOMY 25 GAUGE;  Surgeon: Valdemar Rogue, MD;   Location: [facility] OR;  Service: Ophthalmology;  Laterality: Right;   Patient Active Problem List   Diagnosis Date Noted   SOB (shortness of breath) on exertion 12/20/2021   Chronic osteoarthritis, Hip 11/21/2021   Pre-diabetes 11/21/2021   Urinary frequency 11/21/2021   Dehydration, mild 08/11/2021   Class 2 severe obesity with serious comorbidity and body mass index (BMI) of 39.0 to 39.9 in adult (HCC) 07/21/2021   NAFLD (nonalcoholic fatty liver disease) 98/87/7976   Hyperlipidemia 03/01/2021   Prediabetes 07/17/2017   VENTRICULAR HYPERTROPHY, LEFT 01/04/2010   Essential hypertension 12/21/2009   EPISTAXIS, RECURRENT 12/21/2009   ABNORMAL ELECTROCARDIOGRAM 12/21/2009    PCP: Wolm Baize MD  REFERRING PROVIDER: Donaciano Sprang MD  REFERRING DIAG: M54.50 (ICD-10-CM) - Low back pain, unspecified   Rationale for Evaluation and Treatment: Rehabilitation  THERAPY DIAG:  Other low back pain  Abnormal posture  Muscle weakness (generalized)  Difficulty in walking, not elsewhere classified  ONSET DATE:  a few years  SUBJECTIVE:  SUBJECTIVE STATEMENT: Pt denies any adverse effects after prior Rx.  Pt reports having increased pain when getting off the massage table.  He hurt for about an hour after the massage.  He states it feels like when he is getting up in the AM.  Pt denies pain in sitting and has 3/10 pain with walking.  Pt denies any adverse effects after prior treatment.     5/30 MD note--Imaging today of the low back shows degenerative scoliosis as well as advanced degenerative disc disease with severe to space narrowing of all levels of the lumbar spine.   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    PERTINENT HISTORY:  OA feet R THR 02/16/22    PAIN:  NPRS:   1/10 Pain location:  L glute and L HS  Pain description: dull, ache; tight; intermittent numbness in L LE  Aggravating factors: walking starts after ~400 ft, lifting; the more I do; first thing in am Relieving factors: resting, heat  PRECAUTIONS: None  RED FLAGS: None   WEIGHT BEARING RESTRICTIONS: No  FALLS:  Has patient fallen in last 6 months? No  LIVING ENVIRONMENT: Lives with: lives alone Lives in: House/apartment Stairs: Yes: External: 3 steps; wall Has following equipment at home: None  OCCUPATION: Insurance  PLOF: Independent  PATIENT GOALS: decrease pain  NEXT MD VISIT: as needed  OBJECTIVE:  Note: Objective measures were completed at Evaluation unless otherwise noted.  DIAGNOSTIC FINDINGS:  LB Imaging 5/25: degenerative scoliosis as well as advanced degenerative disc disease with severe to space narrowing of all levels of the lumbar spine.   bilateral midfoot osteoarthritis; left hallux varus and right hallux valgus with bilateral 2nd hammertoes   PATIENT SURVEYS:  Modified Oswestry:  MODIFIED OSWESTRY DISABILITY SCALE   Date: 01/23/24 Score 03/11/24  Pain intensity 1 = The pain is bad, but I can manage without having to take (1) I can stand as long as I want but, it increases my pain. pain medication. 0  2. Personal care (washing, dressing, etc.) 1 =  I can take care of myself normally, but it increases my pain. 1  3. Lifting 1 = I can lift heavy weights, but it causes increased pain. 3  4. Walking 2 =  Pain prevents me from walking more than  mile. 2  5. Sitting 1 =  I can only sit in my favorite chair as long as I like. 1  6. Standing 3 =  Pain prevents me from standing more than 1/2 hour. 4  7. Sleeping 0 = Pain does not prevent me from sleeping well. 0  8. Social Life 2 = Pain prevents me from participating in more energetic activities (eg. sports, dancing). 1  9. Traveling 0 =  I can travel anywhere without increased pain. 1  10. Employment/ Homemaking  2 = I can perform most of my homemaking/job duties, but pain prevents me from performing more physically stressful activities (eg, lifting, vacuuming). 2  Total 11/50=22% 15/50=30%   Interpretation of scores: Score Category Description  0-20% Minimal Disability The patient can cope with most living activities. Usually no treatment is indicated apart from advice on lifting, sitting and exercise  21-40% Moderate Disability The patient experiences more pain and difficulty with sitting, lifting and standing. Travel and social life are more difficult and they may be disabled from work. Personal care, sexual activity and sleeping are not grossly affected, and the patient can usually be managed by conservative  means  41-60% Severe Disability Pain remains the main problem in this group, but activities of daily living are affected. These patients require a detailed investigation  61-80% Crippled Back pain impinges on all aspects of the patient's life. Positive intervention is required  81-100% Bed-bound  These patients are either bed-bound or exaggerating their symptoms  Bluford FORBES Zoe DELENA Karon DELENA, et al. Surgery versus conservative management of stable thoracolumbar fracture: the PRESTO feasibility RCT. [place]): [employer]; 2021 Nov. [facility] Technology Assessment, No. 25.62.) Appendix 3, Oswestry Disability Index category descriptors. Available from: Find[employer]el[employer]cz  Minimally Clinically Important Difference (MCID) = 12.8%  COGNITION: Overall cognitive status: Within functional limits for tasks assessed     SENSATION: WFL   POSTURE: rounded shoulders, forward head, right pelvic obliquity, and weight shift right  PALPATION: No TTP  LUMBAR ROM:   AROM eval 02/18/24 03/11/24  Flexion FT mid shin  full  Extension Just to neutral No change No change  Right lateral flexion 25% limited 25% limited full  Left lateral flexion 75% limited 60% limited  25% limited  Right rotation     Left rotation      (Blank rows = not tested)  LOWER EXTREMITY ROM:     wfl  LOWER EXTREMITY MMT:    MMT Right eval Left eval  Hip flexion 31.2 33.3  Hip extension    Hip abduction 17.6 19.6  Hip adduction    Hip internal rotation    Hip external rotation    Knee flexion    Knee extension 47.2 28.3  Ankle dorsiflexion    Ankle plantarflexion    Ankle inversion    Ankle eversion     (Blank rows = not tested)  LUMBAR SPECIAL TESTS:  Straight leg raise test: Negative, Slump test: Negative,  and Trendelenburg sign: Negative  FUNCTIONAL TESTS:  5 times sit to stand: 15.36 TUG: 13.52 4 stage balance test: passed 1&2.  Tandem needs asssitance to gain position holds for 3s; SLS x 5s     03/11/24: TUG 14.11 GAIT: Distance walked: 400 ft Assistive device utilized: None Level of assistance: Complete Independence Comments: trunk rotation right  TREATMENT: 03/25/24: Pt seen for aquatic therapy today.  Treatment took place in water  3.5-4.75 ft in depth at the [facility] pool. Temp of water  was 91.  Pt entered/exited the pool via stairs independently with bil rail.   - unsupported walking forward/ backward  - side stepping with arm add/abdct with yellow hand floats demonstration needed for execution - suitcase carry with bil and single yellow hand float at side, marching forward backward  - yellow noodle pull down for TrA engagement wide stance then staggered x 10 each -[employer] -standing ue support yellow hand buoys 4.80ft: hip abd alternating 2 x 10; toe and heel raises 2x15 -straddling noodle: stationary cycling and dynamic cycling x 2 laps in the pool.  Pt requires the buoyancy and hydrostatic pressure of water  for support, and to offload joints by unweighting joint load by at least 50 % in navel deep water  and by at least 75-80% in chest to neck deep water .  Viscosity of the water  is needed for resistance of strengthening. Water   current perturbations provides challenge to standing balance requiring increased core activation.    03/20/24: Nustep Lvl 3-4 x 5 mins bilat UE/LEs  Supine marching with TrA Supine alt UE/LE  Supine manual HS stretch with passive AP's for nn flossing 2 sets of approx 40 sec bilat  LAQ with AP's 2x10 LAQ with 3# 2 x 10 R LE, approx 5 reps on L LE, 2# 2x10 L LE Standing rows with GTB 2 sets of 10-12 each Standing shoulder extension with TrA with RTB 2x15 Seated PNF diagonals with 1# with TrA Supine shoulder flex/ext with ball with TrA 2x10-12   Date: 03/18/24 Pt seen for aquatic therapy today.  Treatment took place in water  3.5-4.75 ft in depth at the [facility] pool. Temp of water  was 91.  Pt entered/exited the pool via stairs independently in step-to pattern (using RLE) with bil rail.   - unsupported walking forward/ backward - cues for even step length, - side stepping with arm add/abdct with yellow hand floats  - Kickboard push/pull with TrA 2x10 - yellow noodle pull down for TrA engagement wide stance then staggered x 10 - suitcase carry with bil and single yellow hand float at side, marching forward backward   -L stretch; hip hiking   -Bow [employer] Arrow  -standing ue support yellow hand buoys 4.20ft: df; pf;  hip add/abd crossing midline alternating x 10; toe and heel raises -straddling noodle: cycling; ue support corner wall:hip add/abd and flex/ext -stair negotiation leading R then left, entire 6 steps.  Pt unable to alternate due to LLE weakness.    Pt requires the buoyancy and hydrostatic pressure of water  for support, and to offload joints by unweighting joint load by at least 50 % in navel deep water  and by at least 75-80% in chest to neck deep water .  Viscosity of the water  is needed for resistance of strengthening. Water  current perturbations provides challenge to standing balance requiring increased core activation.     PATIENT EDUCATION:  Education details:   exercise form, rationale of interventions, HEP, and POC Person educated: Patient Education method: Explanation, demonstration, verbal and tactile cues, handout Education comprehension: verbalized understanding, returned demonstration, verbal and tactile cues required  HOME EXERCISE PROGRAM: Aquatic This aquatic home exercise program from [employer] utilizes pictures from land based exercises, but has been adapted prior to lamination and issuance.   Access Code: [email or web address] URL: [email or web address] Date: 03/18/2024 Prepared by: Frankie Ziemba  Exercises - Hand buoy carry: Forward and Backward; bilaterally->unilaterally  - 1 x daily - 1-3 x weekly - Side Stepping with Hand Floats  - 1 x daily - 1-3 x weekly - Standing 'L' Stretch at [employer]  - 1 x daily - 1-3 x weekly - 1 sets - 3 reps - Leg swings flex/ext  - 1 x daily - 1-3 x weekly - 1-2 sets - 10 reps - Leg Swing out to the side  - 1 x daily - 1-3 x weekly - 1-2 sets - 10 reps - Heel Toe Raises at Pool Wall  - 1 x daily - 1-3 x weekly - 1-2 sets - 10 reps - Noodle press  - 1 x daily - 1-3 x weekly - 1-2 sets - 10 reps - Sitting Balance on Pool Noodle  - 1 x daily - 1-3 x weekly - Sit to Stand Without Arm Support  - 1 x daily - 1-3 x weekly - 1-2 sets - 10 reps - Water  Step Up on Bottom Step  - 1 x daily - 1-3 x weekly - 1-2 sets - 10 reps    Access Code: [facility] URL: [email or web address] Date: 02/28/2024 Prepared by: Mose Minerva  Exercises - Supine Transversus Abdominis Bracing - Hands on Stomach  - 2 x daily - 7 x weekly - 2 sets -  10 reps - 5 seconds hold - Seated Hip Abduction with Resistance  - 1 x daily - 4 x weekly - 2 sets - 10 reps - Seated Shoulder Row with Anchored Resistance  - 1 x daily - 4-5 x weekly - 2 sets - 10 reps  ASSESSMENT:  CLINICAL IMPRESSION: Pt presents to PT today for an aquatic visit.  Pt had good tolerance with aquatic exercises and performed them well.  PT  provided cuing t/o treatment for TrA contraction.  Pt was able to cycle on noodle for 2 laps with good stability in the deep end.  He responded well to treatment reporting no increased pain after treatment and had no c/o's.  OBJECTIVE IMPAIRMENTS: Abnormal gait, decreased activity tolerance, decreased mobility, difficulty walking, decreased ROM, decreased strength, postural dysfunction, obesity, and pain.   ACTIVITY LIMITATIONS: carrying, lifting, sitting, standing, squatting, stairs, transfers, and locomotion level  PARTICIPATION LIMITATIONS: cleaning, community activity, and yard work  PERSONAL FACTORS: Fitness and Time since onset of injury/illness/exacerbation are also affecting patient's functional outcome.   REHAB POTENTIAL: Good  CLINICAL DECISION MAKING: Stable/uncomplicated  EVALUATION COMPLEXITY: Low   GOALS: Goals reviewed with patient? Yes  SHORT TERM GOALS: Target date: 02/22/24  Pt will tolerate full aquatic sessions consistently without increase in pain and with improving function to demonstrate good toleration and effectiveness of intervention.  Baseline: Goal status: MET - 02/15/24  2.  Pt will improve lumbar ROM Baseline: see chart Goal status: in progress 02/18/24; Met 03/11/24   LONG TERM GOALS: Target date: 04/04/24  Pt to improve on ODI by 13-15% (MCID) to demonstrate statistically significant Improvement in function. Baseline: 11/50=22%; 15/50=30% Goal status: In progress 03/11/24  2.  Pt will report decrease in pain by at least 50% for improved toleration to activity/quality of life and to demonstrate improved management of pain. Baseline: see chart Goal status: INITIAL  3.  Pt will tolerate stair climbing using alternating pattern ascending and descending 6 steps with use of handrail Baseline: step to Goal status: In progress 03/18/24  4.  Pt will report toleration to community amb without limitation to pain Baseline:  Goal status: INITIAL  5.  Pt will  improve on 5 X STS test to <or=11 to demonstrate improving functional lower extremity strength, transitional movements, and balance. (MDC = 4.2sec)  Baseline: 15.36; 14.11 Goal status: In progress 03/11/24  6.  Pt will be indep with final HEP's (land and aquatic as appropriate) for continued management of condition Baseline:  Goal status: In progress 03/18/24  PLAN:  PT FREQUENCY: 1-2x/week  PT DURATION: 10 weeks extended out 2 weeks due to scheduling conflicts; 16 visits  PLANNED INTERVENTIONS: [id number]- PT Re-evaluation, 97750- Physical Performance Testing, 97110-Therapeutic exercises, 97530- Therapeutic activity, [id number]- Neuromuscular re-education, 97535- Self Care, [id number]- Manual therapy, [id number]- Gait training, [id number]- Aquatic Therapy, [phone]- Electrical stimulation (manual), [id number]- Traction (mechanical), [phone] (1-2 muscles), [id number] (3+ muscles)- Dry Needling, Patient/Family education, Balance training, Stair training, Taping, Joint mobilization, DME instructions, Cryotherapy, and Moist heat.  PLAN FOR NEXT SESSION: Aquatic : instruct in HEP.  Land: core and LE strengthening and stretching; posture realignment, pain management; gait, balance and proprioception re-training.  Land therapy next visit.   Leigh Minerva III PT, DPT 03/26/24 11:20 PM   [facility] [address] [place], [place], [address] Phone: [phone]   Fax:  [phone]

## 2024-03-26 ENCOUNTER — Encounter (HOSPITAL_BASED_OUTPATIENT_CLINIC_OR_DEPARTMENT_OTHER): Payer: Self-pay | Admitting: Physical Therapy

## 2024-03-27 ENCOUNTER — Ambulatory Visit (HOSPITAL_BASED_OUTPATIENT_CLINIC_OR_DEPARTMENT_OTHER): Admitting: Physical Therapy

## 2024-03-27 DIAGNOSIS — M6281 Muscle weakness (generalized): Secondary | ICD-10-CM

## 2024-03-27 DIAGNOSIS — R262 Difficulty in walking, not elsewhere classified: Secondary | ICD-10-CM | POA: Diagnosis not present

## 2024-03-27 DIAGNOSIS — M5459 Other low back pain: Secondary | ICD-10-CM

## 2024-03-27 DIAGNOSIS — R293 Abnormal posture: Secondary | ICD-10-CM | POA: Diagnosis not present

## 2024-03-27 NOTE — Therapy (Addendum)
 OUTPATIENT PHYSICAL THERAPY THORACOLUMBAR TREATMENT     Patient Name: Samuel [name] MRN: [id number] DOB:[birth year], 73 y.o., male Today's Date: 03/28/2024  END OF SESSION:  PT End of Session - 03/27/24 1707     Visit Number 15    Number of Visits 16    Date for Recertification  04/04/24    Authorization Type MCR    PT Start Time 1615    PT Stop Time 1658    PT Time Calculation (min) 43 min    Activity Tolerance Patient tolerated treatment well    Behavior During Therapy [facility] for tasks assessed/performed                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   severe- right anterior shoulder- (EXC)   Atypical nevus 07/18/2011   mild-mod-right upper abdomen   Back pain    Basal cell carcinoma 07/17/2019   nod-left forearm-([id number])   COVID 01/07/2021   EPISTAXIS, RECURRENT 12/21/2009   ESSENTIAL HYPERTENSION 12/21/2009   Hypertension    Joint pain    Other fatigue    Prediabetes    Retinal detachment    OD   Shortness of breath on exertion    VENTRICULAR HYPERTROPHY, LEFT 01/04/2010   Past Surgical History:  Procedure Laterality Date   CATARACT EXTRACTION Bilateral 2009   EYE SURGERY Bilateral 2009   cataract   GAS/FLUID EXCHANGE Right 09/26/2018   Procedure: Gas/Fluid Exchange;  Surgeon: Valdemar Rogue, MD;  Location: [facility] OR;  Service: Ophthalmology;  Laterality: Right;   PHOTOCOAGULATION WITH LASER Right 09/26/2018   Procedure: Photocoagulation With Laser;  Surgeon: Valdemar Rogue, MD;  Location: [facility] OR;  Service: Ophthalmology;  Laterality: Right;   SCLERAL BUCKLE Right 09/26/2018   Procedure: Scleral Buckle;  Surgeon: Valdemar Rogue, MD;  Location: [facility] OR;  Service: Ophthalmology;  Laterality: Right;   TOTAL HIP ARTHROPLASTY Right 02/16/2022   VITRECTOMY 25 GAUGE WITH SCLERAL BUCKLE Right 09/26/2018   Procedure: VITRECTOMY 25 GAUGE;  Surgeon: Valdemar Rogue, MD;   Location: [facility] OR;  Service: Ophthalmology;  Laterality: Right;   Patient Active Problem List   Diagnosis Date Noted   SOB (shortness of breath) on exertion 12/20/2021   Chronic osteoarthritis, Hip 11/21/2021   Pre-diabetes 11/21/2021   Urinary frequency 11/21/2021   Dehydration, mild 08/11/2021   Class 2 severe obesity with serious comorbidity and body mass index (BMI) of 39.0 to 39.9 in adult (HCC) 07/21/2021   NAFLD (nonalcoholic fatty liver disease) 98/87/7976   Hyperlipidemia 03/01/2021   Prediabetes 07/17/2017   VENTRICULAR HYPERTROPHY, LEFT 01/04/2010   Essential hypertension 12/21/2009   EPISTAXIS, RECURRENT 12/21/2009   ABNORMAL ELECTROCARDIOGRAM 12/21/2009    PCP: Wolm Baize MD  REFERRING PROVIDER: Donaciano Sprang MD  REFERRING DIAG: M54.50 (ICD-10-CM) - Low back pain, unspecified   Rationale for Evaluation and Treatment: Rehabilitation  THERAPY DIAG:  Other low back pain  Abnormal posture  Muscle weakness (generalized)  Difficulty in walking, not elsewhere classified  ONSET DATE:  a few years  SUBJECTIVE:  SUBJECTIVE STATEMENT: Pt states he felt great after prior aquatic treatment.  Pt reports feeling good the following day after treatment and also today.  Pt states he has had a 0/10 pain for part of the day.    5/30 MD note--Imaging today of the low back shows degenerative scoliosis as well as advanced degenerative disc disease with severe to space narrowing of all levels of the lumbar spine.   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    PERTINENT HISTORY:  OA feet R THR 02/16/22    PAIN:  NPRS:  1/10 currently, but had a 0/10 earlier today Pain location:  L glute and L HS  Pain description: dull, ache; tight; intermittent numbness in L LE   Aggravating factors: walking starts after ~400 ft, lifting; the more I do; first thing in am Relieving factors: resting, heat  PRECAUTIONS: None  RED FLAGS: None   WEIGHT BEARING RESTRICTIONS: No  FALLS:  Has patient fallen in last 6 months? No  LIVING ENVIRONMENT: Lives with: lives alone Lives in: House/apartment Stairs: Yes: External: 3 steps; wall Has following equipment at home: None  OCCUPATION: Insurance  PLOF: Independent  PATIENT GOALS: decrease pain  NEXT MD VISIT: as needed  OBJECTIVE:  Note: Objective measures were completed at Evaluation unless otherwise noted.  DIAGNOSTIC FINDINGS:  LB Imaging 5/25: degenerative scoliosis as well as advanced degenerative disc disease with severe to space narrowing of all levels of the lumbar spine.   bilateral midfoot osteoarthritis; left hallux varus and right hallux valgus with bilateral 2nd hammertoes   PATIENT SURVEYS:  Modified Oswestry:  MODIFIED OSWESTRY DISABILITY SCALE   Date: 01/23/24 Score 03/11/24  Pain intensity 1 = The pain is bad, but I can manage without having to take (1) I can stand as long as I want but, it increases my pain. pain medication. 0  2. Personal care (washing, dressing, etc.) 1 =  I can take care of myself normally, but it increases my pain. 1  3. Lifting 1 = I can lift heavy weights, but it causes increased pain. 3  4. Walking 2 =  Pain prevents me from walking more than  mile. 2  5. Sitting 1 =  I can only sit in my favorite chair as long as I like. 1  6. Standing 3 =  Pain prevents me from standing more than 1/2 hour. 4  7. Sleeping 0 = Pain does not prevent me from sleeping well. 0  8. Social Life 2 = Pain prevents me from participating in more energetic activities (eg. sports, dancing). 1  9. Traveling 0 =  I can travel anywhere without increased pain. 1  10. Employment/ Homemaking 2 = I can perform most of my homemaking/job duties, but pain prevents me from performing more physically  stressful activities (eg, lifting, vacuuming). 2  Total 11/50=22% 15/50=30%   Interpretation of scores: Score Category Description  0-20% Minimal Disability The patient can cope with most living activities. Usually no treatment is indicated apart from advice on lifting, sitting and exercise  21-40% Moderate Disability The patient experiences more pain and difficulty with sitting, lifting and standing. Travel and social life are more difficult and they may be disabled from work. Personal care, sexual activity and sleeping are not grossly affected, and the patient can usually be managed by conservative means  41-60% Severe Disability Pain remains the main problem in this group, but activities of daily living are affected. These patients require  a detailed investigation  61-80% Crippled Back pain impinges on all aspects of the patient's life. Positive intervention is required  81-100% Bed-bound  These patients are either bed-bound or exaggerating their symptoms  Bluford FORBES Zoe DELENA Karon DELENA, et al. Surgery versus conservative management of stable thoracolumbar fracture: the PRESTO feasibility RCT. [place]): [employer]; 2021 Nov. [facility] Technology Assessment, No. 25.62.) Appendix 3, Oswestry Disability Index category descriptors. Available from: Find[employer]el[employer]cz  Minimally Clinically Important Difference (MCID) = 12.8%  COGNITION: Overall cognitive status: Within functional limits for tasks assessed     SENSATION: WFL   POSTURE: rounded shoulders, forward head, right pelvic obliquity, and weight shift right  PALPATION: No TTP  LUMBAR ROM:   AROM eval 02/18/24 03/11/24  Flexion FT mid shin  full  Extension Just to neutral No change No change  Right lateral flexion 25% limited 25% limited full  Left lateral flexion 75% limited 60% limited 25% limited  Right rotation     Left rotation      (Blank rows = not tested)  LOWER EXTREMITY ROM:      wfl  LOWER EXTREMITY MMT:    MMT Right eval Left eval  Hip flexion 31.2 33.3  Hip extension    Hip abduction 17.6 19.6  Hip adduction    Hip internal rotation    Hip external rotation    Knee flexion    Knee extension 47.2 28.3  Ankle dorsiflexion    Ankle plantarflexion    Ankle inversion    Ankle eversion     (Blank rows = not tested)  LUMBAR SPECIAL TESTS:  Straight leg raise test: Negative, Slump test: Negative,  and Trendelenburg sign: Negative  FUNCTIONAL TESTS:  5 times sit to stand: 15.36 TUG: 13.52 4 stage balance test: passed 1&2.  Tandem needs asssitance to gain position holds for 3s; SLS x 5s     03/11/24: TUG 14.11 GAIT: Distance walked: 400 ft Assistive device utilized: None Level of assistance: Complete Independence Comments: trunk rotation right  TREATMENT: 03/27/24: Nustep Lvl 5 x 6 mins bilat UE/LEs  Standing rows with GTB x 15 reps, BTB 2x15 Standing shoulder extension with TrA with GTB 2x15 LAQ R LE: 3# x 10, 4# 3x10; L LE, 2# 3x10 L LE Supine marching with TrA x10 Supine alt UE/LE  Supine alt LE extension approx 10 reps Supine shoulder flex/ext with ball with TrA 2x10-12 Supine manual HS stretch with passive AP's for nn flossing 2 sets of approx 40 sec bilat  LAQ with AP's 2x10 Sidestepping with UE's on rail x 3 laps Standing hip hikes with UE support on rail 2x10 R LE, 1x10 L LE    03/25/24: Pt seen for aquatic therapy today.  Treatment took place in water  3.5-4.75 ft in depth at the [facility] pool. Temp of water  was 91.  Pt entered/exited the pool via stairs independently with bil rail.   - unsupported walking forward/ backward  - side stepping with arm add/abdct with yellow hand floats demonstration needed for execution - suitcase carry with bil and single yellow hand float at side, marching forward backward  - yellow noodle pull down for TrA engagement wide stance then staggered x 10 each -[employer] -standing ue  support yellow hand buoys 4.65ft: hip abd alternating 2 x 10; toe and heel raises 2x15 -straddling noodle: stationary cycling and dynamic cycling x 2 laps in the pool.  Pt requires the buoyancy and hydrostatic pressure of water  for support, and  to offload joints by unweighting joint load by at least 50 % in navel deep water  and by at least 75-80% in chest to neck deep water .  Viscosity of the water  is needed for resistance of strengthening. Water  current perturbations provides challenge to standing balance requiring increased core activation.    03/20/24: Nustep Lvl 3-4 x 5 mins bilat UE/LEs  Supine marching with TrA Supine alt UE/LE  Supine manual HS stretch with passive AP's for nn flossing 2 sets of approx 40 sec bilat  LAQ with AP's 2x10 LAQ with 3# 2 x 10 R LE, approx 5 reps on L LE, 2# 2x10 L LE Standing rows with GTB 2 sets of 10-12 each Standing shoulder extension with TrA with RTB 2x15 Seated PNF diagonals with 1# with TrA Supine shoulder flex/ext with ball with TrA 2x10-12   Date: 03/18/24 Pt seen for aquatic therapy today.  Treatment took place in water  3.5-4.75 ft in depth at the [facility] pool. Temp of water  was 91.  Pt entered/exited the pool via stairs independently in step-to pattern (using RLE) with bil rail.   - unsupported walking forward/ backward - cues for even step length, - side stepping with arm add/abdct with yellow hand floats  - Kickboard push/pull with TrA 2x10 - yellow noodle pull down for TrA engagement wide stance then staggered x 10 - suitcase carry with bil and single yellow hand float at side, marching forward backward   -L stretch; hip hiking   -[employer] [place]  -standing ue support yellow hand buoys 4.34ft: df; pf;  hip add/abd crossing midline alternating x 10; toe and heel raises -straddling noodle: cycling; ue support corner wall:hip add/abd and flex/ext -stair negotiation leading R then left, entire 6 steps.  Pt unable to alternate due  to LLE weakness.    Pt requires the buoyancy and hydrostatic pressure of water  for support, and to offload joints by unweighting joint load by at least 50 % in navel deep water  and by at least 75-80% in chest to neck deep water .  Viscosity of the water  is needed for resistance of strengthening. Water  current perturbations provides challenge to standing balance requiring increased core activation.     PATIENT EDUCATION:  Education details:  exercise form, rationale of interventions, HEP, and POC Person educated: Patient Education method: Explanation, demonstration, verbal and tactile cues, handout Education comprehension: verbalized understanding, returned demonstration, verbal and tactile cues required  HOME EXERCISE PROGRAM: Aquatic This aquatic home exercise program from [employer] utilizes pictures from land based exercises, but has been adapted prior to lamination and issuance.   Access Code: [email or web address] URL: [email or web address] Date: 03/18/2024 Prepared by: Frankie Ziemba  Exercises - Hand buoy carry: Forward and Backward; bilaterally->unilaterally  - 1 x daily - 1-3 x weekly - Side Stepping with Hand Floats  - 1 x daily - 1-3 x weekly - Standing 'L' Stretch at [employer]  - 1 x daily - 1-3 x weekly - 1 sets - 3 reps - Leg swings flex/ext  - 1 x daily - 1-3 x weekly - 1-2 sets - 10 reps - Leg Swing out to the side  - 1 x daily - 1-3 x weekly - 1-2 sets - 10 reps - Heel Toe Raises at Pool Wall  - 1 x daily - 1-3 x weekly - 1-2 sets - 10 reps - Noodle press  - 1 x daily - 1-3 x weekly - 1-2 sets - 10 reps - Sitting Balance on Pool Noodle  -  1 x daily - 1-3 x weekly - Sit to Stand Without Arm Support  - 1 x daily - 1-3 x weekly - 1-2 sets - 10 reps - Water  Step Up on Bottom Step  - 1 x daily - 1-3 x weekly - 1-2 sets - 10 reps    Access Code: [facility] URL: [email or web address] Date: 02/28/2024 Prepared by: Mose Minerva  Exercises - Supine  Transversus Abdominis Bracing - Hands on Stomach  - 2 x daily - 7 x weekly - 2 sets - 10 reps - 5 seconds hold - Seated Hip Abduction with Resistance  - 1 x daily - 4 x weekly - 2 sets - 10 reps - Seated Shoulder Row with Anchored Resistance  - 1 x daily - 4-5 x weekly - 2 sets - 10 reps  ASSESSMENT:  CLINICAL IMPRESSION:  Pt responded well to prior aquatic treatment stating he felt great after treatment and continued to feel good the following 2 days.  Pt presents to PT today for an a land visit today.  PT performed exercises to improve lumbopelvic and postural stability, LE strength, neural mobility, and functional mobility.  During supine alt LE extension, He reported pain in his L post thigh when extending R LE and also pain in L knee when returning to starting position after extending L knee.  PT provided instruction and cuing for correct form, positioning, and posture with exercises.  PT progressed closed chain exercises with adding sidestepping and hip hiking.  Pt had some pain with hip hiking with L LE and PT had pt only perform 1 set.  He responded well to treatment.  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  PT to review aquatic handout and assess independence with aquatic program.    OBJECTIVE IMPAIRMENTS: Abnormal gait, decreased activity tolerance, decreased mobility, difficulty walking, decreased ROM, decreased strength, postural dysfunction, obesity, and pain.   ACTIVITY LIMITATIONS: carrying, lifting, sitting, standing, squatting, stairs, transfers, and locomotion level  PARTICIPATION LIMITATIONS: cleaning, community activity, and yard work  PERSONAL FACTORS: Fitness and Time since onset of injury/illness/exacerbation are also affecting patient's functional outcome.   REHAB POTENTIAL: Good  CLINICAL DECISION MAKING: Stable/uncomplicated  EVALUATION COMPLEXITY: Low   GOALS: Goals reviewed with patient? Yes  SHORT TERM GOALS: Target date: 02/22/24  Pt will tolerate full  aquatic sessions consistently without increase in pain and with improving function to demonstrate good toleration and effectiveness of intervention.  Baseline: Goal status: MET - 02/15/24  2.  Pt will improve lumbar ROM Baseline: see chart Goal status: in progress 02/18/24; Met 03/11/24   LONG TERM GOALS: Target date: 04/04/24  Pt to improve on ODI by 13-15% (MCID) to demonstrate statistically significant Improvement in function. Baseline: 11/50=22%; 15/50=30% Goal status: In progress 03/11/24  2.  Pt will report decrease in pain by at least 50% for improved toleration to activity/quality of life and to demonstrate improved management of pain. Baseline: see chart Goal status: INITIAL  3.  Pt will tolerate stair climbing using alternating pattern ascending and descending 6 steps with use of handrail Baseline: step to Goal status: In progress 03/18/24  4.  Pt will report toleration to community amb without limitation to pain Baseline:  Goal status: INITIAL  5.  Pt will improve on 5 X STS test to <or=11 to demonstrate improving functional lower extremity strength, transitional movements, and balance. (MDC = 4.2sec)  Baseline: 15.36; 14.11 Goal status: In progress 03/11/24  6.  Pt will be indep with final HEP's (  land and aquatic as appropriate) for continued management of condition Baseline:  Goal status: In progress 03/18/24  PLAN:  PT FREQUENCY: 1-2x/week  PT DURATION: 10 weeks extended out 2 weeks due to scheduling conflicts; 16 visits  PLANNED INTERVENTIONS: [id number]- PT Re-evaluation, 97750- Physical Performance Testing, 97110-Therapeutic exercises, 97530- Therapeutic activity, [id number]- Neuromuscular re-education, 97535- Self Care, [id number]- Manual therapy, [id number]- Gait training, [phone]- Aquatic Therapy, [phone]- Electrical stimulation (manual), [id number]- Traction (mechanical), [phone] (1-2 muscles), [id number] (3+ muscles)- Dry Needling, Patient/Family education, Balance training, Stair training, Taping,  Joint mobilization, DME instructions, Cryotherapy, and Moist heat.  PLAN FOR NEXT SESSION: Aquatic : instruct in HEP.  Land: core and LE strengthening and stretching; posture realignment, pain management; gait, balance and proprioception re-training.   Leigh Minerva III PT, DPT 03/28/24 3:32 PM    [facility] [address] [place], [place], [address] Phone: [phone]   Fax:  [phone]

## 2024-03-28 ENCOUNTER — Encounter (HOSPITAL_BASED_OUTPATIENT_CLINIC_OR_DEPARTMENT_OTHER): Payer: Self-pay | Admitting: Physical Therapy

## 2024-03-30 ENCOUNTER — Other Ambulatory Visit: Payer: Self-pay | Admitting: Family Medicine

## 2024-04-01 ENCOUNTER — Encounter: Payer: Self-pay | Admitting: Family Medicine

## 2024-04-01 ENCOUNTER — Encounter (HOSPITAL_BASED_OUTPATIENT_CLINIC_OR_DEPARTMENT_OTHER): Payer: Self-pay | Admitting: Physical Therapy

## 2024-04-01 ENCOUNTER — Ambulatory Visit (HOSPITAL_BASED_OUTPATIENT_CLINIC_OR_DEPARTMENT_OTHER): Admitting: Physical Therapy

## 2024-04-01 DIAGNOSIS — M5459 Other low back pain: Secondary | ICD-10-CM | POA: Diagnosis not present

## 2024-04-01 DIAGNOSIS — R293 Abnormal posture: Secondary | ICD-10-CM | POA: Diagnosis not present

## 2024-04-01 DIAGNOSIS — R262 Difficulty in walking, not elsewhere classified: Secondary | ICD-10-CM

## 2024-04-01 DIAGNOSIS — M6281 Muscle weakness (generalized): Secondary | ICD-10-CM | POA: Diagnosis not present

## 2024-04-01 MED ORDER — HYDROCHLOROTHIAZIDE 25 MG PO TABS: ORAL_TABLET | ORAL | 0 refills | Status: DC

## 2024-04-01 NOTE — Therapy (Signed)
 OUTPATIENT PHYSICAL THERAPY THORACOLUMBAR TREATMENT     Patient Name: Samuel [name] MRN: [id number] DOB:August 25, 1950, 73 y.o., male Today's Date: 04/02/2024  END OF SESSION:  PT End of Session - 04/01/24 1705     Visit Number 16    Number of Visits 17    Date for Recertification  04/04/24    Authorization Type MCR    PT Start Time 1630    PT Stop Time 1717    PT Time Calculation (min) 47 min    Activity Tolerance Patient tolerated treatment well    Behavior During Therapy [facility] for tasks assessed/performed                 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   severe- right anterior shoulder- (EXC)   Atypical nevus 07/18/2011   mild-mod-right upper abdomen   Back pain    Basal cell carcinoma 07/17/2019   nod-left forearm-([id number])   COVID 01/07/2021   EPISTAXIS, RECURRENT 12/21/2009   ESSENTIAL HYPERTENSION 12/21/2009   Hypertension    Joint pain    Other fatigue    Prediabetes    Retinal detachment    OD   Shortness of breath on exertion    VENTRICULAR HYPERTROPHY, LEFT 01/04/2010   Past Surgical History:  Procedure Laterality Date   CATARACT EXTRACTION Bilateral 2009   EYE SURGERY Bilateral 2009   cataract   GAS/FLUID EXCHANGE Right 09/26/2018   Procedure: Gas/Fluid Exchange;  Surgeon: Valdemar Rogue, MD;  Location: [facility] OR;  Service: Ophthalmology;  Laterality: Right;   PHOTOCOAGULATION WITH LASER Right 09/26/2018   Procedure: Photocoagulation With Laser;  Surgeon: Valdemar Rogue, MD;  Location: [facility] OR;  Service: Ophthalmology;  Laterality: Right;   SCLERAL BUCKLE Right 09/26/2018   Procedure: Scleral Buckle;  Surgeon: Valdemar Rogue, MD;  Location: [facility] OR;  Service: Ophthalmology;  Laterality: Right;   TOTAL HIP ARTHROPLASTY Right 02/16/2022   VITRECTOMY 25 GAUGE WITH SCLERAL BUCKLE Right 09/26/2018   Procedure: VITRECTOMY 25 GAUGE;  Surgeon: Valdemar Rogue, MD;   Location: [facility] OR;  Service: Ophthalmology;  Laterality: Right;   Patient Active Problem List   Diagnosis Date Noted   SOB (shortness of breath) on exertion 12/20/2021   Chronic osteoarthritis, Hip 11/21/2021   Pre-diabetes 11/21/2021   Urinary frequency 11/21/2021   Dehydration, mild 08/11/2021   Class 2 severe obesity with serious comorbidity and body mass index (BMI) of 39.0 to 39.9 in adult 07/21/2021   NAFLD (nonalcoholic fatty liver disease) 98/87/7976   Hyperlipidemia 03/01/2021   Prediabetes 07/17/2017   VENTRICULAR HYPERTROPHY, LEFT 01/04/2010   Essential hypertension 12/21/2009   EPISTAXIS, RECURRENT 12/21/2009   ABNORMAL ELECTROCARDIOGRAM 12/21/2009    PCP: Wolm Baize MD  REFERRING PROVIDER: Donaciano Sprang MD  REFERRING DIAG: M54.50 (ICD-10-CM) - Low back pain, unspecified   Rationale for Evaluation and Treatment: Rehabilitation  THERAPY DIAG:  Other low back pain  Abnormal posture  Muscle weakness (generalized)  Difficulty in walking, not elsewhere classified  ONSET DATE:  a few years  SUBJECTIVE:  SUBJECTIVE STATEMENT: Pt states his work is getting ready to be significantly busy.  Pt reports 4-5/10 pain after driving here from work.  He states he felt fine sitting in waiting room.  Pt is pleased he doesn't have the intense sciatic pain anymore.  Pt laid on his sides at massage therapy this past time instead of lying on his back and he felt better. Pt denies any adverse effects after prior Rx and states he felt really good after the prior aquatic treatment.   5/30 MD note--Imaging today of the low back shows degenerative scoliosis as well as advanced degenerative disc disease with severe to space narrowing of all levels of the lumbar spine.   POOL ACCESS: at apartment  complex and is now a member of [employer].    PERTINENT HISTORY:  OA feet R THR 02/16/22    PAIN:  NPRS:  1/10 currently, but had a 0/10 earlier today Pain location:  L glute and L HS  Pain description: dull, ache; tight; intermittent numbness in L LE  Aggravating factors: walking starts after ~400 ft, lifting; the more I do; first thing in am Relieving factors: resting, heat  PRECAUTIONS: None  RED FLAGS: None   WEIGHT BEARING RESTRICTIONS: No  FALLS:  Has patient fallen in last 6 months? No  LIVING ENVIRONMENT: Lives with: lives alone Lives in: House/apartment Stairs: Yes: External: 3 steps; wall Has following equipment at home: None  OCCUPATION: Insurance  PLOF: Independent  PATIENT GOALS: decrease pain  NEXT MD VISIT: as needed  OBJECTIVE:  Note: Objective measures were completed at Evaluation unless otherwise noted.  DIAGNOSTIC FINDINGS:  LB Imaging 5/25: degenerative scoliosis as well as advanced degenerative disc disease with severe to space narrowing of all levels of the lumbar spine.   bilateral midfoot osteoarthritis; left hallux varus and right hallux valgus with bilateral 2nd hammertoes   PATIENT SURVEYS:  Modified Oswestry:  MODIFIED OSWESTRY DISABILITY SCALE   Date: 01/23/24 Score 03/11/24  Pain intensity 1 = The pain is bad, but I can manage without having to take (1) I can stand as long as I want but, it increases my pain. pain medication. 0  2. Personal care (washing, dressing, etc.) 1 =  I can take care of myself normally, but it increases my pain. 1  3. Lifting 1 = I can lift heavy weights, but it causes increased pain. 3  4. Walking 2 =  Pain prevents me from walking more than  mile. 2  5. Sitting 1 =  I can only sit in my favorite chair as long as I like. 1  6. Standing 3 =  Pain prevents me from standing more than 1/2 hour. 4  7. Sleeping 0 = Pain does not prevent me from sleeping well. 0  8. Social Life 2 = Pain prevents me from participating  in more energetic activities (eg. sports, dancing). 1  9. Traveling 0 =  I can travel anywhere without increased pain. 1  10. Employment/ Homemaking 2 = I can perform most of my homemaking/job duties, but pain prevents me from performing more physically stressful activities (eg, lifting, vacuuming). 2  Total 11/50=22% 15/50=30%   Interpretation of scores: Score Category Description  0-20% Minimal Disability The patient can cope with most living activities. Usually no treatment is indicated apart from advice on lifting, sitting and exercise  21-40% Moderate Disability The patient experiences more pain and difficulty with sitting, lifting and standing. Travel and social life are more difficult and they  may be disabled from work. Personal care, sexual activity and sleeping are not grossly affected, and the patient can usually be managed by conservative means  41-60% Severe Disability Pain remains the main problem in this group, but activities of daily living are affected. These patients require a detailed investigation  61-80% Crippled Back pain impinges on all aspects of the patient's life. Positive intervention is required  81-100% Bed-bound  These patients are either bed-bound or exaggerating their symptoms  Bluford FORBES Zoe DELENA Karon DELENA, et al. Surgery versus conservative management of stable thoracolumbar fracture: the PRESTO feasibility RCT. [place]): [employer]; 2021 Nov. [facility] Technology Assessment, No. 25.62.) Appendix 3, Oswestry Disability Index category descriptors. Available from: FindJewel[employer].cz  Minimally Clinically Important Difference (MCID) = 12.8%  COGNITION: Overall cognitive status: Within functional limits for tasks assessed     SENSATION: WFL   POSTURE: rounded shoulders, forward head, right pelvic obliquity, and weight shift right  PALPATION: No TTP  LUMBAR ROM:   AROM eval 02/18/24 03/11/24  Flexion FT mid shin   full  Extension Just to neutral No change No change  Right lateral flexion 25% limited 25% limited full  Left lateral flexion 75% limited 60% limited 25% limited  Right rotation     Left rotation      (Blank rows = not tested)  LOWER EXTREMITY ROM:     wfl  LOWER EXTREMITY MMT:    MMT Right eval Left eval  Hip flexion 31.2 33.3  Hip extension    Hip abduction 17.6 19.6  Hip adduction    Hip internal rotation    Hip external rotation    Knee flexion    Knee extension 47.2 28.3  Ankle dorsiflexion    Ankle plantarflexion    Ankle inversion    Ankle eversion     (Blank rows = not tested)  LUMBAR SPECIAL TESTS:  Straight leg raise test: Negative, Slump test: Negative,  and Trendelenburg sign: Negative  FUNCTIONAL TESTS:  5 times sit to stand: 15.36 TUG: 13.52 4 stage balance test: passed 1&2.  Tandem needs asssitance to gain position holds for 3s; SLS x 5s     03/11/24: TUG 14.11 GAIT: Distance walked: 400 ft Assistive device utilized: None Level of assistance: Complete Independence Comments: trunk rotation right  TREATMENT: 04/01/24: Nustep Lvl 5 x 6 mins bilat UE/LEs  Standing rows with BTB 2x15 Standing shoulder extension with TrA with GTB 2x15 Sidestepping with UE's on rail x 3 laps Standing hip hikes with UE support on rail 2x10 R LE, 1x10 L LE Marching on Airex with UE support LAQ R LE: 5# 3x10; L LE, 2# 2x10 L LE Supine shoulder flex/ext with 1.1# ball with TrA 2x10-12 Supine manual HS stretch with passive AP's for nn flossing 2 sets of approx 40 sec bilat      03/27/24: Nustep Lvl 5 x 6 mins bilat UE/LEs  Standing rows with GTB x 15 reps, BTB 2x15 Standing shoulder extension with TrA with GTB 2x15 LAQ R LE: 3# x 10, 4# 3x10; L LE, 2# 3x10 L LE Supine marching with TrA x10 Supine alt UE/LE  Supine alt LE extension approx 10 reps Supine shoulder flex/ext with ball with TrA 2x10-12 Supine manual HS stretch with passive AP's for nn flossing 2 sets of  approx 40 sec bilat  LAQ with AP's 2x10 Sidestepping with UE's on rail x 3 laps Standing hip hikes with UE support on rail 2x10 R LE, 1x10 L LE  03/25/24: Pt seen for aquatic therapy today.  Treatment took place in water  3.5-4.75 ft in depth at the [facility] pool. Temp of water  was 91.  Pt entered/exited the pool via stairs independently with bil rail.   - unsupported walking forward/ backward  - side stepping with arm add/abdct with yellow hand floats demonstration needed for execution - suitcase carry with bil and single yellow hand float at side, marching forward backward  - yellow noodle pull down for TrA engagement wide stance then staggered x 10 each -[employer] -standing ue support yellow hand buoys 4.77ft: hip abd alternating 2 x 10; toe and heel raises 2x15 -straddling noodle: stationary cycling and dynamic cycling x 2 laps in the pool.  Pt requires the buoyancy and hydrostatic pressure of water  for support, and to offload joints by unweighting joint load by at least 50 % in navel deep water  and by at least 75-80% in chest to neck deep water .  Viscosity of the water  is needed for resistance of strengthening. Water  current perturbations provides challenge to standing balance requiring increased core activation.    03/20/24: Nustep Lvl 3-4 x 5 mins bilat UE/LEs  Supine marching with TrA Supine alt UE/LE  Supine manual HS stretch with passive AP's for nn flossing 2 sets of approx 40 sec bilat  LAQ with AP's 2x10 LAQ with 3# 2 x 10 R LE, approx 5 reps on L LE, 2# 2x10 L LE Standing rows with GTB 2 sets of 10-12 each Standing shoulder extension with TrA with RTB 2x15 Seated PNF diagonals with 1# with TrA Supine shoulder flex/ext with ball with TrA 2x10-12   Date: 03/18/24 Pt seen for aquatic therapy today.  Treatment took place in water  3.5-4.75 ft in depth at the [facility] pool. Temp of water  was 91.  Pt entered/exited the pool via stairs independently  in step-to pattern (using RLE) with bil rail.   - unsupported walking forward/ backward - cues for even step length, - side stepping with arm add/abdct with yellow hand floats  - Kickboard push/pull with TrA 2x10 - yellow noodle pull down for TrA engagement wide stance then staggered x 10 - suitcase carry with bil and single yellow hand float at side, marching forward backward   -L stretch; hip hiking   -Bow [employer] Arrow  -standing ue support yellow hand buoys 4.57ft: df; pf;  hip add/abd crossing midline alternating x 10; toe and heel raises -straddling noodle: cycling; ue support corner wall:hip add/abd and flex/ext -stair negotiation leading R then left, entire 6 steps.  Pt unable to alternate due to LLE weakness.    Pt requires the buoyancy and hydrostatic pressure of water  for support, and to offload joints by unweighting joint load by at least 50 % in navel deep water  and by at least 75-80% in chest to neck deep water .  Viscosity of the water  is needed for resistance of strengthening. Water  current perturbations provides challenge to standing balance requiring increased core activation.     PATIENT EDUCATION:  Education details:  exercise form, rationale of interventions, HEP, and POC Person educated: Patient Education method: Explanation, demonstration, verbal and tactile cues, handout Education comprehension: verbalized understanding, returned demonstration, verbal and tactile cues required  HOME EXERCISE PROGRAM: Aquatic This aquatic home exercise program from [employer] utilizes pictures from land based exercises, but has been adapted prior to lamination and issuance.   Access Code: [email or web address] URL: [email or web address] Date: 03/18/2024 Prepared by: Frankie Ziemba  Exercises - Hand buoy carry:  Forward and Backward; bilaterally->unilaterally  - 1 x daily - 1-3 x weekly - Side Stepping with Hand Floats  - 1 x daily - 1-3 x weekly - Standing 'L' Stretch at [employer]  - 1 x daily - 1-3 x weekly - 1 sets - 3 reps - Leg swings flex/ext  - 1 x daily - 1-3 x weekly - 1-2 sets - 10 reps - Leg Swing out to the side  - 1 x daily - 1-3 x weekly - 1-2 sets - 10 reps - Heel Toe Raises at Pool Wall  - 1 x daily - 1-3 x weekly - 1-2 sets - 10 reps - Noodle press  - 1 x daily - 1-3 x weekly - 1-2 sets - 10 reps - Sitting Balance on Pool Noodle  - 1 x daily - 1-3 x weekly - Sit to Stand Without Arm Support  - 1 x daily - 1-3 x weekly - 1-2 sets - 10 reps - Water  Step Up on Bottom Step  - 1 x daily - 1-3 x weekly - 1-2 sets - 10 reps    Access Code: [facility] URL: [email or web address] Date: 02/28/2024 Prepared by: Mose Minerva  Exercises - Supine Transversus Abdominis Bracing - Hands on Stomach  - 2 x daily - 7 x weekly - 2 sets - 10 reps - 5 seconds hold - Seated Hip Abduction with Resistance  - 1 x daily - 4 x weekly - 2 sets - 10 reps - Seated Shoulder Row with Anchored Resistance  - 1 x daily - 4-5 x weekly - 2 sets - 10 reps  ASSESSMENT:  CLINICAL IMPRESSION:  Pt responded well to prior aquatic treatment stating he felt great after treatment and continued to feel good the following 2 days.  Pt presents to PT today for an a land visit today.  PT performed exercises to improve lumbopelvic and postural stability, LE strength, neural mobility, and functional mobility.  During supine alt LE extension, He reported pain in his L post thigh when extending R LE and also pain in L knee when returning to starting position after extending L knee.  PT provided instruction and cuing for correct form, positioning, and posture with exercises.  PT progressed closed chain exercises with adding sidestepping and hip hiking.  Pt had some pain with hip hiking with L LE and PT had pt only perform 1 set.  He responded well to treatment.  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  PT to review aquatic handout and assess independence with aquatic program.    Pt is  pleased he doesn't have the intense sciatic pain 1/10  OBJECTIVE IMPAIRMENTS: Abnormal gait, decreased activity tolerance, decreased mobility, difficulty walking, decreased ROM, decreased strength, postural dysfunction, obesity, and pain.   ACTIVITY LIMITATIONS: carrying, lifting, sitting, standing, squatting, stairs, transfers, and locomotion level  PARTICIPATION LIMITATIONS: cleaning, community activity, and yard work  PERSONAL FACTORS: Fitness and Time since onset of injury/illness/exacerbation are also affecting patient's functional outcome.   REHAB POTENTIAL: Good  CLINICAL DECISION MAKING: Stable/uncomplicated  EVALUATION COMPLEXITY: Low   GOALS: Goals reviewed with patient? Yes  SHORT TERM GOALS: Target date: 02/22/24  Pt will tolerate full aquatic sessions consistently without increase in pain and with improving function to demonstrate good toleration and effectiveness of intervention.  Baseline: Goal status: MET - 02/15/24  2.  Pt will improve lumbar ROM Baseline: see chart Goal status: in progress 02/18/24; Met 03/11/24   LONG TERM GOALS: Target date:  04/04/24  Pt to improve on ODI by 13-15% (MCID) to demonstrate statistically significant Improvement in function. Baseline: 11/50=22%; 15/50=30% Goal status: In progress 03/11/24  2.  Pt will report decrease in pain by at least 50% for improved toleration to activity/quality of life and to demonstrate improved management of pain. Baseline: see chart Goal status: INITIAL  3.  Pt will tolerate stair climbing using alternating pattern ascending and descending 6 steps with use of handrail Baseline: step to Goal status: In progress 03/18/24  4.  Pt will report toleration to community amb without limitation to pain Baseline:  Goal status: INITIAL  5.  Pt will improve on 5 X STS test to <or=11 to demonstrate improving functional lower extremity strength, transitional movements, and balance. (MDC = 4.2sec)  Baseline: 15.36;  14.11 Goal status: In progress 03/11/24  6.  Pt will be indep with final HEP's (land and aquatic as appropriate) for continued management of condition Baseline:  Goal status: In progress 03/18/24  PLAN:  PT FREQUENCY: 1-2x/week  PT DURATION: 10 weeks extended out 2 weeks due to scheduling conflicts; 16 visits  PLANNED INTERVENTIONS: [id number]- PT Re-evaluation, 97750- Physical Performance Testing, 97110-Therapeutic exercises, 97530- Therapeutic activity, [id number]- Neuromuscular re-education, 97535- Self Care, [id number]- Manual therapy, [id number]- Gait training, [phone]- Aquatic Therapy, [phone]- Electrical stimulation (manual), [id number]- Traction (mechanical), [phone] (1-2 muscles), [id number] (3+ muscles)- Dry Needling, Patient/Family education, Balance training, Stair training, Taping, Joint mobilization, DME instructions, Cryotherapy, and Moist heat.  PLAN FOR NEXT SESSION: Aquatic : instruct in HEP.  Land: core and LE strengthening and stretching; posture realignment, pain management; gait, balance and proprioception re-training.   Leigh Minerva III PT, DPT 04/02/24 5:23 PM    [facility] [address] [place], [place], [address] Phone: [phone]   Fax:  [phone]

## 2024-04-03 ENCOUNTER — Ambulatory Visit (HOSPITAL_BASED_OUTPATIENT_CLINIC_OR_DEPARTMENT_OTHER): Admitting: Physical Therapy

## 2024-04-03 DIAGNOSIS — R262 Difficulty in walking, not elsewhere classified: Secondary | ICD-10-CM | POA: Diagnosis not present

## 2024-04-03 DIAGNOSIS — M6281 Muscle weakness (generalized): Secondary | ICD-10-CM

## 2024-04-03 DIAGNOSIS — M5459 Other low back pain: Secondary | ICD-10-CM

## 2024-04-03 DIAGNOSIS — R293 Abnormal posture: Secondary | ICD-10-CM

## 2024-04-03 NOTE — Therapy (Signed)
 OUTPATIENT PHYSICAL THERAPY THORACOLUMBAR TREATMENT     Patient Name: Samuel [name] MRN: [id number] DOB:11-04-1950, 73 y.o., male Today's Date: 04/04/2024  END OF SESSION:  PT End of Session - 04/03/24 1627     Visit Number 17    Number of Visits 17    Date for Recertification  04/04/24    Authorization Type MCR    PT Start Time 1626    PT Stop Time 1716    PT Time Calculation (min) 50 min    Activity Tolerance Patient tolerated treatment well    Behavior During Therapy [facility] for tasks assessed/performed                 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   severe- right anterior shoulder- (EXC)   Atypical nevus 07/18/2011   mild-mod-right upper abdomen   Back pain    Basal cell carcinoma 07/17/2019   nod-left forearm-([id number])   COVID 01/07/2021   EPISTAXIS, RECURRENT 12/21/2009   ESSENTIAL HYPERTENSION 12/21/2009   Hypertension    Joint pain    Other fatigue    Prediabetes    Retinal detachment    OD   Shortness of breath on exertion    VENTRICULAR HYPERTROPHY, LEFT 01/04/2010   Past Surgical History:  Procedure Laterality Date   CATARACT EXTRACTION Bilateral 2009   EYE SURGERY Bilateral 2009   cataract   GAS/FLUID EXCHANGE Right 09/26/2018   Procedure: Gas/Fluid Exchange;  Surgeon: Valdemar Rogue, MD;  Location: [facility] OR;  Service: Ophthalmology;  Laterality: Right;   PHOTOCOAGULATION WITH LASER Right 09/26/2018   Procedure: Photocoagulation With Laser;  Surgeon: Valdemar Rogue, MD;  Location: [facility] OR;  Service: Ophthalmology;  Laterality: Right;   SCLERAL BUCKLE Right 09/26/2018   Procedure: Scleral Buckle;  Surgeon: Valdemar Rogue, MD;  Location: [facility] OR;  Service: Ophthalmology;  Laterality: Right;   TOTAL HIP ARTHROPLASTY Right 02/16/2022   VITRECTOMY 25 GAUGE WITH SCLERAL BUCKLE Right 09/26/2018   Procedure: VITRECTOMY 25 GAUGE;  Surgeon: Valdemar Rogue, MD;   Location: [facility] OR;  Service: Ophthalmology;  Laterality: Right;   Patient Active Problem List   Diagnosis Date Noted   SOB (shortness of breath) on exertion 12/20/2021   Chronic osteoarthritis, Hip 11/21/2021   Pre-diabetes 11/21/2021   Urinary frequency 11/21/2021   Dehydration, mild 08/11/2021   Class 2 severe obesity with serious comorbidity and body mass index (BMI) of 39.0 to 39.9 in adult 07/21/2021   NAFLD (nonalcoholic fatty liver disease) 98/87/7976   Hyperlipidemia 03/01/2021   Prediabetes 07/17/2017   VENTRICULAR HYPERTROPHY, LEFT 01/04/2010   Essential hypertension 12/21/2009   EPISTAXIS, RECURRENT 12/21/2009   ABNORMAL ELECTROCARDIOGRAM 12/21/2009    PCP: Wolm Baize MD  REFERRING PROVIDER: Donaciano Sprang MD  REFERRING DIAG: M54.50 (ICD-10-CM) - Low back pain, unspecified   Rationale for Evaluation and Treatment: Rehabilitation  THERAPY DIAG:  Other low back pain  Abnormal posture  Muscle weakness (generalized)  Difficulty in walking, not elsewhere classified  ONSET DATE:  a few years  SUBJECTIVE:  SUBJECTIVE STATEMENT: Pt felt ok after prior Rx, and could tell his legs were worked out.  Pt states he has discomfort and pain when getting OOB, but felt a little better today.  He has a 4-6/10 pain typically first thing in AM.  His worst pain is 1st thing in AM.   Pt has pain and tightness in lumbar mm with standing activities including bending to fold clothes.  He feels better when he sits down.  Pt reports a 30-50% decrease in pain overall.  Pt states he is limited with ambulation due to pain.  Some days are better than others with ambulation.  Pt is seeing his MD for L knee and plans to get an x ray.    Pt states he will be very busy with work in October.   POOL ACCESS: at  apartment complex and [employer].    PERTINENT HISTORY:  OA feet R THR 02/16/22    PAIN:  NPRS:  2/10 currently with walking into clinic Pain location:  bilat lumbar flanks Pain description: dull, ache; tight; intermittent numbness in L LE  Aggravating factors: walking starts after ~400 ft, lifting; the more I do; first thing in am Relieving factors: resting, heat  PRECAUTIONS: None  RED FLAGS: None   WEIGHT BEARING RESTRICTIONS: No  FALLS:  Has patient fallen in last 6 months? No  LIVING ENVIRONMENT: Lives with: lives alone Lives in: House/apartment Stairs: Yes: External: 3 steps; wall Has following equipment at home: None  OCCUPATION: Insurance  PLOF: Independent  PATIENT GOALS: decrease pain  NEXT MD VISIT: as needed  OBJECTIVE:  Note: Objective measures were completed at Evaluation unless otherwise noted.  DIAGNOSTIC FINDINGS:  LB Imaging 5/25: degenerative scoliosis as well as advanced degenerative disc disease with severe to space narrowing of all levels of the lumbar spine.   bilateral midfoot osteoarthritis; left hallux varus and right hallux valgus with bilateral 2nd hammertoes   PATIENT SURVEYS:  Modified Oswestry:  MODIFIED OSWESTRY DISABILITY SCALE    Date: 01/23/24 Score 03/11/24 04/03/24  Pain intensity 1 = The pain is bad, but I can manage without having to take (1) I can stand as long as I want but, it increases my pain. pain medication. 0 0  2. Personal care (washing, dressing, etc.) 1 =  I can take care of myself normally, but it increases my pain. 1 1  3. Lifting 1 = I can lift heavy weights, but it causes increased pain. 3 1  4. Walking 2 =  Pain prevents me from walking more than  mile. 2 3  5. Sitting 1 =  I can only sit in my favorite chair as long as I like. 1 1  6. Standing 3 =  Pain prevents me from standing more than 1/2 hour. 4 3  7. Sleeping 0 = Pain does not prevent me from sleeping well. 0 0  8. Social Life 2 = Pain prevents me from  participating in more energetic activities (eg. sports, dancing). 1 2  9. Traveling 0 =  I can travel anywhere without increased pain. 1 1  10. Employment/ Homemaking 2 = I can perform most of my homemaking/job duties, but pain prevents me from performing more physically stressful activities (eg, lifting, vacuuming). 2 1  Total 11/50=22% 15/50=30% 13/50 = 26%   Interpretation of scores: Score Category Description  0-20% Minimal Disability The patient can cope with most living activities. Usually no treatment is indicated apart from advice on lifting, sitting and exercise  21-40% Moderate Disability The patient experiences more pain and difficulty with sitting, lifting and standing. Travel and social life are more difficult and they may be disabled from work. Personal care, sexual activity and sleeping are not grossly affected, and the patient can usually be managed by conservative means  41-60% Severe Disability Pain remains the main problem in this group, but activities of daily living are affected. These patients require a detailed investigation  61-80% Crippled Back pain impinges on all aspects of the patient's life. Positive intervention is required  81-100% Bed-bound  These patients are either bed-bound or exaggerating their symptoms  Bluford FORBES Zoe DELENA Karon DELENA, et al. Surgery versus conservative management of stable thoracolumbar fracture: the PRESTO feasibility RCT. [place]): [employer]; 2021 Nov. [facility] Technology Assessment, No. 25.62.) Appendix 3, Oswestry Disability Index category descriptors. Available from: FindJewelers.cz  Minimally Clinically Important Difference (MCID) = 12.8%  COGNITION: Overall cognitive status: Within functional limits for tasks assessed     SENSATION: WFL   POSTURE: rounded shoulders, forward head, right pelvic obliquity, and weight shift right  PALPATION: No TTP  LUMBAR ROM:   AROM eval 02/18/24  03/11/24  Flexion FT mid shin  full  Extension Just to neutral No change No change  Right lateral flexion 25% limited 25% limited full  Left lateral flexion 75% limited 60% limited 25% limited  Right rotation     Left rotation      (Blank rows = not tested)  LOWER EXTREMITY ROM:     wfl  LOWER EXTREMITY MMT:    MMT Right eval Left eval  Hip flexion 31.2 33.3  Hip extension    Hip abduction 17.6 19.6  Hip adduction    Hip internal rotation    Hip external rotation    Knee flexion    Knee extension 47.2 28.3  Ankle dorsiflexion    Ankle plantarflexion    Ankle inversion    Ankle eversion     (Blank rows = not tested)  LUMBAR SPECIAL TESTS:  Straight leg raise test: Negative, Slump test: Negative,  and Trendelenburg sign: Negative  FUNCTIONAL TESTS:  5 times sit to stand: 15.36 TUG: 13.52 4 stage balance test: passed 1&2.  Tandem needs asssitance to gain position holds for 3s; SLS x 5s     03/11/24: TUG 14.11 GAIT: Distance walked: 400 ft Assistive device utilized: None Level of assistance: Complete Independence Comments: trunk rotation right  TREATMENT: 04/03/24 Reviewed current function, response to prior Rx, and pain level.  5x STS test:  9.06 without UE support.  No increased pain  Pt ascended 6 stairs with a reciprocal gait with the rail and descended stairs with a step to gait with the rail.   PT reviewed aquatic and land HEP.  PT updated land HEP and gave pt a HEP handout.  PT educated pt in correct for and appropriate frequency.   Standing rows with BTB 2x15 with TrA Standing shoulder extension with TrA 2x15 Sidestepping x 2 laps at rail without and with UE support      PATIENT EDUCATION:  Education details:  exercise form, objective findings, rationale of interventions, HEP, and POC/discharge planning. Person educated: Patient Education method: Explanation, demonstration, verbal and tactile cues, handout Education comprehension: verbalized  understanding, returned demonstration, verbal and tactile cues required  HOME EXERCISE PROGRAM: Aquatic This aquatic home exercise program from [employer] utilizes pictures from land based exercises, but has been adapted prior to lamination and issuance.   Access Code: [email or web address]  URL: [email or web address] Date: 03/18/2024 Prepared by: Frankie Ziemba  Exercises - Hand buoy carry: Forward and Backward; bilaterally->unilaterally  - 1 x daily - 1-3 x weekly - Side Stepping with Hand Floats  - 1 x daily - 1-3 x weekly - Standing 'L' Stretch at [employer]  - 1 x daily - 1-3 x weekly - 1 sets - 3 reps - Leg swings flex/ext  - 1 x daily - 1-3 x weekly - 1-2 sets - 10 reps - Leg Swing out to the side  - 1 x daily - 1-3 x weekly - 1-2 sets - 10 reps - Heel Toe Raises at Pool Wall  - 1 x daily - 1-3 x weekly - 1-2 sets - 10 reps - Noodle press  - 1 x daily - 1-3 x weekly - 1-2 sets - 10 reps - Sitting Balance on Pool Noodle  - 1 x daily - 1-3 x weekly - Sit to Stand Without Arm Support  - 1 x daily - 1-3 x weekly - 1-2 sets - 10 reps - Water  Step Up on Bottom Step  - 1 x daily - 1-3 x weekly - 1-2 sets - 10 reps    Access Code: [facility] URL: [email or web address] Date: 02/28/2024 Prepared by: Mose Minerva  Exercises - Supine Transversus Abdominis Bracing - Hands on Stomach  - 2 x daily - 7 x weekly - 2 sets - 10 reps - 5 seconds hold - Seated Hip Abduction with Resistance  - 1 x daily - 4 x weekly - 2 sets - 10 reps  Updated HEP: - Standing Shoulder Row with Anchored Resistance  - 1 x daily - 4-5 x weekly - 2 sets - 10-15 reps - Shoulder extension with resistance - Neutral  - 1 x daily - 4-5 x weekly - 2 sets - 10-15 reps - Side Stepping with Counter Support  - 1 x daily - 5-6 x weekly - 2-3 reps  ASSESSMENT:  CLINICAL IMPRESSION:    Pt reports a 30-50% decrease in pain overall.  He continues to be limited with ambulation and standing due to pain.  He reports  having tightness in lumbar with ambulation and standing which affects household chores also.  He doesn't have the intense sciatic pain anymore.  His worst pain is 1st thing in AM.  Pt demonstrated improved performance of 5x STS test from 15.36 sec initially to 9.06 currently.  Pt demonstrates minimal improvement in self perceived disability with Modified Oswestry score improving by 4%.  Pt ascended stairs with a reciprocal gait with the rail though had difficulty descending stairs.  PT reviewed aquatic HEP with pt and he is independent with aquatic HEP.  PT also reviewed land based HEP and updated HEP.  PT gave pt an advanced HEP handout.  Pt is independent with land based HEP also.  Pt has met all STG's and LTG's # 5,6 and partially met LTG's #2,3.  Pt is ready for discharge.     OBJECTIVE IMPAIRMENTS: Abnormal gait, decreased activity tolerance, decreased mobility, difficulty walking, decreased ROM, decreased strength, postural dysfunction, obesity, and pain.   ACTIVITY LIMITATIONS: carrying, lifting, sitting, standing, squatting, stairs, transfers, and locomotion level  PARTICIPATION LIMITATIONS: cleaning, community activity, and yard work  PERSONAL FACTORS: Fitness and Time since onset of injury/illness/exacerbation are also affecting patient's functional outcome.   REHAB POTENTIAL: Good  CLINICAL DECISION MAKING: Stable/uncomplicated  EVALUATION COMPLEXITY: Low   GOALS: Goals reviewed with patient? Yes  SHORT TERM GOALS: Target date: 02/22/24  Pt will tolerate full aquatic sessions consistently without increase in pain and with improving function to demonstrate good toleration and effectiveness of intervention.  Baseline: Goal status: MET - 02/15/24  2.  Pt will improve lumbar ROM Baseline: see chart Goal status: in progress 02/18/24; Met 03/11/24   LONG TERM GOALS: Target date: 04/04/24  Pt to improve on ODI by 13-15% (MCID) to demonstrate statistically significant Improvement in  function. Baseline Goal status: NOT MET  2.  Pt will report decrease in pain by at least 50% for improved toleration to activity/quality of life and to demonstrate improved management of pain. Baseline: 30-50%  9/25 Goal status: PARTIALLY MET  3.  Pt will tolerate stair climbing using alternating pattern ascending and descending 6 steps with use of handrail Baseline: step to Goal status:  50% MET  9/25  4.  Pt will report toleration to community amb without limitation to pain Baseline:  Goal status:  NOT MET  5.  Pt will improve on 5 X STS test to <or=11 to demonstrate improving functional lower extremity strength, transitional movements, and balance. (MDC = 4.2sec)  Baseline: 15.36; 14.11 ; 9.06 Goal status:  GOAL MET  9/25  6.  Pt will be indep with final HEP's (land and aquatic as appropriate) for continued management of condition Baseline:  Goal status:  GOAL MET  04/03/24  PLAN:  PT FREQUENCY: 1 visit  PT DURATION: 1 visit  PLANNED INTERVENTIONS: [id number]- PT Re-evaluation, 97750- Physical Performance Testing, 97110-Therapeutic exercises, 97530- Therapeutic activity, [id number]- Neuromuscular re-education, 97535- Self Care, [id number]- Manual therapy, [id number]- Gait training, [phone]- Aquatic Therapy, [phone]- Electrical stimulation (manual), [id number]- Traction (mechanical), [phone] (1-2 muscles), [id number] (3+ muscles)- Dry Needling, Patient/Family education, Balance training, Stair training, Taping, Joint mobilization, DME instructions, Cryotherapy, and Moist heat.  PLAN FOR NEXT SESSION: Pt is independent with aquatic and land based HEP.  He will be discharged from skilled PT and continue with aquatic and land based HEP.  Pt is appreciative of skilled PT and is agreeable with discharge.  Leigh Minerva III PT, DPT 04/04/24 4:59 PM   PHYSICAL THERAPY DISCHARGE SUMMARY  Visits from Start of Care: 17  Current functional level related to goals / functional outcomes: See above   Remaining  deficits: See above   Education / Equipment: HEP   Leigh Minerva III PT, DPT 04/04/24 5:10 PM  [facility] [address] [place], [place], [address] Phone: [phone]   Fax:  [phone]

## 2024-04-04 ENCOUNTER — Encounter (HOSPITAL_BASED_OUTPATIENT_CLINIC_OR_DEPARTMENT_OTHER): Payer: Self-pay | Admitting: Physical Therapy

## 2024-04-08 ENCOUNTER — Ambulatory Visit: Admitting: Family Medicine

## 2024-04-08 ENCOUNTER — Encounter: Payer: Self-pay | Admitting: Family Medicine

## 2024-04-08 VITALS — BP 141/74 | HR 55

## 2024-04-08 DIAGNOSIS — Z0000 Encounter for general adult medical examination without abnormal findings: Secondary | ICD-10-CM

## 2024-04-08 NOTE — Patient Instructions (Signed)
 I really enjoyed getting to talk with you today! I am available on Tuesdays and Thursdays for virtual visits if you have any questions or concerns, or if I can be of any further assistance.   CHECKLIST FROM ANNUAL WELLNESS VISIT:  -Follow up (please call to schedule if not scheduled after visit):   -yearly for annual wellness visit with primary care office  Here is a list of your preventive care/health maintenance measures and the plan for each if any are due:  PLAN For any measures below that may be due:    1. Please provide proof of receipt of Tdap so that we can update your record.   2. Can get the flu and covid vaccines at the pharmacy. Please lt us  know if you do so that we can update your record. Can get the flu shot at the office at your visit if you like.   Health Maintenance  Topic Date Due   COVID-19 Vaccine (3 - Moderna risk series) 04/24/2024 (Originally 11/11/2019)   Influenza Vaccine  10/07/2024 (Originally 02/08/2024)   DTaP/Tdap/Td (2 - Tdap) 04/08/2025 (Originally 02/19/2020)   Colonoscopy  03/23/2025   Medicare Annual Wellness (AWV)  04/08/2025   Pneumococcal Vaccine: 50+ Years  Completed   Hepatitis C Screening  Completed   Zoster Vaccines- Shingrix   Completed   HPV VACCINES  Aged Out   Meningococcal B Vaccine  Aged Out    -See a dentist at least yearly  -Get your eyes checked and then per your eye specialist's recommendations  -Alcohol: please limit to no more than 2 drinks in any given day. Alcohol can elevate blood pressure in some people.    -I have included below further information regarding a healthy whole foods based diet, physical activity guidelines for adults, stress management and opportunities for social connections. I hope you find this information useful.    -----------------------------------------------------------------------------------------------------------------------------------------------------------------------------------------------------------------------------------------------------------    NUTRITION: -eat real food: lots of colorful vegetables (half the plate) and fruits -5-7 servings of vegetables and fruits per day (fresh or steamed is best), exp. 2 servings of vegetables with lunch and dinner and 2 servings of fruit per day. Berries and greens such as kale and collards are great choices.  -consume on a regular basis:  fresh fruits, fresh veggies, fish, nuts, seeds, healthy oils (such as olive oil, avocado oil), whole grains (make sure for bread/pasta/crackers/etc., that the first ingredient on label contains the word whole), legumes. -can eat small amounts of dairy and lean meat (no larger than the palm of your hand), but avoid processed meats such as ham, bacon, lunch meat, etc. -drink water  -try to avoid fast food and pre-packaged foods, processed meat, ultra processed foods/beverages (donuts, candy, etc.) -most experts advise limiting sodium to < 2300mg  per day, should limit further is any chronic conditions such as high blood pressure, heart disease, diabetes, etc. The American Heart Association advised that < 1500mg  is is ideal -try to avoid foods/beverages that contain any ingredients with names you do not recognize  -try to avoid foods/beverages  with added sugar or sweeteners/sweets  -try to avoid sweet drinks (including diet drinks): soda, juice, Gatorade, sweet tea, power drinks, diet drinks -try to avoid white rice, white bread, pasta (unless whole grain)  EXERCISE GUIDELINES FOR ADULTS: -if you wish to increase your physical activity, do so gradually and with the approval of your doctor -STOP and seek medical care immediately if you have any chest pain, chest discomfort or trouble breathing when starting or  increasing  exercise  -move and stretch your body, legs, feet and arms when sitting for long periods -Physical activity guidelines for optimal health in adults: -get at least 150 minutes per week of moderate exercise (can talk, but not sing); this is about 20-30 minutes of sustained activity 5-7 days per week or two 10-15 minute episodes of sustained activity 5-7 days per week -do some muscle building/resistance training/strength training at least 2 days per week  -balance exercises 3+ days per week:   Stand somewhere where you have something sturdy to hold onto if you lose balance    1) lift up on toes, then back down, start with 5x per day and work up to 20x   2) stand and lift one leg straight out to the side so that foot is a few inches of the floor, start with 5x each side and work up to 20x each side   3) stand on one foot, start with 5 seconds each side and work up to 20 seconds on each side  If you need ideas or help with getting more active:  -Silver sneakers https://tools.silversneakers.com  -Walk with a Doc: http://www.duncan-williams.com/  -try to include resistance (weight lifting/strength building) and balance exercises twice per week: or the following link for ideas: http://castillo-powell.com/  BuyDucts.dk  STRESS MANAGEMENT: -can try meditating, or just sitting quietly with deep breathing while intentionally relaxing all parts of your body for 5 minutes daily -if you need further help with stress, anxiety or depression please follow up with your primary doctor or contact the wonderful folks at [facility]: [phone]  SOCIAL CONNECTIONS: -options in [place] if you wish to engage in more social and exercise related activities:  -Silver sneakers https://tools.silversneakers.com  -Walk with a Doc: http://www.duncan-williams.com/  -Check out the [facility] Active Adults 50+  section on the [place] [employer] (hiking clubs, book clubs, cards and games, chess, exercise classes, aquatic classes and much more) - see the website for details: https://www.[place].gov/departments/parks-recreation/active-adults50  -YouTube has lots of exercise videos for different ages and abilities as well  -[facility] (a variety of indoor and outdoor inperson activities for adults). 6315466998. [address]  -Virtual Online Classes (a variety of topics): see seniorplanet.org or call 8581137677  -consider volunteering at a school, hospice center, church, senior center or elsewhere

## 2024-04-08 NOTE — Progress Notes (Signed)
 PATIENT CHECK-IN and HEALTH RISK ASSESSMENT QUESTIONNAIRE:  -completed by phone/video for upcoming Medicare Preventive Visit  Pre-Visit Check-in: 1)Vitals (height, wt, BP, etc) - record in vitals section for visit on day of visit Request home vitals (wt, BP, etc.) and enter into vitals, THEN update Vital Signs SmartPhrase below at the top of the HPI. See below.  2)Review and Update Medications, Allergies PMH, Surgeries, Social history in Epic 3)Hospitalizations in the last year with date/reason? NO   4)Review and Update Care Team (patient's specialists) in Epic 5) Complete PHQ9 in Epic  6) Complete Fall Screening in Epic 7)Review all Health Maintenance Due and order if not done.  Medicare Wellness Patient Questionnaire:  Answer theses question about your habits: How often do you have a drink containing alcohol?Yes, every day  How many drinks containing alcohol do you have on a typical day when you are drinking?2-3 drinks  How often do you have six or more drinks on one occasion?No  Have you ever smoked yes  Quit date if applicable? Na   How many packs a day do/did you smoke? Unknown  Do you use smokeless tobacco?No  Do you use an illicit drugs?No  On average, how many days per week do you engage in moderate to strenuous exercise (like a brisk walk)?Has been doing PT recently, also so lives alone and does his on cooking, cleaning and shopping.  On average, how many minutes do you engage in exercise at this level?Na  Are you sexually active? Yes Number of partners?One  Typical breakfast: eggs fruit  Typical lunch: sandwich  Typical dinner:Meat Vegetables  Typical snacks: Crackers and cheese   Beverages: water , coffee, milk   Answer theses question about your everyday activities: Can you perform most household chores?No  Are you deaf or have significant trouble hearing?No  Do you feel that you have a problem with memory?No  Do you feel safe at home?Yes  8. Do you have any  difficulty performing your everyday activities?No  Are you having any difficulty walking, taking medications on your own, and or difficulty managing daily home needs?No  Do you have difficulty walking or climbing stairs?Yes, patient has left knee pain and back pain  Do you have difficulty dressing or bathing?No  Do you have difficulty doing errands alone such as visiting a doctor's office or shopping?No  Do you currently have any difficulty preparing food and eating?No  Do you currently have any difficulty using the toilet?No  Do you have any difficulty managing your finances?No  Do you have any difficulties with housekeeping of managing your housekeeping?No    Do you have Advanced Directives in place (Living Will, Healthcare Power or Attorney)? yes   Last eye Exam and location? A year ago, [employer] you currently use prescribed or non-prescribed narcotic or opioid pain medications?No   Do you have a history or close family history of breast, ovarian, tubal or peritoneal cancer or a family member with BRCA (breast cancer susceptibility 1 and 2) gene mutations? Yes, sister and aunts had Breast cancer   Nurse/Assistant Credentials/time stamp: Leah A.Wright CMA 11 pm    ----------------------------------------------------------------------------------------------------------------------------------------------------------------------------------------------------------------------  Because this visit was a virtual/telehealth visit, some criteria may be missing or patient reported. Any vitals not documented were not able to be obtained and vitals that have been documented are patient reported.    MEDICARE ANNUAL PREVENTIVE CARE VISIT WITH PROVIDER (Welcome to Medicare, initial annual wellness or annual wellness exam)  Virtual Visit via Video Note  I connected with Samuel [name] on 04/08/24  by a video enabled telemedicine application and verified that I am speaking with the  correct person using two identifiers.  Location patient: home Location provider:work or home office Persons participating in the virtual visit: patient, provider  Concerns and/or follow up today: reports is doing well. Just finished PT for sciatica and is doing better. Reports has appt with Dr. Micheal next week to look at his knee - sometimes bothers him when stepping down when doing PT.    See HM section in Epic for other details of completed HM.    ROS: negative for report of fevers, unintentional weight loss, vision changes, vision loss, hearing loss or change, chest pain, sob, hemoptysis, melena, hematochezia, hematuria, falls, bleeding or bruising, thoughts of suicide or self harm, memory loss  Patient-completed extensive health risk assessment - reviewed and discussed with the patient: See Health Risk Assessment completed with patient prior to the visit either above or in recent phone note. This was reviewed in detailed with the patient today and appropriate recommendations, orders and referrals were placed as needed per Summary below and patient instructions.   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   severe- right anterior shoulder- (EXC)   Atypical nevus 07/18/2011   mild-mod-right upper abdomen   Back pain    Basal cell carcinoma 07/17/2019   nod-left forearm-([id number])   COVID 01/07/2021   EPISTAXIS, RECURRENT 12/21/2009   ESSENTIAL HYPERTENSION 12/21/2009   Hypertension    Joint pain    Other fatigue    Prediabetes    Retinal detachment    OD   Shortness of breath on exertion    VENTRICULAR HYPERTROPHY,  LEFT 01/04/2010    Past Surgical History:  Procedure Laterality Date   CATARACT EXTRACTION Bilateral 2009   EYE SURGERY Bilateral 2009   cataract   GAS/FLUID EXCHANGE Right 09/26/2018   Procedure: Gas/Fluid Exchange;  Surgeon: Valdemar Rogue, MD;  Location: [facility] OR;  Service: Ophthalmology;  Laterality: Right;   PHOTOCOAGULATION WITH LASER Right 09/26/2018   Procedure: Photocoagulation With Laser;  Surgeon: Valdemar Rogue, MD;  Location: [facility] OR;  Service: Ophthalmology;  Laterality: Right;   SCLERAL BUCKLE Right 09/26/2018   Procedure: Scleral Buckle;  Surgeon: Valdemar Rogue, MD;  Location: [facility] OR;  Service: Ophthalmology;  Laterality: Right;   TOTAL HIP ARTHROPLASTY Right 02/16/2022   VITRECTOMY 25 GAUGE WITH SCLERAL BUCKLE Right 09/26/2018   Procedure: VITRECTOMY 25 GAUGE;  Surgeon: Valdemar Rogue, MD;  Location: [facility] OR;  Service: Ophthalmology;  Laterality: Right;    Social History   Socioeconomic History   Marital status: Legally Separated    Spouse name: Not on file   Number of children: Not on file   Years of education: Not on file   Highest education level: Bachelor's degree (e.g., BA, AB, BS)  Occupational History   Occupation: Insuarnce agent  Tobacco Use   Smoking status: Some Days    Current packs/day: 0.00    Average packs/day: 1 pack/day for 18.0 years (18.0 ttl pk-yrs)    Types: Cigars, Cigarettes    Start date: 08/22/1970    Last attempt  to quit: 08/22/1988    Years since quitting: 35.6   Smokeless tobacco: Never  Vaping Use   Vaping status: Never Used  Substance and Sexual Activity   Alcohol use: Yes    Alcohol/week: 14.0 standard drinks of alcohol    Types: 14 Standard drinks or equivalent per week    Comment: social   Drug use: Never   Sexual activity: Not Currently    Partners: Female  Other Topics Concern   Not on file  Social History Narrative   Not on file   Social Drivers of Health   Financial Resource Strain: Low Risk  (04/07/2024)   Overall  Financial Resource Strain (CARDIA)    Difficulty of Paying Living Expenses: Not hard at all  Food Insecurity: No Food Insecurity (04/07/2024)   Hunger Vital Sign    Worried About Running Out of Food in the Last Year: Never true    Ran Out of Food in the Last Year: Never true  Transportation Needs: Unknown (04/07/2024)   PRAPARE - Administrator, Civil Service (Medical): No    Lack of Transportation (Non-Medical): Not on file  Physical Activity: Insufficiently Active (04/07/2024)   Exercise Vital Sign    Days of Exercise per Week: 2 days    Minutes of Exercise per Session: 20 min  Stress: No Stress Concern Present (04/07/2024)   [employer] of Occupational Health - Occupational Stress Questionnaire    Feeling of Stress: Only a little  Social Connections: Unknown (04/07/2024)   Social Connection and Isolation Panel    Frequency of Communication with Friends and Family: More than three times a week    Frequency of Social Gatherings with Friends and Family: Twice a week    Attends Religious Services: More than 4 times per year    Active Member of [employer] or Organizations: Yes    Attends Banker Meetings: 1 to 4 times per year    Marital Status: Not on file  Intimate Partner Violence: Not At Risk (03/08/2023)   Humiliation, Afraid, Rape, and Kick questionnaire    Fear of Current or Ex-Partner: No    Emotionally Abused: No    Physically Abused: No    Sexually Abused: No    Family History  Problem Relation Age of Onset   Cancer Mother        bladder CA   Hypertension Mother    Hypertension Father    Heart disease Father    Heart disease Maternal Grandfather    Hypertension Maternal Grandfather    Cancer Paternal Grandmother        ovarian CA   Heart disease Paternal Grandfather    Hypertension Paternal Grandfather    Colon cancer Neg Hx     Current Outpatient Medications on File Prior to Visit  Medication Sig Dispense Refill   alclomethasone  (ACLOVATE ) 0.05 % cream Apply topically 2 (two) times daily as needed (Rash). 180 g 3   alfuzosin (UROXATRAL) 10 MG 24 hr tablet Take 10 mg by mouth daily.     allopurinol  (ZYLOPRIM ) 100 MG tablet TAKE 2 TABLETS BY MOUTH DAILY 180 tablet 0   aspirin EC 81 MG tablet Take 81 mg by mouth daily. Swallow whole.     atorvastatin  (LIPITOR) 20 MG tablet Take 1 tablet (20 mg total) by mouth daily. 90 tablet 3   clobetasol  (OLUX ) 0.05 % topical foam APPLY TO AFFECTED AREA(S) TWO TIMES A DAY (Patient taking differently: as needed.) 50 g 4  clobetasol  (OLUX ) 0.05 % topical foam Apply topically 2 (two) times daily. 50 g 1   colchicine  0.6 MG tablet TAKE 1 TABLET BY MOUTH 2 TIMES A DAY 15 tablet 1   dorzolamide -timolol  (COSOPT ) 2-0.5 % ophthalmic solution Place 1 drop into both eyes daily. Pt needs to call and schedule appt for additional refills MS 10 mL 0   famotidine (PEPCID) 20 MG tablet Take 20 mg by mouth daily.     hydrochlorothiazide  (HYDRODIURIL ) 25 MG tablet TAKE 1 TABLET(25 MG) BY MOUTH DAILY 90 tablet 0   indomethacin  (INDOCIN ) 25 MG capsule Take 1-2 capsules by mouth every 8 hours with food as needed for gout flare. 60 capsule 1   ketoconazole  (NIZORAL ) 2 % cream APPLY 1 APPLICATION TOPICALLY IN THE MORNING AND AT BEDTIME 60 g 1   lisinopril  (ZESTRIL ) 20 MG tablet TAKE 1 TABLET(20 MG) BY MOUTH DAILY 90 tablet 2   metoprolol  succinate (TOPROL -XL) 50 MG 24 hr tablet Take 1 tablet (50 mg total) by mouth daily. Take with or immediately following a meal. 90 tablet 3   Multiple Vitamin (MULTIVITAMIN) tablet Take 1 tablet by mouth daily.     sildenafil  (VIAGRA ) 100 MG tablet TAKE HALF TABLET TO 1 TABLET BY MOUTH AS NEEDED FOR ERECTILE DYSFUNCTION.... ** THIS IS NOT A DAILY MEDICINE 30 tablet 1   tolterodine (DETROL LA) 4 MG 24 hr capsule Take 4 mg by mouth daily.     UNABLE TO FIND Take 1 tablet by mouth daily. Med Name: Relief Factor.     glucosamine-chondroitin 500-400 MG tablet Take 1 tablet by mouth  daily. (Patient not taking: Reported on 04/08/2024)     No current facility-administered medications on file prior to visit.    Allergies  Allergen Reactions   Penicillins     REACTION: hives       Physical Exam Vitals requested from patient and listed below if patient had equipment and was able to obtain at home for this virtual visit: Vitals:   04/08/24 1049  BP: (!) 141/74  Pulse: (!) 55   Estimated body mass index is 36.65 kg/m as calculated from the following:   Height as of 12/26/22: 5' 9 (1.753 m).   Weight as of 10/12/23: 248 lb 3.2 oz (112.6 kg).  EKG (optional): deferred due to virtual visit  GENERAL: alert, oriented, no acute distress detected; full vision exam deferred due to pandemic and/or virtual encounter  HEENT: atraumatic, conjunttiva clear, no obvious abnormalities on inspection of external nose and ears  NECK: normal movements of the head and neck  LUNGS: on inspection no signs of respiratory distress, breathing rate appears normal, no obvious gross SOB, gasping or wheezing  CV: no obvious cyanosis  MS: moves all visible extremities without noticeable abnormality  PSYCH/NEURO: pleasant and cooperative, no obvious depression or anxiety, speech and thought processing grossly intact, Cognitive function grossly intact  Flowsheet Row Office Visit from [facility]/08/2023 in [facility]  PHQ-9 Total Score 0        04/08/2024   11:23 AM 04/08/2024   10:52 AM 03/08/2023    9:19 AM 03/02/2022   12:46 PM 12/13/2021   11:11 AM  Depression screen PHQ 2/9  Decreased Interest 0 0 0 0 0  Down, Depressed, Hopeless 0 0 0 0 0  PHQ - 2 Score 0 0 0 0 0  Altered sleeping   0    Tired, decreased energy   0    Change in appetite  0    Feeling bad or failure about yourself    0    Trouble concentrating   0    Moving slowly or fidgety/restless   0    Suicidal thoughts   0    PHQ-9 Score   0         03/02/2022   12:29 PM 03/02/2022   12:50  PM 03/08/2023    9:20 AM 04/07/2024   12:35 AM 04/08/2024   10:52 AM  Fall Risk  Falls in the past year? 0 0 0 1 1  Was there an injury with Fall?  0 0 0 0  Fall Risk Category Calculator  0 0 1  1  Fall Risk Category (Retired)  Low      (RETIRED) Patient Fall Risk Level  Low fall risk      Patient at Risk for Falls Due to  No Fall Risks No Fall Risks    Fall risk Follow up   Falls evaluation completed  Falls evaluation completed     Patient-reported   Data saved with a previous flowsheet row definition     SUMMARY AND PLAN:  Medicare annual wellness visit, subsequent   Discussed applicable health maintenance/preventive health measures and advised and referred or ordered per patient preferences: -he thinks he got the Tdap, asked that he bring us  proof of receipt so that we can update -discussed flu covid vaccine recs and risks and he know can get at the pharmacy -Discussed BP. He has appt with Dr. B next week. He agrees to monitor and keep log. Discussed dietary recs for BP and lifestyle recs.  Health Maintenance  Topic Date Due   COVID-19 Vaccine (3 - Moderna risk series) 04/24/2024 (Originally 11/11/2019)   Influenza Vaccine  10/07/2024 (Originally 02/08/2024)   DTaP/Tdap/Td (2 - Tdap) 04/08/2025 (Originally 02/19/2020)   Colonoscopy  03/23/2025   Medicare Annual Wellness (AWV)  04/08/2025   Pneumococcal Vaccine: 50+ Years  Completed   Hepatitis C Screening  Completed   Zoster Vaccines- Shingrix   Completed   HPV VACCINES  Aged Out   Meningococcal B Vaccine  Aged Out     Education and counseling on the following was provided based on the above review of health and a plan/checklist for the patient, along with additional information discussed, was provided for the patient in the patient instructions :   -he denies falls to me -Advised and counseled on a healthy lifestyle  -Reviewed patient's current diet. Advised and counseled on a whole foods based healthy diet. Discussed low  sodium (1500mg  daily) and healthy diet for lowering BP.  A summary of a healthy diet was provided in the Patient Instructions.  -reviewed patient's current physical activity level and discussed exercise guidelines for adults. Discussed community resources and ideas for safe exercise  to assist in meeting exercise guideline recommendations in a safe and healthy way.  -Advise yearly dental visits at minimum and regular eye exams -Advise no more than 2 drinks in any given day.   Follow up: see patient instructions   Patient Instructions  I really enjoyed getting to talk with you today! I am available on Tuesdays and Thursdays for virtual visits if you have any questions or concerns, or if I can be of any further assistance.   CHECKLIST FROM ANNUAL WELLNESS VISIT:  -Follow up (please call to schedule if not scheduled after visit):   -yearly for annual wellness visit with primary care office  Here is a list of your  preventive care/health maintenance measures and the plan for each if any are due:  PLAN For any measures below that may be due:    1. Please provide proof of receipt of Tdap so that we can update your record.   2. Can get the flu and covid vaccines at the pharmacy. Please lt us  know if you do so that we can update your record. Can get the flu shot at the office at your visit if you like.   Health Maintenance  Topic Date Due   COVID-19 Vaccine (3 - Moderna risk series) 04/24/2024 (Originally 11/11/2019)   Influenza Vaccine  10/07/2024 (Originally 02/08/2024)   DTaP/Tdap/Td (2 - Tdap) 04/08/2025 (Originally 02/19/2020)   Colonoscopy  03/23/2025   Medicare Annual Wellness (AWV)  04/08/2025   Pneumococcal Vaccine: 50+ Years  Completed   Hepatitis C Screening  Completed   Zoster Vaccines- Shingrix   Completed   HPV VACCINES  Aged Out   Meningococcal B Vaccine  Aged Out    -See a dentist at least yearly  -Get your eyes checked and then per your eye specialist's  recommendations  -Alcohol: please limit to no more than 2 drinks in any given day. Alcohol can elevate blood pressure in some people.    -I have included below further information regarding a healthy whole foods based diet, physical activity guidelines for adults, stress management and opportunities for social connections. I hope you find this information useful.   -----------------------------------------------------------------------------------------------------------------------------------------------------------------------------------------------------------------------------------------------------------    NUTRITION: -eat real food: lots of colorful vegetables (half the plate) and fruits -5-7 servings of vegetables and fruits per day (fresh or steamed is best), exp. 2 servings of vegetables with lunch and dinner and 2 servings of fruit per day. Berries and greens such as kale and collards are great choices.  -consume on a regular basis:  fresh fruits, fresh veggies, fish, nuts, seeds, healthy oils (such as olive oil, avocado oil), whole grains (make sure for bread/pasta/crackers/etc., that the first ingredient on label contains the word whole), legumes. -can eat small amounts of dairy and lean meat (no larger than the palm of your hand), but avoid processed meats such as ham, bacon, lunch meat, etc. -drink water  -try to avoid fast food and pre-packaged foods, processed meat, ultra processed foods/beverages (donuts, candy, etc.) -most experts advise limiting sodium to < 2300mg  per day, should limit further is any chronic conditions such as high blood pressure, heart disease, diabetes, etc. The American Heart Association advised that < 1500mg  is is ideal -try to avoid foods/beverages that contain any ingredients with names you do not recognize  -try to avoid foods/beverages  with added sugar or sweeteners/sweets  -try to avoid sweet drinks (including diet drinks): soda, juice, Gatorade,  sweet tea, power drinks, diet drinks -try to avoid white rice, white bread, pasta (unless whole grain)  EXERCISE GUIDELINES FOR ADULTS: -if you wish to increase your physical activity, do so gradually and with the approval of your doctor -STOP and seek medical care immediately if you have any chest pain, chest discomfort or trouble breathing when starting or increasing exercise  -move and stretch your body, legs, feet and arms when sitting for long periods -Physical activity guidelines for optimal health in adults: -get at least 150 minutes per week of moderate exercise (can talk, but not sing); this is about 20-30 minutes of sustained activity 5-7 days per week or two 10-15 minute episodes of sustained activity 5-7 days per week -do some muscle building/resistance training/strength training at least 2  days per week  -balance exercises 3+ days per week:   Stand somewhere where you have something sturdy to hold onto if you lose balance    1) lift up on toes, then back down, start with 5x per day and work up to 20x   2) stand and lift one leg straight out to the side so that foot is a few inches of the floor, start with 5x each side and work up to 20x each side   3) stand on one foot, start with 5 seconds each side and work up to 20 seconds on each side  If you need ideas or help with getting more active:  -Silver sneakers https://tools.silversneakers.com  -Walk with a Doc: http://www.duncan-williams.com/  -try to include resistance (weight lifting/strength building) and balance exercises twice per week: or the following link for ideas: http://castillo-powell.com/  BuyDucts.dk  STRESS MANAGEMENT: -can try meditating, or just sitting quietly with deep breathing while intentionally relaxing all parts of your body for 5 minutes daily -if you need further help with stress, anxiety or depression please follow  up with your primary doctor or contact the wonderful folks at [facility]: [phone]  SOCIAL CONNECTIONS: -options in [place] if you wish to engage in more social and exercise related activities:  -Silver sneakers https://tools.silversneakers.com  -Walk with a Doc: http://www.duncan-williams.com/  -Check out the [facility] Active Adults 50+ section on the [place] [employer] (hiking clubs, book clubs, cards and games, chess, exercise classes, aquatic classes and much more) - see the website for details: https://www.[place].gov/departments/parks-recreation/active-adults50  -YouTube has lots of exercise videos for different ages and abilities as well  -[facility] (a variety of indoor and outdoor inperson activities for adults). (228)573-3255. [address]  -Virtual Online Classes (a variety of topics): see seniorplanet.org or call [phone]  -consider volunteering at a school, hospice center, church, senior center or elsewhere            Chiquita JONELLE Cramp, DO

## 2024-04-14 ENCOUNTER — Ambulatory Visit: Admitting: Family Medicine

## 2024-04-14 VITALS — BP 118/62 | HR 53 | Temp 98.4°F | Ht 69.0 in | Wt 251.8 lb

## 2024-04-14 DIAGNOSIS — M25462 Effusion, left knee: Secondary | ICD-10-CM | POA: Diagnosis not present

## 2024-04-14 DIAGNOSIS — K219 Gastro-esophageal reflux disease without esophagitis: Secondary | ICD-10-CM

## 2024-04-14 DIAGNOSIS — I10 Essential (primary) hypertension: Secondary | ICD-10-CM

## 2024-04-14 MED ORDER — PANTOPRAZOLE SODIUM 40 MG PO TBEC: 40.0000 mg | DELAYED_RELEASE_TABLET | Freq: Every day | ORAL | 2 refills | Status: DC

## 2024-04-14 NOTE — Progress Notes (Signed)
 Established Patient Office Visit  Subjective   Patient ID: Samuel [name], male    DOB: 06-27-1951  Age: 73 y.o. MRN: [id number]  Chief Complaint  Patient presents with   Knee Pain    Pt c/o L knee pain. More noticeable in the last month. Pt states feels pain when doing certain things. Been doing physical therapy. When doing leg exercise and started to come down- feelspain. Water  therapy- pains when knee bend.    HPI   Samuel [name] has past medical history significant for hypertension, obesity, nonalcoholic fatty liver disease, osteoarthritis, prediabetes.  He is seen today with left knee effusion and some weakness perhaps in the left knee which was noted recently during water  therapy for his back.  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  He had some recent back difficulties and saw orthopedist and was prescribed therapy.  He noticed when going down steps particularly sensation of left knee giving way.  Also did some recent exercises with leg weights and with knee flexion noticed some mild pain.  Had not noted any obvious swelling.  No warmth.  No erythema.  No history of gout.  He thinks he may have had left knee arthroscopic surgery back in the 1980s  Other issue is frequent GERD symptoms.  He states he frequently takes Pepcid up to 3 sometimes more times per day to control symptoms.  No dysphagia.  Clearly triggered by certain foods but also he has frequently on days that he eats relatively bland.  No weight loss or appetite change.  No reported nausea or vomiting.  He has hypertension treated with lisinopril  and Toprol .  Also takes HCTZ.  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   severe- right anterior shoulder- (EXC)   Atypical nevus 07/18/2011   mild-mod-right upper abdomen   Back pain    Basal cell carcinoma 07/17/2019   nod-left forearm-([id number])   COVID 01/07/2021   EPISTAXIS, RECURRENT  12/21/2009   ESSENTIAL HYPERTENSION 12/21/2009   Hypertension    Joint pain    Other fatigue    Prediabetes    Retinal detachment    OD   Shortness of breath on exertion    VENTRICULAR HYPERTROPHY, LEFT 01/04/2010   Past Surgical History:  Procedure Laterality Date   CATARACT EXTRACTION Bilateral 2009   EYE SURGERY Bilateral 2009   cataract   GAS/FLUID EXCHANGE Right 09/26/2018   Procedure: Gas/Fluid Exchange;  Surgeon: Valdemar Rogue, MD;  Location: [facility] OR;  Service: Ophthalmology;  Laterality: Right;   PHOTOCOAGULATION WITH LASER Right 09/26/2018   Procedure: Photocoagulation With Laser;  Surgeon: Valdemar Rogue, MD;  Location: [facility] OR;  Service: Ophthalmology;  Laterality: Right;   SCLERAL BUCKLE Right 09/26/2018   Procedure: Scleral Buckle;  Surgeon: Valdemar Rogue, MD;  Location: [facility] OR;  Service: Ophthalmology;  Laterality: Right;   TOTAL HIP ARTHROPLASTY Right 02/16/2022   VITRECTOMY 25 GAUGE WITH SCLERAL BUCKLE Right 09/26/2018   Procedure: VITRECTOMY 25 GAUGE;  Surgeon: Valdemar Rogue, MD;  Location: [facility] OR;  Service: Ophthalmology;  Laterality: Right;    reports that he has been smoking cigars and cigarettes. He started smoking about 53 years ago. He has a 18 pack-year smoking history. He has never used smokeless tobacco. He reports current alcohol use of about 14.0 standard drinks of alcohol per week. He reports that he does not use drugs. family history includes Cancer in his mother  and paternal grandmother; Heart disease in his father, maternal grandfather, and paternal grandfather; Hypertension in his father, maternal grandfather, mother, and paternal grandfather. Allergies  Allergen Reactions   Bee Venom Swelling   Penicillins     REACTION: hives     Review of Systems  Constitutional:  Negative for malaise/fatigue.  Eyes:  Negative for blurred vision.  Respiratory:  Negative for shortness of breath.   Cardiovascular:  Negative for chest pain.  Gastrointestinal:   Positive for heartburn.  Neurological:  Negative for dizziness, weakness and headaches.      Objective:     BP 118/62 (BP Location: Right Arm, Patient Position: Sitting, Cuff Size: Large)   Pulse (!) 53   Temp 98.4 F (36.9 C) (Oral)   Ht 5' 9 (1.753 m)   Wt 251 lb 12.8 oz (114.2 kg)   SpO2 95%   BMI 37.18 kg/m  BP Readings from Last 3 Encounters:  04/14/24 118/62  04/08/24 (!) 141/74  10/12/23 (!) 100/56   Wt Readings from Last 3 Encounters:  04/14/24 251 lb 12.8 oz (114.2 kg)  10/12/23 248 lb 3.2 oz (112.6 kg)  03/08/23 238 lb (108 kg)      Physical Exam Vitals reviewed.  Constitutional:      General: He is not in acute distress.    Appearance: He is not ill-appearing.  Cardiovascular:     Rate and Rhythm: Normal rate and regular rhythm.  Musculoskeletal:     Comments: Left knee reveals fairly large effusion.  No warmth.  No localized tenderness.  No medial or lateral joint line tenderness  Neurological:     Mental Status: He is alert.      No results found for any visits on 04/14/24.  Last CBC Lab Results  Component Value Date   WBC 17.4 (H) 12/19/2022   HGB 13.8 12/19/2022   HCT 39.2 12/19/2022   MCV 92.0 12/19/2022   MCH 32.4 12/19/2022   RDW 13.3 12/19/2022   PLT 155 12/19/2022   Last metabolic panel Lab Results  Component Value Date   GLUCOSE 163 (H) 10/12/2023   NA 140 10/12/2023   K 4.1 10/12/2023   CL 101 10/12/2023   CO2 30 10/12/2023   BUN 34 (H) 10/12/2023   CREATININE 1.22 10/12/2023   GFR 59.97 (L) 10/12/2023   CALCIUM  9.8 10/12/2023   PROT 6.7 10/12/2023   ALBUMIN 4.4 10/12/2023   LABGLOB 2.5 12/15/2021   AGRATIO 1.8 12/15/2021   BILITOT 0.9 10/12/2023   ALKPHOS 48 10/12/2023   AST 23 10/12/2023   ALT 19 10/12/2023   ANIONGAP 11 12/19/2022   Last lipids Lab Results  Component Value Date   CHOL 115 10/12/2023   HDL 45.50 10/12/2023   LDLCALC 53 10/12/2023   TRIG 83.0 10/12/2023   CHOLHDL 3 10/12/2023   Last  hemoglobin A1c Lab Results  Component Value Date   HGBA1C 5.7 10/12/2023      The ASCVD Risk score (Arnett DK, et al., 2019) failed to calculate for the following reasons:   The valid total cholesterol range is 130 to 320 mg/dL    Assessment & Plan:   #1 left knee effusion-patient recently noticed some weakness particularly going downstairs with physical therapy.  Duration over a month.  Set up sports medicine referral to further assess  #2 GERD.  Patient gets some control with Pepcid but is having frequent breakthrough.  Does not have any red flags such as weight loss, appetite change, or dysphagia.  Start  Protonix 40 mg once daily.  If symptoms well-controlled in 6 to 8 weeks consider tapering back off  #3 hypertension stable and well-controlled on regimen as above.   No follow-ups on file.    Wolm Scarlet, MD

## 2024-05-01 NOTE — Progress Notes (Unsigned)
   [name], PhD, LAT, ATC acting as a scribe for Artist Lloyd, MD.  Samuel [name] is a 73 y.o. male who presents to [facility] today for L knee pain that's been more noticeable over the last 1.5-2 months. Pt locates pain to ***  L Knee swelling: Mechanical symptoms: Aggravates: Treatments tried:  Pertinent review of systems: ***  Relevant historical information: ***   Exam:  There were no vitals taken for this visit. General: Well Developed, well nourished, and in no acute distress.   MSK: ***    Lab and Radiology Results No results found for this or any previous visit (from the past 72 hours). No results found.     Assessment and Plan: 73 y.o. male with ***   PDMP not reviewed this encounter. No orders of the defined types were placed in this encounter.  No orders of the defined types were placed in this encounter.    Discussed warning signs or symptoms. Please see discharge instructions. Patient expresses understanding.   ***

## 2024-05-02 ENCOUNTER — Ambulatory Visit (INDEPENDENT_AMBULATORY_CARE_PROVIDER_SITE_OTHER): Admitting: Family Medicine

## 2024-05-02 ENCOUNTER — Ambulatory Visit (INDEPENDENT_AMBULATORY_CARE_PROVIDER_SITE_OTHER)

## 2024-05-02 ENCOUNTER — Other Ambulatory Visit: Payer: Self-pay

## 2024-05-02 ENCOUNTER — Encounter: Payer: Self-pay | Admitting: Family Medicine

## 2024-05-02 VITALS — BP 138/82 | HR 61 | Ht 69.0 in | Wt 251.0 lb

## 2024-05-02 DIAGNOSIS — G8929 Other chronic pain: Secondary | ICD-10-CM | POA: Diagnosis not present

## 2024-05-02 DIAGNOSIS — M25562 Pain in left knee: Secondary | ICD-10-CM

## 2024-05-02 DIAGNOSIS — M25462 Effusion, left knee: Secondary | ICD-10-CM | POA: Diagnosis not present

## 2024-05-02 DIAGNOSIS — M1712 Unilateral primary osteoarthritis, left knee: Secondary | ICD-10-CM | POA: Insufficient documentation

## 2024-05-02 NOTE — Patient Instructions (Addendum)
 Thank you for coming in today.   Please get an Xray today before you leave   Recommend Compression  Please use Voltaren gel (Generic Diclofenac Gel) up to 4x daily for pain as needed.  This is available over-the-counter as both the name brand Voltaren gel and the generic diclofenac gel.   Let us  know if you would like to do an injection and aspiration

## 2024-05-05 ENCOUNTER — Ambulatory Visit: Payer: Self-pay | Admitting: Family Medicine

## 2024-05-05 NOTE — Progress Notes (Signed)
 Left knee x-ray shows severe arthritis.

## 2024-06-05 ENCOUNTER — Other Ambulatory Visit (INDEPENDENT_AMBULATORY_CARE_PROVIDER_SITE_OTHER): Payer: Self-pay | Admitting: Ophthalmology

## 2024-06-24 DIAGNOSIS — N400 Enlarged prostate without lower urinary tract symptoms: Secondary | ICD-10-CM | POA: Diagnosis not present

## 2024-06-24 DIAGNOSIS — R350 Frequency of micturition: Secondary | ICD-10-CM | POA: Diagnosis not present

## 2024-06-24 DIAGNOSIS — R3912 Poor urinary stream: Secondary | ICD-10-CM | POA: Diagnosis not present

## 2024-06-24 DIAGNOSIS — N401 Enlarged prostate with lower urinary tract symptoms: Secondary | ICD-10-CM | POA: Diagnosis not present

## 2024-06-24 DIAGNOSIS — R3914 Feeling of incomplete bladder emptying: Secondary | ICD-10-CM | POA: Diagnosis not present

## 2024-06-24 DIAGNOSIS — R351 Nocturia: Secondary | ICD-10-CM | POA: Diagnosis not present

## 2024-06-29 ENCOUNTER — Other Ambulatory Visit: Payer: Self-pay | Admitting: Family Medicine

## 2024-06-30 ENCOUNTER — Encounter: Payer: Self-pay | Admitting: Family Medicine

## 2024-06-30 MED ORDER — HYDROCHLOROTHIAZIDE 25 MG PO TABS: ORAL_TABLET | ORAL | 1 refills | Status: AC

## 2024-07-10 ENCOUNTER — Encounter: Payer: Self-pay | Admitting: Family Medicine

## 2024-07-11 MED ORDER — PANTOPRAZOLE SODIUM 40 MG PO TBEC: 40.0000 mg | DELAYED_RELEASE_TABLET | Freq: Every day | ORAL | 2 refills | Status: AC

## 2024-08-04 ENCOUNTER — Other Ambulatory Visit: Payer: Self-pay | Admitting: Family Medicine
